# Patient Record
Sex: Female | Born: 1955 | Race: Black or African American | Hispanic: No | Marital: Married | Smoking: Never smoker
Health system: Southern US, Community
[De-identification: ages and names within clinical notes are randomized; demographics above are authoritative.]

## PROBLEM LIST (undated history)

## (undated) ENCOUNTER — Emergency Department (HOSPITAL_BASED_OUTPATIENT_CLINIC_OR_DEPARTMENT_OTHER): Admission: EM | Payer: Medicare PPO | Source: Home / Self Care

## (undated) ENCOUNTER — Ambulatory Visit (HOSPITAL_COMMUNITY): Payer: Medicare PPO

## (undated) DIAGNOSIS — F410 Panic disorder [episodic paroxysmal anxiety] without agoraphobia: Secondary | ICD-10-CM

## (undated) DIAGNOSIS — Z8619 Personal history of other infectious and parasitic diseases: Secondary | ICD-10-CM

## (undated) DIAGNOSIS — G43909 Migraine, unspecified, not intractable, without status migrainosus: Secondary | ICD-10-CM

## (undated) DIAGNOSIS — Z972 Presence of dental prosthetic device (complete) (partial): Secondary | ICD-10-CM

## (undated) DIAGNOSIS — B029 Zoster without complications: Secondary | ICD-10-CM

## (undated) DIAGNOSIS — K259 Gastric ulcer, unspecified as acute or chronic, without hemorrhage or perforation: Secondary | ICD-10-CM

## (undated) DIAGNOSIS — E559 Vitamin D deficiency, unspecified: Secondary | ICD-10-CM

## (undated) DIAGNOSIS — K219 Gastro-esophageal reflux disease without esophagitis: Secondary | ICD-10-CM

## (undated) DIAGNOSIS — R51 Headache: Secondary | ICD-10-CM

## (undated) DIAGNOSIS — T7840XA Allergy, unspecified, initial encounter: Secondary | ICD-10-CM

## (undated) DIAGNOSIS — R519 Headache, unspecified: Secondary | ICD-10-CM

## (undated) DIAGNOSIS — F419 Anxiety disorder, unspecified: Secondary | ICD-10-CM

## (undated) DIAGNOSIS — J40 Bronchitis, not specified as acute or chronic: Secondary | ICD-10-CM

## (undated) DIAGNOSIS — J45909 Unspecified asthma, uncomplicated: Secondary | ICD-10-CM

## (undated) HISTORY — DX: Unspecified asthma, uncomplicated: J45.909

## (undated) HISTORY — PX: COLONOSCOPY: SHX174

## (undated) HISTORY — DX: Personal history of other infectious and parasitic diseases: Z86.19

## (undated) HISTORY — DX: Gastric ulcer, unspecified as acute or chronic, without hemorrhage or perforation: K25.9

## (undated) HISTORY — DX: Anxiety disorder, unspecified: F41.9

## (undated) HISTORY — PX: BUNIONECTOMY: SHX129

## (undated) HISTORY — PX: UPPER GI ENDOSCOPY: SHX6162

## (undated) HISTORY — PX: IUD REMOVAL: SHX5392

## (undated) HISTORY — PX: APPENDECTOMY: SHX54

## (undated) HISTORY — DX: Allergy, unspecified, initial encounter: T78.40XA

## (undated) HISTORY — DX: Migraine, unspecified, not intractable, without status migrainosus: G43.909

## (undated) HISTORY — DX: Panic disorder (episodic paroxysmal anxiety): F41.0

## (undated) HISTORY — PX: TUBAL LIGATION: SHX77

## (undated) HISTORY — DX: Vitamin D deficiency, unspecified: E55.9

## (undated) HISTORY — PX: ENDOMETRIAL BIOPSY: SHX622

---

## 1997-09-14 ENCOUNTER — Emergency Department (HOSPITAL_COMMUNITY): Admission: EM | Admit: 1997-09-14 | Discharge: 1997-09-14 | Payer: Self-pay | Admitting: Emergency Medicine

## 2002-01-01 ENCOUNTER — Other Ambulatory Visit: Admission: RE | Admit: 2002-01-01 | Discharge: 2002-01-01 | Payer: Self-pay | Admitting: Obstetrics and Gynecology

## 2003-03-06 DIAGNOSIS — F410 Panic disorder [episodic paroxysmal anxiety] without agoraphobia: Secondary | ICD-10-CM

## 2003-03-06 HISTORY — DX: Panic disorder (episodic paroxysmal anxiety): F41.0

## 2004-01-13 ENCOUNTER — Other Ambulatory Visit: Admission: RE | Admit: 2004-01-13 | Discharge: 2004-01-13 | Payer: Self-pay | Admitting: Obstetrics and Gynecology

## 2004-05-17 ENCOUNTER — Ambulatory Visit: Payer: Self-pay | Admitting: Internal Medicine

## 2004-05-19 ENCOUNTER — Ambulatory Visit: Payer: Self-pay | Admitting: Internal Medicine

## 2004-06-08 ENCOUNTER — Ambulatory Visit: Payer: Self-pay | Admitting: Internal Medicine

## 2006-04-18 ENCOUNTER — Inpatient Hospital Stay (HOSPITAL_COMMUNITY): Admission: AD | Admit: 2006-04-18 | Discharge: 2006-04-18 | Payer: Self-pay | Admitting: Obstetrics and Gynecology

## 2008-05-14 ENCOUNTER — Emergency Department (HOSPITAL_COMMUNITY): Admission: EM | Admit: 2008-05-14 | Discharge: 2008-05-14 | Payer: Self-pay | Admitting: Emergency Medicine

## 2008-11-28 ENCOUNTER — Ambulatory Visit: Payer: Self-pay | Admitting: Family Medicine

## 2008-11-28 ENCOUNTER — Observation Stay (HOSPITAL_COMMUNITY): Admission: EM | Admit: 2008-11-28 | Discharge: 2008-11-29 | Payer: Self-pay | Admitting: Emergency Medicine

## 2009-06-10 ENCOUNTER — Ambulatory Visit (HOSPITAL_COMMUNITY): Admission: RE | Admit: 2009-06-10 | Discharge: 2009-06-10 | Payer: Self-pay | Admitting: Obstetrics and Gynecology

## 2010-05-08 ENCOUNTER — Other Ambulatory Visit (HOSPITAL_COMMUNITY): Payer: Self-pay | Admitting: Obstetrics and Gynecology

## 2010-05-08 DIAGNOSIS — Z1231 Encounter for screening mammogram for malignant neoplasm of breast: Secondary | ICD-10-CM

## 2010-06-09 LAB — CBC
HCT: 36.7 % (ref 36.0–46.0)
HCT: 37.8 % (ref 36.0–46.0)
Hemoglobin: 12.3 g/dL (ref 12.0–15.0)
MCHC: 33.2 g/dL (ref 30.0–36.0)
MCHC: 33.5 g/dL (ref 30.0–36.0)
MCV: 83.6 fL (ref 78.0–100.0)
MCV: 84.6 fL (ref 78.0–100.0)
Platelets: 306 10*3/uL (ref 150–400)
Platelets: 332 10*3/uL (ref 150–400)
RBC: 4.52 MIL/uL (ref 3.87–5.11)
RDW: 15.3 % (ref 11.5–15.5)
RDW: 15.5 % (ref 11.5–15.5)
WBC: 8.1 10*3/uL (ref 4.0–10.5)
WBC: 8.4 10*3/uL (ref 4.0–10.5)

## 2010-06-09 LAB — BASIC METABOLIC PANEL
Calcium: 8.7 mg/dL (ref 8.4–10.5)
Calcium: 8.7 mg/dL (ref 8.4–10.5)
Chloride: 105 mEq/L (ref 96–112)
Creatinine, Ser: 0.72 mg/dL (ref 0.4–1.2)
GFR calc non Af Amer: >60 mL/min (ref >60)
GFR calc non Af Amer: >60 mL/min (ref >60)
Glucose, Bld: 99 mg/dL (ref 70–99)

## 2010-06-09 LAB — LIPID PANEL
Cholesterol: 154 mg/dL (ref 0–200)
LDL Cholesterol: 110 mg/dL — ABNORMAL HIGH (ref 0–99)
Total CHOL/HDL Ratio: 4.7 RATIO
Triglycerides: 54 mg/dL (ref <150)

## 2010-06-09 LAB — CARDIAC PANEL(CRET KIN+CKTOT+MB+TROPI)
CK, MB: 0.5 ng/mL (ref 0.3–4.0)
CK, MB: 0.8 ng/mL (ref 0.3–4.0)
Total CK: 76 U/L (ref 7–177)
Total CK: 90 U/L (ref 7–177)

## 2010-06-09 LAB — DIFFERENTIAL
Eosinophils Relative: 1 % (ref 0–5)
Lymphs Abs: 1.8 10*3/uL (ref 0.7–4.0)
Neutro Abs: 5.8 10*3/uL (ref 1.7–7.7)
Neutrophils Relative %: 71 % (ref 43–77)

## 2010-06-09 LAB — CK TOTAL AND CKMB (NOT AT ARMC)
CK, MB: 0.9 ng/mL (ref 0.3–4.0)
Relative Index: INVALID (ref 0.0–2.5)
Total CK: 87 U/L (ref 7–177)

## 2010-06-09 LAB — POCT CARDIAC MARKERS
Myoglobin, poc: 55.3 ng/mL (ref 12–200)
Troponin i, poc: <0.05 ng/mL (ref 0.00–0.09)

## 2010-06-12 ENCOUNTER — Ambulatory Visit (HOSPITAL_COMMUNITY)
Admission: RE | Admit: 2010-06-12 | Discharge: 2010-06-12 | Disposition: A | Discharge status: Home / Self Care | Payer: BC Managed Care – PPO | Source: Ambulatory Visit | Attending: Obstetrics and Gynecology | Admitting: Obstetrics and Gynecology

## 2010-06-12 DIAGNOSIS — Z1231 Encounter for screening mammogram for malignant neoplasm of breast: Secondary | ICD-10-CM | POA: Insufficient documentation

## 2010-07-20 ENCOUNTER — Emergency Department (HOSPITAL_COMMUNITY)
Admission: EM | Admit: 2010-07-20 | Discharge: 2010-07-20 | Disposition: A | Discharge status: Home / Self Care | Payer: BC Managed Care – PPO | Attending: Emergency Medicine | Admitting: Emergency Medicine

## 2010-07-20 ENCOUNTER — Emergency Department (HOSPITAL_COMMUNITY): Payer: BC Managed Care – PPO

## 2010-07-20 DIAGNOSIS — R110 Nausea: Secondary | ICD-10-CM | POA: Insufficient documentation

## 2010-07-20 DIAGNOSIS — R071 Chest pain on breathing: Secondary | ICD-10-CM | POA: Insufficient documentation

## 2010-07-20 LAB — DIFFERENTIAL
Basophils Absolute: 0 10*3/uL (ref 0.0–0.1)
Neutro Abs: 6.6 10*3/uL (ref 1.7–7.7)
Neutrophils Relative %: 69 % (ref 43–77)

## 2010-07-20 LAB — COMPREHENSIVE METABOLIC PANEL
ALT: 19 U/L (ref 0–35)
Albumin: 3.7 g/dL (ref 3.5–5.2)
CO2: 29 mEq/L (ref 19–32)
Calcium: 9.5 mg/dL (ref 8.4–10.5)
Chloride: 100 mEq/L (ref 96–112)
Creatinine, Ser: 0.56 mg/dL (ref 0.4–1.2)
GFR calc Af Amer: >60 mL/min (ref >60)
GFR calc non Af Amer: >60 mL/min (ref >60)
Glucose, Bld: 89 mg/dL (ref 70–99)
Sodium: 137 mEq/L (ref 135–145)

## 2010-07-20 LAB — POCT CARDIAC MARKERS: Troponin i, poc: <0.05 ng/mL (ref 0.00–0.09)

## 2010-07-20 LAB — CBC
Platelets: 354 10*3/uL (ref 150–400)
RBC: 4.79 MIL/uL (ref 3.87–5.11)
RDW: 14.7 % (ref 11.5–15.5)

## 2010-11-28 ENCOUNTER — Emergency Department (HOSPITAL_COMMUNITY): Payer: BC Managed Care – PPO

## 2010-11-28 ENCOUNTER — Emergency Department (HOSPITAL_COMMUNITY)
Admission: EM | Admit: 2010-11-28 | Discharge: 2010-11-28 | Disposition: A | Discharge status: Home / Self Care | Payer: BC Managed Care – PPO | Attending: Emergency Medicine | Admitting: Emergency Medicine

## 2010-11-28 DIAGNOSIS — R109 Unspecified abdominal pain: Secondary | ICD-10-CM | POA: Insufficient documentation

## 2010-11-28 DIAGNOSIS — R112 Nausea with vomiting, unspecified: Secondary | ICD-10-CM | POA: Insufficient documentation

## 2010-11-28 DIAGNOSIS — N200 Calculus of kidney: Secondary | ICD-10-CM | POA: Insufficient documentation

## 2010-11-28 LAB — URINALYSIS, ROUTINE W REFLEX MICROSCOPIC
Bilirubin Urine: NEGATIVE
Glucose, UA: NEGATIVE mg/dL
Ketones, ur: NEGATIVE mg/dL
Leukocytes, UA: NEGATIVE
Protein, ur: NEGATIVE mg/dL
pH: 8.5 — ABNORMAL HIGH (ref 5.0–8.0)

## 2010-11-28 LAB — DIFFERENTIAL
Basophils Relative: 0 % (ref 0–1)
Monocytes Absolute: 0.5 10*3/uL (ref 0.1–1.0)
Monocytes Relative: 4 % (ref 3–12)
Neutro Abs: 9 10*3/uL — ABNORMAL HIGH (ref 1.7–7.7)

## 2010-11-28 LAB — CBC
HCT: 38.6 % (ref 36.0–46.0)
Hemoglobin: 12.7 g/dL (ref 12.0–15.0)
MCH: 27.4 pg (ref 26.0–34.0)
RDW: 15.1 % (ref 11.5–15.5)

## 2010-11-28 LAB — POCT I-STAT, CHEM 8
BUN: 5 mg/dL — ABNORMAL LOW (ref 6–23)
Calcium, Ion: 1.26 mmol/L (ref 1.12–1.32)
Chloride: 104 mEq/L (ref 96–112)
Creatinine, Ser: 0.9 mg/dL (ref 0.50–1.10)
Glucose, Bld: 89 mg/dL (ref 70–99)
TCO2: 27 mmol/L (ref 0–100)

## 2010-11-28 LAB — URINE MICROSCOPIC-ADD ON

## 2010-11-28 MED ORDER — IOHEXOL 300 MG/ML  SOLN
80.0000 mL | Freq: Once | INTRAMUSCULAR | Status: AC | PRN
  Administered 2010-11-28: 80 mL via INTRAVENOUS

## 2011-07-04 ENCOUNTER — Ambulatory Visit: Payer: Self-pay | Admitting: Obstetrics and Gynecology

## 2011-07-12 ENCOUNTER — Encounter: Payer: Self-pay | Admitting: Obstetrics and Gynecology

## 2011-07-12 ENCOUNTER — Ambulatory Visit (INDEPENDENT_AMBULATORY_CARE_PROVIDER_SITE_OTHER): Payer: BC Managed Care – PPO | Admitting: Obstetrics and Gynecology

## 2011-07-12 VITALS — BP 122/64 | Ht 64.0 in | Wt 202.0 lb

## 2011-07-12 DIAGNOSIS — L909 Atrophic disorder of skin, unspecified: Secondary | ICD-10-CM

## 2011-07-12 DIAGNOSIS — Z139 Encounter for screening, unspecified: Secondary | ICD-10-CM

## 2011-07-12 DIAGNOSIS — Z01419 Encounter for gynecological examination (general) (routine) without abnormal findings: Secondary | ICD-10-CM

## 2011-07-12 DIAGNOSIS — L918 Other hypertrophic disorders of the skin: Secondary | ICD-10-CM

## 2011-07-12 DIAGNOSIS — Z124 Encounter for screening for malignant neoplasm of cervix: Secondary | ICD-10-CM

## 2011-07-12 LAB — TSH: TSH: 1.903 u[IU]/mL (ref 0.350–4.500)

## 2011-07-12 LAB — CBC
MCH: 26.8 pg (ref 26.0–34.0)
MCHC: 31.3 g/dL (ref 30.0–36.0)
MCV: 85.6 fL (ref 78.0–100.0)
Platelets: 385 10*3/uL (ref 150–400)
RBC: 4.66 MIL/uL (ref 3.87–5.11)

## 2011-07-12 NOTE — Progress Notes (Signed)
Last Pap: 07/2010  WNL: Yes Regular Periods:no Contraception: None   Monthly Breast exam:yes Tetanus<46yrs:yes Nl.Bladder Function:yes Daily BMs:yes Healthy Diet:yes Calcium:yes Mammogram:no Exercise:no Seatbelt: yes Abuse at home: no Stressful work:yes Sigmoid-colonoscopy: yesWNL Bone Density: No  C/o skin tags on perineum, wants them removed and c/o wt gain.  Filed Vitals:   07/12/11 1445  BP: 122/64   ROS: noncontributory  Physical Examination: General appearance - alert, well appearing, and in no distress Neck - supple, no significant adenopathy Chest - clear to auscultation, no wheezes, rales or rhonchi, symmetric air entry Heart - normal rate and regular rhythm Abdomen - soft, nontender, nondistended, no masses or organomegaly Breasts - breasts appear normal, no suspicious masses, no skin or nipple changes or axillary nodes Pelvic - normal external genitalia, vulva, vagina, cervix, uterus and adnexa, skin tags on rt buttock x 2 Back exam - no CVAT Extremities - no edema, redness or tenderness in the calves or thighs  A/P Pap sched f/u for removal of skin tags Wt loss discussed  Check labs - TSH, CBC, Vit D today Lab visit for Fasting glucose and lipid panel sched mammo

## 2011-07-16 LAB — PAP IG W/ RFLX HPV ASCU

## 2011-07-18 ENCOUNTER — Other Ambulatory Visit: Payer: BC Managed Care – PPO

## 2011-07-19 ENCOUNTER — Other Ambulatory Visit: Payer: Self-pay

## 2011-07-19 ENCOUNTER — Telehealth: Payer: Self-pay

## 2011-07-19 DIAGNOSIS — D72829 Elevated white blood cell count, unspecified: Secondary | ICD-10-CM

## 2011-07-19 NOTE — Telephone Encounter (Signed)
Pt was called and given Vit-D protocol. Pt was also advised of Elevated WBC and advised to return in 4 wks for re-draw. RX was called to [facility] pharmacy [phone] for Vit-D soft jels 50,000 units 1 cap 1 x wk  For 12 wks #20 0rf. Future labs will be ordered. Kelly Abbott

## 2011-07-25 ENCOUNTER — Ambulatory Visit (INDEPENDENT_AMBULATORY_CARE_PROVIDER_SITE_OTHER): Payer: BC Managed Care – PPO | Admitting: Obstetrics and Gynecology

## 2011-07-25 ENCOUNTER — Other Ambulatory Visit: Payer: BC Managed Care – PPO

## 2011-07-25 ENCOUNTER — Encounter: Payer: Self-pay | Admitting: Obstetrics and Gynecology

## 2011-07-25 VITALS — BP 106/64 | Ht 64.0 in | Wt 201.0 lb

## 2011-07-25 DIAGNOSIS — D72829 Elevated white blood cell count, unspecified: Secondary | ICD-10-CM

## 2011-07-25 DIAGNOSIS — L909 Atrophic disorder of skin, unspecified: Secondary | ICD-10-CM

## 2011-07-25 DIAGNOSIS — L918 Other hypertrophic disorders of the skin: Secondary | ICD-10-CM

## 2011-07-25 DIAGNOSIS — Z139 Encounter for screening, unspecified: Secondary | ICD-10-CM

## 2011-07-25 LAB — CBC
HCT: 38 % (ref 36.0–46.0)
Platelets: 411 10*3/uL — ABNORMAL HIGH (ref 150–400)
RBC: 4.62 MIL/uL (ref 3.87–5.11)
RDW: 14.9 % (ref 11.5–15.5)
WBC: 8.4 10*3/uL (ref 4.0–10.5)

## 2011-07-25 LAB — LIPID PANEL
HDL: 45 mg/dL (ref >39)
LDL Cholesterol: 110 mg/dL — ABNORMAL HIGH (ref 0–99)
Total CHOL/HDL Ratio: 3.8 Ratio

## 2011-07-25 NOTE — Progress Notes (Signed)
Patient here for removal of skin tags on her perineum She also reports a history of elevated white blood cell count from her last visit  Filed Vitals:   07/25/11 0956  BP: 106/64   Perineum was prepped and injected with a total of approximately 2-3 cc of 1% for lidocaine The skin tags were excised and sent to pathology One was approximately 5 mm and the other b/n 5mm and 1cm  Assessment and plan Procedure tolerated well We check CBC at next visit Return to office in 2 weeks for followup

## 2011-07-25 NOTE — Progress Notes (Signed)
Addended by: Marla Roe A on: 07/25/2011 12:40 PM   Modules accepted: Orders

## 2011-07-26 ENCOUNTER — Telehealth: Payer: Self-pay

## 2011-07-26 NOTE — Progress Notes (Signed)
Dr. Su Hilt, protocol for Vit-d has already been done and pt does not have a PCP to forward Vit-d results to. Kelly Abbott

## 2011-07-26 NOTE — Telephone Encounter (Signed)
Given protocol to send Vit-d results to pt's PCP, pt stated that she does not have a PCP. Kelly Abbott

## 2011-07-27 LAB — PATHOLOGY

## 2011-08-13 DIAGNOSIS — E559 Vitamin D deficiency, unspecified: Secondary | ICD-10-CM | POA: Insufficient documentation

## 2011-08-13 DIAGNOSIS — F410 Panic disorder [episodic paroxysmal anxiety] without agoraphobia: Secondary | ICD-10-CM | POA: Insufficient documentation

## 2011-08-14 ENCOUNTER — Other Ambulatory Visit: Payer: BC Managed Care – PPO

## 2011-08-14 ENCOUNTER — Encounter: Payer: BC Managed Care – PPO | Admitting: Obstetrics and Gynecology

## 2012-05-01 HISTORY — PX: OTHER SURGICAL HISTORY: SHX169

## 2012-05-01 HISTORY — PX: BUNIONECTOMY: SHX129

## 2012-05-29 ENCOUNTER — Encounter: Payer: Self-pay | Admitting: *Deleted

## 2012-06-03 ENCOUNTER — Ambulatory Visit (INDEPENDENT_AMBULATORY_CARE_PROVIDER_SITE_OTHER): Payer: BC Managed Care – PPO | Admitting: Podiatry

## 2012-06-03 ENCOUNTER — Encounter: Payer: Self-pay | Admitting: Podiatry

## 2012-06-03 VITALS — BP 123/75 | HR 76 | Ht 63.5 in | Wt 205.0 lb

## 2012-06-03 DIAGNOSIS — M21969 Unspecified acquired deformity of unspecified lower leg: Secondary | ICD-10-CM

## 2012-06-03 DIAGNOSIS — M21961 Unspecified acquired deformity of right lower leg: Secondary | ICD-10-CM

## 2012-06-03 NOTE — Progress Notes (Signed)
5 weeks post op. S/P Biplane Austine right. Patient feels tenderness under 1st MPJ area.  This is normal since tight plantar capsule has been released and metatarsal head was plantar flexed.  Incision is well healed and coapt. Minimum edema. Range of motion is decreased since last visit. Need to continue exercise joint to increase mobility. Will do final post op x-ray on next visit.  Return in 3 weeks.

## 2012-06-03 NOTE — Patient Instructions (Addendum)
5 weeks since surgery. Progressing well. Still need to exercise joint up and down daily. Use cream over incision area. Keep compression stockinet during the day. Stay in Surgical shoe or in Tennis shoe. Return in 3 weeks.

## 2012-06-24 ENCOUNTER — Ambulatory Visit (INDEPENDENT_AMBULATORY_CARE_PROVIDER_SITE_OTHER): Payer: BC Managed Care – PPO | Admitting: Podiatry

## 2012-06-24 VITALS — BP 129/74 | HR 79 | Ht 64.0 in | Wt 202.0 lb

## 2012-06-24 DIAGNOSIS — M21969 Unspecified acquired deformity of unspecified lower leg: Secondary | ICD-10-CM

## 2012-06-24 DIAGNOSIS — M25579 Pain in unspecified ankle and joints of unspecified foot: Secondary | ICD-10-CM

## 2012-06-24 DIAGNOSIS — M21961 Unspecified acquired deformity of right lower leg: Secondary | ICD-10-CM

## 2012-06-24 DIAGNOSIS — M25571 Pain in right ankle and joints of right foot: Secondary | ICD-10-CM

## 2012-06-24 DIAGNOSIS — Z9889 Other specified postprocedural states: Secondary | ICD-10-CM

## 2012-06-24 NOTE — Progress Notes (Signed)
Subjective:  8 weeks post bunionectomy right.  Patient has been in Tennis shoes x 2 weeks. Been back to work. Has sitting job. Does not do exercise.   Objective:  1st MPJ RON limited right. No edema noted.  Need to exercise joint range of motion more actively.  8 week post op Right foot X-ray done. Finding is consistent with surgery performed. First ray is in rectus, with maintained correction. Osteotomy site in good approximation. Internal fixation screw and pin in place.   Assessment:  Satisfactory post op progress from bunion surgery right foot with good bone healing.   Plan: Will have final check in 1 month.  Do more active range of motion of the first MPJ as well as walking exercise.

## 2012-07-23 ENCOUNTER — Ambulatory Visit (INDEPENDENT_AMBULATORY_CARE_PROVIDER_SITE_OTHER): Payer: BC Managed Care – PPO | Admitting: Podiatry

## 2012-07-23 DIAGNOSIS — Z9889 Other specified postprocedural states: Secondary | ICD-10-CM

## 2012-07-24 NOTE — Progress Notes (Signed)
Status post 3 months Bunion surgery. Patient is doing well with some occasional tenderness under the big joint. O: Rectus foot with good and improved range of motion at the first MPJ. Well healed surgical sound.  A:  Satisfactory wound healing with occasional tenderness under 1st MPJ due to plantar capsular release. P:  Reviewed clinical findings. Patient is to continue to increase activity. Return as needed.

## 2012-09-02 ENCOUNTER — Ambulatory Visit (INDEPENDENT_AMBULATORY_CARE_PROVIDER_SITE_OTHER): Payer: BC Managed Care – PPO | Admitting: Internal Medicine

## 2012-09-02 ENCOUNTER — Ambulatory Visit: Payer: BC Managed Care – PPO

## 2012-09-02 VITALS — BP 131/83 | HR 73 | Temp 98.0°F | Resp 16 | Ht 64.0 in | Wt 205.0 lb

## 2012-09-02 DIAGNOSIS — M5412 Radiculopathy, cervical region: Secondary | ICD-10-CM

## 2012-09-02 DIAGNOSIS — R071 Chest pain on breathing: Secondary | ICD-10-CM

## 2012-09-02 DIAGNOSIS — Z6835 Body mass index (BMI) 35.0-35.9, adult: Secondary | ICD-10-CM | POA: Insufficient documentation

## 2012-09-02 DIAGNOSIS — R0789 Other chest pain: Secondary | ICD-10-CM

## 2012-09-02 MED ORDER — MELOXICAM 15 MG PO TABS: 15.0000 mg | ORAL_TABLET | Freq: Every day | ORAL | Status: DC

## 2012-09-02 MED ORDER — PREDNISONE 20 MG PO TABS: 20.0000 mg | ORAL_TABLET | Freq: Every day | ORAL | Status: DC

## 2012-09-02 MED ORDER — CYCLOBENZAPRINE HCL 10 MG PO TABS: 10.0000 mg | ORAL_TABLET | Freq: Every day | ORAL | Status: DC

## 2012-09-02 NOTE — Progress Notes (Addendum)
  Subjective:    Patient ID: Kelly Abbott, female    DOB: 1955-06-01, 57 y.o.   MRN: [id number]  HPI  Presents with chest pain and left arm pain x 3 days. Located in left chest, entire length left arm, and left side of neck. Pain is constant, described as "deep in my bones", keeps her awake at night. Occasionally shooting pain radiates down the arm and down the spine.This morning pain started in right arm as well. Occasional numbness/tingling in hands/fingers. States she feels weaker in her arms than previously. Was evaluated for similar problem "several years ago" at [facility] and sent to ED. ED believes anxiety was cause. Denies: diaphoresis, dizziness, SOB. Moving shoulder/neck can worsen the pain.   Denies hx of HTN, dyslipidemia, DM. Has multiple 1st degree family members with known heart disease, MIs.   Review of Systems Denies headache, SOB, abdominal pain, swelling in extremities.     Objective:   Physical Exam  BP 131/83  Pulse 73  Temp(Src) 98 F (36.7 C) (Oral)  Resp 16  Ht 5\' 4"  (1.626 m)  Wt 205 lb (92.987 kg)  BMI 35.17 kg/m2  SpO2 98%  General: WDWN female, appears stated age, NAD HEENT: normocephalic, atraumatic  Resp: CTA bilaterally, good air entry, without rales rhonchi wheezes Cardiac: RRR, no murmurs rubs gallops, chest wall pain is reproducible with light palpation  Extremities: neck extension causes pain, shoulder ROM causes pain, 5/5 strength in UE & LE, reflexes 2+ biceps and patellar bilaterally Neck with a kyphotic posture exaggeration and with restricted range of motion secondary to discomfort Neuro: A&O x 3, CN II-XII grossly intact/D. Reflexes symmetrical in the upper extremities Skin: no rashes lesions      [facility] reading (PRIMARY) by  Dr. 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 Kelly Abbott, M.D.

## 2012-09-22 ENCOUNTER — Other Ambulatory Visit: Payer: Self-pay | Admitting: Obstetrics and Gynecology

## 2012-09-22 DIAGNOSIS — Z1231 Encounter for screening mammogram for malignant neoplasm of breast: Secondary | ICD-10-CM

## 2012-10-01 ENCOUNTER — Ambulatory Visit (HOSPITAL_COMMUNITY)
Admission: RE | Admit: 2012-10-01 | Discharge: 2012-10-01 | Disposition: A | Discharge status: Home / Self Care | Payer: BC Managed Care – PPO | Source: Ambulatory Visit | Attending: Obstetrics and Gynecology | Admitting: Obstetrics and Gynecology

## 2012-10-01 DIAGNOSIS — Z1231 Encounter for screening mammogram for malignant neoplasm of breast: Secondary | ICD-10-CM | POA: Insufficient documentation

## 2012-10-31 ENCOUNTER — Telehealth: Payer: Self-pay | Admitting: *Deleted

## 2012-10-31 MED ORDER — IBUPROFEN 800 MG PO TABS: 800.0000 mg | ORAL_TABLET | Freq: Three times a day (TID) | ORAL | Status: DC | PRN

## 2012-10-31 NOTE — Telephone Encounter (Signed)
Pt stated her foot was swollen for last two days and requested Ibuprofen refill to be called in.

## 2012-12-01 ENCOUNTER — Ambulatory Visit (INDEPENDENT_AMBULATORY_CARE_PROVIDER_SITE_OTHER): Payer: BC Managed Care – PPO | Admitting: Emergency Medicine

## 2012-12-01 ENCOUNTER — Ambulatory Visit: Payer: BC Managed Care – PPO

## 2012-12-01 VITALS — BP 140/80 | HR 78 | Temp 98.3°F | Resp 16 | Ht 64.0 in | Wt 207.0 lb

## 2012-12-01 DIAGNOSIS — M549 Dorsalgia, unspecified: Secondary | ICD-10-CM

## 2012-12-01 DIAGNOSIS — M5412 Radiculopathy, cervical region: Secondary | ICD-10-CM

## 2012-12-01 DIAGNOSIS — S139XXA Sprain of joints and ligaments of unspecified parts of neck, initial encounter: Secondary | ICD-10-CM

## 2012-12-01 MED ORDER — CYCLOBENZAPRINE HCL 10 MG PO TABS: 10.0000 mg | ORAL_TABLET | Freq: Every day | ORAL | Status: DC

## 2012-12-01 MED ORDER — NAPROXEN SODIUM 550 MG PO TABS: 550.0000 mg | ORAL_TABLET | Freq: Two times a day (BID) | ORAL | Status: DC

## 2012-12-01 NOTE — Patient Instructions (Addendum)

## 2012-12-01 NOTE — Progress Notes (Signed)
[facility] [address], [place] [address] [phone]  Date:  12/01/2012   Name:  Kelly Abbott   DOB:  December 21, 1955   MRN:  [id number]  PCP:  Purcell Nails, MD    Chief Complaint: Back Pain   History of Present Illness:  Kelly Abbott is a 57 y.o. very pleasant female patient who presents with the following:  Injured when she slipped on a treadmill and fell backwards landing on the frame of the treadmill.  She has pain across her shoulders. And mid back.  Denies any neuro symptoms or weakness.  No radition.  No improvement with over the counter medications or other home remedies. Denies other complaint or health concern today.   Patient Active Problem List   Diagnosis Date Noted  . BMI 35.0-35.9,adult 09/02/2012  . Status post foot joint surgery 06/24/2012  . 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 Vitamin d deficiency   . Panic attacks     Past Medical History  Diagnosis Date  . Vitamin D deficiency   . History of chicken pox   . H/O mumps   . H/O measles   . Panic attacks 2005  . Allergy     Past Surgical History  Procedure Laterality Date  . Appendectomy    . Tubal ligation    . Bunionectomy Right 05/01/12    [place], right foot  . Remove implant deep Right 05/01/12/    Right foot    History  Substance Use Topics  . Smoking status: Never Smoker   . Smokeless tobacco: Never Used  . Alcohol Use: No    Family History  Problem Relation Age of Onset  . Hypertension Mother   . Diabetes Mother   . Heart disease Sister   . Hypertension Sister   . Hypertension Brother   . Heart disease Maternal Aunt   . Cancer Maternal Grandmother     Allergies  Allergen Reactions  . Asa [Aspirin]   . Codeine Hives    Medication list has been reviewed and updated.  Current Outpatient Prescriptions on File Prior to Visit  Medication Sig Dispense Refill  . ibuprofen (ADVIL,MOTRIN) 800 MG tablet Take 1 tablet (800 mg total)  by mouth every 8 (eight) hours as needed for pain.  60 tablet  0  . Ascorbic Acid (VITAMIN C) 1000 MG tablet Take 1,000 mg by mouth daily.      . cyclobenzaprine (FLEXERIL) 10 MG tablet Take 1 tablet (10 mg total) by mouth at bedtime.  30 tablet  0  . fish oil-omega-3 fatty acids 1000 MG capsule Take 2 g by mouth daily.      . meloxicam (MOBIC) 15 MG tablet Take 1 tablet (15 mg total) by mouth daily.  30 tablet  0  . predniSONE (DELTASONE) 20 MG tablet Take 1 tablet (20 mg total) by mouth daily. 3/3/2/2/1/1 single daily dose for 6 days  12 tablet  0   No current facility-administered medications on file prior to visit.    Review of Systems:  As per HPI, otherwise negative.    Physical Examination: Filed Vitals:   12/01/12 1122  BP: 140/80  Pulse: 78  Temp: 98.3 F (36.8 C)  Resp: 16   Filed Vitals:   12/01/12 1122  Height: 5\' 4"  (1.626 m)  Weight: 207 lb (93.895 kg)   Body mass index is 35.51 kg/(m^2). Ideal Body Weight: Weight in (lb) to have BMI = 25: 145.3   GEN: WDWN, NAD, Non-toxic,  Alert & Oriented x 3 HEENT: Atraumatic, Normocephalic.  Ears and Nose: No external deformity. EXTR: No clubbing/cyanosis/edema NEURO: Normal gait.  PSYCH: Normally interactive. Conversant. Not depressed or anxious appearing.  Calm demeanor.  Trapezius muscle tender across shoulders.  No ecchymosis or deformith. BACK:  Tender dorsal spine. No step off  Assessment and Plan: Cervical strain Anaprox Flexeril   Signed,  Phillips Odor, MD   [facility] reading (PRIMARY) by  Dr. Dareen Piano.  CSPINE:  Mild degenerative arthritis .  [facility] reading (PRIMARY) by  Dr. Dareen Piano.  TSPINE:  negative.

## 2012-12-31 ENCOUNTER — Encounter (INDEPENDENT_AMBULATORY_CARE_PROVIDER_SITE_OTHER): Payer: BC Managed Care – PPO | Admitting: Ophthalmology

## 2012-12-31 DIAGNOSIS — H43819 Vitreous degeneration, unspecified eye: Secondary | ICD-10-CM

## 2012-12-31 DIAGNOSIS — H2510 Age-related nuclear cataract, unspecified eye: Secondary | ICD-10-CM

## 2012-12-31 DIAGNOSIS — H3540 Unspecified peripheral retinal degeneration: Secondary | ICD-10-CM

## 2013-01-20 ENCOUNTER — Other Ambulatory Visit: Payer: Self-pay | Admitting: Gastroenterology

## 2013-01-20 DIAGNOSIS — K219 Gastro-esophageal reflux disease without esophagitis: Secondary | ICD-10-CM

## 2013-01-20 DIAGNOSIS — R110 Nausea: Secondary | ICD-10-CM

## 2013-01-20 DIAGNOSIS — R1033 Periumbilical pain: Secondary | ICD-10-CM

## 2013-02-10 ENCOUNTER — Ambulatory Visit (HOSPITAL_COMMUNITY): Payer: BC Managed Care – PPO

## 2013-02-16 ENCOUNTER — Ambulatory Visit (HOSPITAL_COMMUNITY)
Admission: RE | Admit: 2013-02-16 | Discharge: 2013-02-16 | Disposition: A | Discharge status: Home / Self Care | Payer: BC Managed Care – PPO | Source: Ambulatory Visit | Attending: Gastroenterology | Admitting: Gastroenterology

## 2013-02-16 ENCOUNTER — Encounter (HOSPITAL_COMMUNITY)
Admission: RE | Admit: 2013-02-16 | Discharge: 2013-02-16 | Disposition: A | Discharge status: Home / Self Care | Payer: BC Managed Care – PPO | Source: Ambulatory Visit | Attending: Gastroenterology | Admitting: Gastroenterology

## 2013-02-16 DIAGNOSIS — R1033 Periumbilical pain: Secondary | ICD-10-CM

## 2013-02-16 DIAGNOSIS — R110 Nausea: Secondary | ICD-10-CM | POA: Insufficient documentation

## 2013-02-16 DIAGNOSIS — K219 Gastro-esophageal reflux disease without esophagitis: Secondary | ICD-10-CM

## 2013-02-16 DIAGNOSIS — K7689 Other specified diseases of liver: Secondary | ICD-10-CM | POA: Insufficient documentation

## 2013-02-16 MED ORDER — TECHNETIUM TC 99M MEBROFENIN IV KIT
5.0000 | PACK | Freq: Once | INTRAVENOUS | Status: AC | PRN
  Administered 2013-02-16: 5 via INTRAVENOUS

## 2013-02-24 ENCOUNTER — Encounter (INDEPENDENT_AMBULATORY_CARE_PROVIDER_SITE_OTHER): Payer: Self-pay | Admitting: Surgery

## 2013-03-11 ENCOUNTER — Encounter (INDEPENDENT_AMBULATORY_CARE_PROVIDER_SITE_OTHER): Payer: Self-pay

## 2013-03-11 ENCOUNTER — Ambulatory Visit (INDEPENDENT_AMBULATORY_CARE_PROVIDER_SITE_OTHER): Payer: BC Managed Care – PPO | Admitting: Surgery

## 2013-03-11 ENCOUNTER — Encounter (INDEPENDENT_AMBULATORY_CARE_PROVIDER_SITE_OTHER): Payer: Self-pay | Admitting: Surgery

## 2013-03-11 VITALS — BP 133/84 | HR 78 | Temp 97.7°F | Resp 18 | Ht 64.0 in | Wt 209.2 lb

## 2013-03-11 DIAGNOSIS — K828 Other specified diseases of gallbladder: Secondary | ICD-10-CM

## 2013-03-11 NOTE — Progress Notes (Signed)
Patient ID: Kelly Abbott, female   DOB: 1955-05-20, 58 y.o.   MRN: [id number]  Chief Complaint  Patient presents with  . Abdominal Pain    HPI Kelly Abbott is a 58 y.o. female.   HPI She is referred by Dr. Collene Mares. She presents with a three-month history of epigastric abdominal pain and burning which hurts down to the umbilicus. She denies right upper quadrant abdominal pain. She denies nausea or vomiting. She reports this is worse with fatty meals. She has had improvement when taking the Nexium. She does have chronic constipation. She is otherwise without complaints. Past Medical History  Diagnosis Date  . Vitamin D deficiency   . History of chicken pox   . H/O mumps   . H/O measles   . Panic attacks 2005  . Allergy     Past Surgical History  Procedure Laterality Date  . Appendectomy    . Tubal ligation    . Bunionectomy Right 05/01/12    [place], right foot  . Remove implant deep Right 05/01/12/    Right foot    Family History  Problem Relation Age of Onset  . Hypertension Mother   . Diabetes Mother   . Heart disease Sister   . Hypertension Sister   . Hypertension Brother   . Heart disease Maternal Aunt   . Cancer Maternal Grandmother     Social History History  Substance Use Topics  . Smoking status: Never Smoker   . Smokeless tobacco: Never Used  . Alcohol Use: No    Allergies  Allergen Reactions  . Asa [Aspirin]   . Codeine Hives  . Fish Oil   . Ibuprofen   . Senna     Current Outpatient Prescriptions  Medication Sig Dispense Refill  . Ascorbic Acid (VITAMIN C) 1000 MG tablet Take 1,000 mg by mouth daily.      . cyclobenzaprine (FLEXERIL) 10 MG tablet Take 1 tablet (10 mg total) by mouth at bedtime.  30 tablet  0  . fish oil-omega-3 fatty acids 1000 MG capsule Take 2 g by mouth daily.      Marland Kitchen GAVILYTE-G 236 G solution       . ibuprofen (ADVIL,MOTRIN) 800 MG tablet Take 1 tablet (800 mg total) by mouth every 8 (eight) hours as needed  for pain.  60 tablet  0  . meloxicam (MOBIC) 15 MG tablet Take 1 tablet (15 mg total) by mouth daily.  30 tablet  0  . naproxen sodium (ANAPROX DS) 550 MG tablet Take 1 tablet (550 mg total) by mouth 2 (two) times daily with a meal.  40 tablet  0  . NEXIUM 40 MG capsule       . predniSONE (DELTASONE) 20 MG tablet Take 1 tablet (20 mg total) by mouth daily. 3/3/2/2/1/1 single daily dose for 6 days  12 tablet  0   No current facility-administered medications for this visit.    Review of Systems Review of Systems  Constitutional: Negative for fever, chills and unexpected weight change.  HENT: Negative for congestion, hearing loss, sore throat, trouble swallowing and voice change.   Eyes: Negative for visual disturbance.  Respiratory: Negative for cough and wheezing.   Cardiovascular: Negative for chest pain, palpitations and leg swelling.  Gastrointestinal: Positive for abdominal pain and constipation. Negative for nausea, vomiting, diarrhea, blood in stool, abdominal distention and anal bleeding.  Genitourinary: Negative for hematuria, vaginal bleeding and difficulty urinating.  Musculoskeletal: Negative for arthralgias.  Skin: Negative for rash and  wound.  Neurological: Negative for seizures, syncope and headaches.  Hematological: Negative for adenopathy. Does not bruise/bleed easily.  Psychiatric/Behavioral: Negative for confusion.    Blood pressure 133/84, pulse 78, temperature 97.7 F (36.5 C), temperature source Temporal, resp. rate 18, height 5\' 4"  (1.626 m), weight 209 lb 3.2 oz (94.892 kg).  Physical Exam Physical Exam  Constitutional: She is oriented to person, place, and time. She appears well-developed and well-nourished. No distress.  HENT:  Head: Normocephalic and atraumatic.  Right Ear: External ear normal.  Left Ear: External ear normal.  Nose: Nose normal.  Mouth/Throat: Oropharynx is clear and moist. No oropharyngeal exudate.  Eyes: Conjunctivae are normal. Pupils  are equal, round, and reactive to light. Right eye exhibits no discharge. Left eye exhibits no discharge. No scleral icterus.  Neck: Normal range of motion. Neck supple. No tracheal deviation present.  Cardiovascular: Normal rate, regular rhythm, normal heart sounds and intact distal pulses.   No murmur heard. Pulmonary/Chest: Effort normal and breath sounds normal. No respiratory distress. She has no wheezes. She has no rales.  Abdominal: Soft. She exhibits no distension. There is tenderness.  Very mild tenderness with minimal guarding in the right upper quadrant  Musculoskeletal: Normal range of motion. She exhibits no edema and no tenderness.  Lymphadenopathy:    She has no cervical adenopathy.  Neurological: She is alert and oriented to person, place, and time.  Skin: Skin is warm and dry. No rash noted. She is not diaphoretic. No erythema.  Psychiatric: Her behavior is normal. Judgment normal.    Data Reviewed I have reviewed her laboratory data and x-ray results. Ultrasound is negative for gallstones. Her gallbladder ejection fraction is 20%. Liver function tests are normal  Assessment    Biliary dyskinesia.     Plan    I discussed the diagnosis with her in detail. I discussed laparoscopic cholecystectomy and explained the surgical details in full. I also discussed the risk of surgery in detail. She does understand this may not resolve all her symptoms. As she is improving with Nexium, she elected to go home and think about things rather than scheduling surgery now. She will call me back with her decision.        Nissim Fleischer A 03/11/2013, 1:55 PM

## 2013-03-12 ENCOUNTER — Other Ambulatory Visit (INDEPENDENT_AMBULATORY_CARE_PROVIDER_SITE_OTHER): Payer: Self-pay | Admitting: Surgery

## 2013-03-16 ENCOUNTER — Encounter (HOSPITAL_BASED_OUTPATIENT_CLINIC_OR_DEPARTMENT_OTHER): Payer: Self-pay | Admitting: *Deleted

## 2013-03-16 ENCOUNTER — Other Ambulatory Visit (INDEPENDENT_AMBULATORY_CARE_PROVIDER_SITE_OTHER): Payer: Self-pay | Admitting: Surgery

## 2013-03-16 NOTE — Progress Notes (Signed)
No labs needed

## 2013-03-17 NOTE — H&P (Signed)
Patient ID: Kelly Abbott, female DOB: 09-18-1955, 58 y.o. MRN: [id number]  Chief Complaint   Patient presents with   .  Abdominal Pain   HPI  Kelly Abbott is a 58 y.o. female.  HPI  She is referred by Dr. Collene Mares. She presents with a three-month history of epigastric abdominal pain and burning which hurts down to the umbilicus. She denies right upper quadrant abdominal pain. She denies nausea or vomiting. She reports this is worse with fatty meals. She has had improvement when taking the Nexium. She does have chronic constipation. She is otherwise without complaints.  Past Medical History   Diagnosis  Date   .  Vitamin D deficiency    .  History of chicken pox    .  H/O mumps    .  H/O measles    .  Panic attacks  2005   .  Allergy     Past Surgical History   Procedure  Laterality  Date   .  Appendectomy     .  Tubal ligation     .  Bunionectomy  Right  05/01/12     [place], right foot   .  Remove implant deep  Right  05/01/12/     Right foot    Family History   Problem  Relation  Age of Onset   .  Hypertension  Mother    .  Diabetes  Mother    .  Heart disease  Sister    .  Hypertension  Sister    .  Hypertension  Brother    .  Heart disease  Maternal Aunt    .  Cancer  Maternal Grandmother    Social History  History   Substance Use Topics   .  Smoking status:  Never Smoker   .  Smokeless tobacco:  Never Used   .  Alcohol Use:  No    Allergies   Allergen  Reactions   .  Asa [Aspirin]    .  Codeine  Hives   .  Fish Oil    .  Ibuprofen    .  Senna     Current Outpatient Prescriptions   Medication  Sig  Dispense  Refill   .  Ascorbic Acid (VITAMIN C) 1000 MG tablet  Take 1,000 mg by mouth daily.     .  cyclobenzaprine (FLEXERIL) 10 MG tablet  Take 1 tablet (10 mg total) by mouth at bedtime.  30 tablet  0   .  fish oil-omega-3 fatty acids 1000 MG capsule  Take 2 g by mouth daily.     Marland Kitchen  GAVILYTE-G 236 G solution      .  ibuprofen (ADVIL,MOTRIN)  800 MG tablet  Take 1 tablet (800 mg total) by mouth every 8 (eight) hours as needed for pain.  60 tablet  0   .  meloxicam (MOBIC) 15 MG tablet  Take 1 tablet (15 mg total) by mouth daily.  30 tablet  0   .  naproxen sodium (ANAPROX DS) 550 MG tablet  Take 1 tablet (550 mg total) by mouth 2 (two) times daily with a meal.  40 tablet  0   .  NEXIUM 40 MG capsule      .  predniSONE (DELTASONE) 20 MG tablet  Take 1 tablet (20 mg total) by mouth daily. 3/3/2/2/1/1 single daily dose for 6 days  12 tablet  0    No current facility-administered medications for this visit.  Review of Systems  Review of Systems  Constitutional: Negative for fever, chills and unexpected weight change.  HENT: Negative for congestion, hearing loss, sore throat, trouble swallowing and voice change.  Eyes: Negative for visual disturbance.  Respiratory: Negative for cough and wheezing.  Cardiovascular: Negative for chest pain, palpitations and leg swelling.  Gastrointestinal: Positive for abdominal pain and constipation. Negative for nausea, vomiting, diarrhea, blood in stool, abdominal distention and anal bleeding.  Genitourinary: Negative for hematuria, vaginal bleeding and difficulty urinating.  Musculoskeletal: Negative for arthralgias.  Skin: Negative for rash and wound.  Neurological: Negative for seizures, syncope and headaches.  Hematological: Negative for adenopathy. Does not bruise/bleed easily.  Psychiatric/Behavioral: Negative for confusion.  Blood pressure 133/84, pulse 78, temperature 97.7 F (36.5 C), temperature source Temporal, resp. rate 18, height 5\' 4"  (1.626 m), weight 209 lb 3.2 oz (94.892 kg).  Physical Exam  Physical Exam  Constitutional: She is oriented to person, place, and time. She appears well-developed and well-nourished. No distress.  HENT:  Head: Normocephalic and atraumatic.  Right Ear: External ear normal.  Left Ear: External ear normal.  Nose: Nose normal.  Mouth/Throat: Oropharynx  is clear and moist. No oropharyngeal exudate.  Eyes: Conjunctivae are normal. Pupils are equal, round, and reactive to light. Right eye exhibits no discharge. Left eye exhibits no discharge. No scleral icterus.  Neck: Normal range of motion. Neck supple. No tracheal deviation present.  Cardiovascular: Normal rate, regular rhythm, normal heart sounds and intact distal pulses.  No murmur heard.  Pulmonary/Chest: Effort normal and breath sounds normal. No respiratory distress. She has no wheezes. She has no rales.  Abdominal: Soft. She exhibits no distension. There is tenderness.  Very mild tenderness with minimal guarding in the right upper quadrant  Musculoskeletal: Normal range of motion. She exhibits no edema and no tenderness.  Lymphadenopathy:  She has no cervical adenopathy.  Neurological: She is alert and oriented to person, place, and time.  Skin: Skin is warm and dry. No rash noted. She is not diaphoretic. No erythema.  Psychiatric: Her behavior is normal. Judgment normal.  Data Reviewed  I have reviewed her laboratory data and x-ray results. Ultrasound is negative for gallstones. Her gallbladder ejection fraction is 20%. Liver function tests are normal  Assessment  Biliary dyskinesia.  Plan  I discussed the diagnosis with her in detail. I discussed laparoscopic cholecystectomy and explained the surgical details in full. I also discussed the risk of surgery in detail. She does understand this may not resolve all her symptoms. As she is improving with Nexium, she elected to go home and think about things rather than scheduling surgery now. She will call me back with her decision.

## 2013-03-18 ENCOUNTER — Ambulatory Visit (HOSPITAL_BASED_OUTPATIENT_CLINIC_OR_DEPARTMENT_OTHER)
Admission: RE | Admit: 2013-03-18 | Discharge: 2013-03-18 | Disposition: A | Discharge status: Home / Self Care | Payer: BC Managed Care – PPO | Source: Ambulatory Visit | Attending: Surgery | Admitting: Surgery

## 2013-03-18 ENCOUNTER — Encounter (INDEPENDENT_AMBULATORY_CARE_PROVIDER_SITE_OTHER): Payer: Self-pay

## 2013-03-18 ENCOUNTER — Encounter (HOSPITAL_BASED_OUTPATIENT_CLINIC_OR_DEPARTMENT_OTHER)
Admission: RE | Disposition: A | Discharge status: Home / Self Care | Payer: Self-pay | Source: Ambulatory Visit | Attending: Surgery

## 2013-03-18 ENCOUNTER — Encounter (HOSPITAL_BASED_OUTPATIENT_CLINIC_OR_DEPARTMENT_OTHER): Payer: Self-pay | Admitting: Certified Registered"

## 2013-03-18 ENCOUNTER — Ambulatory Visit (HOSPITAL_BASED_OUTPATIENT_CLINIC_OR_DEPARTMENT_OTHER): Payer: BC Managed Care – PPO | Admitting: Certified Registered"

## 2013-03-18 ENCOUNTER — Encounter (HOSPITAL_BASED_OUTPATIENT_CLINIC_OR_DEPARTMENT_OTHER): Payer: BC Managed Care – PPO | Admitting: Certified Registered"

## 2013-03-18 DIAGNOSIS — E559 Vitamin D deficiency, unspecified: Secondary | ICD-10-CM | POA: Diagnosis not present

## 2013-03-18 DIAGNOSIS — F410 Panic disorder [episodic paroxysmal anxiety] without agoraphobia: Secondary | ICD-10-CM | POA: Diagnosis not present

## 2013-03-18 DIAGNOSIS — K829 Disease of gallbladder, unspecified: Secondary | ICD-10-CM | POA: Diagnosis present

## 2013-03-18 DIAGNOSIS — K828 Other specified diseases of gallbladder: Secondary | ICD-10-CM | POA: Diagnosis not present

## 2013-03-18 DIAGNOSIS — K219 Gastro-esophageal reflux disease without esophagitis: Secondary | ICD-10-CM | POA: Insufficient documentation

## 2013-03-18 DIAGNOSIS — K811 Chronic cholecystitis: Secondary | ICD-10-CM

## 2013-03-18 HISTORY — PX: CHOLECYSTECTOMY: SHX55

## 2013-03-18 HISTORY — DX: Gastro-esophageal reflux disease without esophagitis: K21.9

## 2013-03-18 HISTORY — DX: Presence of dental prosthetic device (complete) (partial): Z97.2

## 2013-03-18 LAB — POCT HEMOGLOBIN-HEMACUE: Hemoglobin: 12.7 g/dL (ref 12.0–15.0)

## 2013-03-18 SURGERY — LAPAROSCOPIC CHOLECYSTECTOMY
Anesthesia: General | Site: Abdomen

## 2013-03-18 MED ORDER — BUPIVACAINE-EPINEPHRINE PF 0.25-1:200000 % IJ SOLN
INTRAMUSCULAR | Status: DC | PRN
  Administered 2013-03-18: 20 mL

## 2013-03-18 MED ORDER — ROCURONIUM BROMIDE 100 MG/10ML IV SOLN
INTRAVENOUS | Status: DC | PRN
  Administered 2013-03-18: 40 mg via INTRAVENOUS

## 2013-03-18 MED ORDER — LACTATED RINGERS IV SOLN
INTRAVENOUS | Status: DC
  Administered 2013-03-18 (×3): via INTRAVENOUS

## 2013-03-18 MED ORDER — SODIUM CHLORIDE 0.9 % IJ SOLN: 3.0000 mL | Freq: Two times a day (BID) | INTRAMUSCULAR | Status: DC

## 2013-03-18 MED ORDER — LIDOCAINE HCL (CARDIAC) 20 MG/ML IV SOLN
INTRAVENOUS | Status: DC | PRN
  Administered 2013-03-18: 60 mg via INTRAVENOUS

## 2013-03-18 MED ORDER — OXYCODONE-ACETAMINOPHEN 5-325 MG PO TABS: 1.0000 | ORAL_TABLET | ORAL | Status: DC | PRN

## 2013-03-18 MED ORDER — ONDANSETRON HCL 4 MG/2ML IJ SOLN: 4.0000 mg | Freq: Once | INTRAMUSCULAR | Status: DC | PRN

## 2013-03-18 MED ORDER — ONDANSETRON HCL 4 MG/2ML IJ SOLN: 4.0000 mg | Freq: Four times a day (QID) | INTRAMUSCULAR | Status: DC | PRN

## 2013-03-18 MED ORDER — FENTANYL CITRATE 0.05 MG/ML IJ SOLN
INTRAMUSCULAR | Status: DC | PRN
  Administered 2013-03-18: 100 ug via INTRAVENOUS
  Administered 2013-03-18 (×2): 50 ug via INTRAVENOUS

## 2013-03-18 MED ORDER — HYDROMORPHONE HCL PF 1 MG/ML IJ SOLN
INTRAMUSCULAR | Status: AC
  Filled 2013-03-18: qty 1

## 2013-03-18 MED ORDER — OXYCODONE HCL 5 MG PO TABS: 5.0000 mg | ORAL_TABLET | Freq: Once | ORAL | Status: DC | PRN

## 2013-03-18 MED ORDER — NEOSTIGMINE METHYLSULFATE 1 MG/ML IJ SOLN
INTRAMUSCULAR | Status: DC | PRN
  Administered 2013-03-18: 5 mg via INTRAVENOUS

## 2013-03-18 MED ORDER — CEFAZOLIN SODIUM-DEXTROSE 2-3 GM-% IV SOLR
2.0000 g | INTRAVENOUS | Status: AC
  Administered 2013-03-18: 2 g via INTRAVENOUS

## 2013-03-18 MED ORDER — BUPIVACAINE-EPINEPHRINE PF 0.5-1:200000 % IJ SOLN
INTRAMUSCULAR | Status: AC
  Filled 2013-03-18: qty 30

## 2013-03-18 MED ORDER — SODIUM CHLORIDE 0.9 % IR SOLN
Status: DC | PRN
  Administered 2013-03-18: 1000 mL via INTRAVESICAL

## 2013-03-18 MED ORDER — FENTANYL CITRATE 0.05 MG/ML IJ SOLN: 50.0000 ug | INTRAMUSCULAR | Status: DC | PRN

## 2013-03-18 MED ORDER — DEXAMETHASONE SODIUM PHOSPHATE 4 MG/ML IJ SOLN
INTRAMUSCULAR | Status: DC | PRN
  Administered 2013-03-18: 10 mg via INTRAVENOUS

## 2013-03-18 MED ORDER — MORPHINE SULFATE 4 MG/ML IJ SOLN: 4.0000 mg | INTRAMUSCULAR | Status: DC | PRN

## 2013-03-18 MED ORDER — MIDAZOLAM HCL 2 MG/2ML IJ SOLN: 1.0000 mg | INTRAMUSCULAR | Status: DC | PRN

## 2013-03-18 MED ORDER — CEFAZOLIN SODIUM-DEXTROSE 2-3 GM-% IV SOLR
INTRAVENOUS | Status: AC
  Filled 2013-03-18: qty 50

## 2013-03-18 MED ORDER — FENTANYL CITRATE 0.05 MG/ML IJ SOLN
INTRAMUSCULAR | Status: AC
  Filled 2013-03-18: qty 6

## 2013-03-18 MED ORDER — GLYCOPYRROLATE 0.2 MG/ML IJ SOLN
INTRAMUSCULAR | Status: DC | PRN
  Administered 2013-03-18: .8 mg via INTRAVENOUS

## 2013-03-18 MED ORDER — SODIUM CHLORIDE 0.9 % IV SOLN: 250.0000 mL | INTRAVENOUS | Status: DC | PRN

## 2013-03-18 MED ORDER — PROPOFOL 10 MG/ML IV BOLUS
INTRAVENOUS | Status: DC | PRN
  Administered 2013-03-18: 200 mg via INTRAVENOUS

## 2013-03-18 MED ORDER — BUPIVACAINE HCL (PF) 0.25 % IJ SOLN
INTRAMUSCULAR | Status: AC
  Filled 2013-03-18: qty 30

## 2013-03-18 MED ORDER — MIDAZOLAM HCL 5 MG/5ML IJ SOLN
INTRAMUSCULAR | Status: DC | PRN
  Administered 2013-03-18: 2 mg via INTRAVENOUS

## 2013-03-18 MED ORDER — ACETAMINOPHEN 325 MG PO TABS: 650.0000 mg | ORAL_TABLET | ORAL | Status: DC | PRN

## 2013-03-18 MED ORDER — ONDANSETRON HCL 4 MG/2ML IJ SOLN
INTRAMUSCULAR | Status: DC | PRN
  Administered 2013-03-18: 4 mg via INTRAVENOUS

## 2013-03-18 MED ORDER — ACETAMINOPHEN 650 MG RE SUPP: 650.0000 mg | RECTAL | Status: DC | PRN

## 2013-03-18 MED ORDER — SODIUM CHLORIDE 0.9 % IJ SOLN: 3.0000 mL | INTRAMUSCULAR | Status: DC | PRN

## 2013-03-18 MED ORDER — BUPIVACAINE-EPINEPHRINE PF 0.25-1:200000 % IJ SOLN
INTRAMUSCULAR | Status: AC
Start: 2013-03-18 — End: 2013-03-18
  Filled 2013-03-18: qty 30

## 2013-03-18 MED ORDER — HYDROMORPHONE HCL PF 1 MG/ML IJ SOLN
0.2500 mg | INTRAMUSCULAR | Status: DC | PRN
  Administered 2013-03-18 (×4): 0.5 mg via INTRAVENOUS

## 2013-03-18 MED ORDER — MIDAZOLAM HCL 2 MG/2ML IJ SOLN
INTRAMUSCULAR | Status: AC
  Filled 2013-03-18: qty 2

## 2013-03-18 MED ORDER — OXYCODONE HCL 5 MG PO TABS: 5.0000 mg | ORAL_TABLET | ORAL | Status: DC | PRN

## 2013-03-18 MED ORDER — OXYCODONE HCL 5 MG/5ML PO SOLN: 5.0000 mg | Freq: Once | ORAL | Status: DC | PRN

## 2013-03-18 SURGICAL SUPPLY — 38 items
APPLIER CLIP 5 13 M/L LIGAMAX5 (MISCELLANEOUS) ×3
BANDAGE ADH SHEER 1  50/CT (GAUZE/BANDAGES/DRESSINGS) ×12 IMPLANT
BENZOIN TINCTURE PRP APPL 2/3 (GAUZE/BANDAGES/DRESSINGS) ×3 IMPLANT
BLADE SURG ROTATE 9660 (MISCELLANEOUS) IMPLANT
CANISTER SUCT 3000ML (MISCELLANEOUS) ×3 IMPLANT
CHLORAPREP W/TINT 26ML (MISCELLANEOUS) ×3 IMPLANT
CLIP APPLIE 5 13 M/L LIGAMAX5 (MISCELLANEOUS) ×1 IMPLANT
COVER MAYO STAND STRL (DRAPES) IMPLANT
DECANTER SPIKE VIAL GLASS SM (MISCELLANEOUS) ×3 IMPLANT
DRAPE C-ARM 42X72 X-RAY (DRAPES) IMPLANT
DRAPE UTILITY XL STRL (DRAPES) ×3 IMPLANT
ELECT REM PT RETURN 9FT ADLT (ELECTROSURGICAL) ×3
ELECTRODE REM PT RTRN 9FT ADLT (ELECTROSURGICAL) ×1 IMPLANT
FILTER SMOKE EVAC LAPAROSHD (FILTER) ×3 IMPLANT
GLOVE BIOGEL PI IND STRL 7.0 (GLOVE) ×2 IMPLANT
GLOVE BIOGEL PI INDICATOR 7.0 (GLOVE) ×4
GLOVE ECLIPSE 6.5 STRL STRAW (GLOVE) ×9 IMPLANT
GLOVE SURG SIGNA 7.5 PF LTX (GLOVE) ×3 IMPLANT
GOWN STRL NON-REIN LRG LVL3 (GOWN DISPOSABLE) ×9 IMPLANT
GOWN STRL REUS W/ TWL LRG LVL3 (GOWN DISPOSABLE) ×1 IMPLANT
GOWN STRL REUS W/TWL LRG LVL3 (GOWN DISPOSABLE) ×2
NS IRRIG 1000ML POUR BTL (IV SOLUTION) ×3 IMPLANT
PACK BASIN DAY SURGERY FS (CUSTOM PROCEDURE TRAY) ×3 IMPLANT
POUCH SPECIMEN RETRIEVAL 10MM (ENDOMECHANICALS) ×3 IMPLANT
SCISSORS LAP 5X35 DISP (ENDOMECHANICALS) IMPLANT
SET CHOLANGIOGRAPH 5 50 .035 (SET/KITS/TRAYS/PACK) IMPLANT
SET IRRIG TUBING LAPAROSCOPIC (IRRIGATION / IRRIGATOR) ×3 IMPLANT
SLEEVE ENDOPATH XCEL 5M (ENDOMECHANICALS) ×6 IMPLANT
SLEEVE SCD COMPRESS KNEE MED (MISCELLANEOUS) ×3 IMPLANT
SPECIMEN JAR SMALL (MISCELLANEOUS) ×3 IMPLANT
SUT MON AB 4-0 PC3 18 (SUTURE) ×3 IMPLANT
TOWEL OR 17X24 6PK STRL BLUE (TOWEL DISPOSABLE) ×3 IMPLANT
TOWEL OR NON WOVEN STRL DISP B (DISPOSABLE) ×3 IMPLANT
TRAY LAPAROSCOPIC (CUSTOM PROCEDURE TRAY) ×3 IMPLANT
TROCAR XCEL BLUNT TIP 100MML (ENDOMECHANICALS) ×3 IMPLANT
TROCAR XCEL NON-BLD 5MMX100MML (ENDOMECHANICALS) ×3 IMPLANT
TUBE CONNECTING 20'X1/4 (TUBING) ×1
TUBE CONNECTING 20X1/4 (TUBING) ×2 IMPLANT

## 2013-03-18 NOTE — Op Note (Signed)

## 2013-03-18 NOTE — Transfer of Care (Signed)
Immediate Anesthesia Transfer of Care Note  Patient: Kelly Abbott  Procedure(s) Performed: Procedure(s): LAPAROSCOPIC CHOLECYSTECTOMY (N/A)  Patient Location: PACU  Anesthesia Type:General  Level of Consciousness: awake, alert , oriented and patient cooperative  Airway & Oxygen Therapy: Patient Spontanous Breathing and Patient connected to face mask oxygen  Post-op Assessment: Report given to PACU RN and Post -op Vital signs reviewed and stable  Post vital signs: Reviewed and stable  Complications: No apparent anesthesia complications

## 2013-03-18 NOTE — Interval H&P Note (Signed)
History and Physical Interval Note: no change in H and P  03/18/2013 11:38 AM  Kelly Abbott  has presented today for surgery, with the diagnosis of bilinary dyskinesia  The various methods of treatment have been discussed with the patient and family. After consideration of risks, benefits and other options for treatment, the patient has consented to  Procedure(s): LAPAROSCOPIC CHOLECYSTECTOMY (N/A) as a surgical intervention .  The patient's history has been reviewed, patient examined, no change in status, stable for surgery.  I have reviewed the patient's chart and labs.  Questions were answered to the patient's satisfaction.     Kiet Geer A

## 2013-03-18 NOTE — Discharge Instructions (Signed)
[facility] ______[facility] LAPAROSCOPIC SURGERY: POST OP INSTRUCTIONS Always review your discharge instruction sheet given to you by the facility where your surgery was performed. IF YOU HAVE DISABILITY OR FAMILY LEAVE FORMS, YOU MUST BRING THEM TO THE OFFICE FOR PROCESSING.   DO NOT GIVE THEM TO YOUR DOCTOR.  1. A prescription for pain medication may be given to you upon discharge.  Take your pain medication as prescribed, if needed.  If narcotic pain medicine is not needed, then you may take acetaminophen (Tylenol) or ibuprofen (Advil) as needed. 2. Take your usually prescribed medications unless otherwise directed. 3. If you need a refill on your pain medication, please contact your pharmacy.  They will contact our office to request authorization. Prescriptions will not be filled after 5pm or on week-ends. 4. You should follow a light diet the first few days after arrival home, such as soup and crackers, etc.  Be sure to include lots of fluids daily. 5. Most patients will experience some swelling and bruising in the area of the incisions.  Ice packs will help.  Swelling and bruising can take several days to resolve.  6. It is common to experience some constipation if taking pain medication after surgery.  Increasing fluid intake and taking a stool softener (such as Colace) will usually help or prevent this problem from occurring.  A mild laxative (Milk of Magnesia or Miralax) should be taken according to package instructions if there are no bowel movements after 48 hours. 7. Unless discharge instructions indicate otherwise, you may remove your bandages 24-48 hours after surgery, and you may shower at that time.  You may have steri-strips (small skin tapes) in place directly over the incision.  These strips should be left on the skin for 7-10 days.  If your surgeon used skin glue on the incision, you may shower in 24 hours.  The glue will flake off over the next 2-3 weeks.  Any sutures or  staples will be removed at the office during your follow-up visit. 8. ACTIVITIES:  You may resume regular (light) daily activities beginning the next day--such as daily self-care, walking, climbing stairs--gradually increasing activities as tolerated.  You may have sexual intercourse when it is comfortable.  Refrain from any heavy lifting or straining until approved by your doctor. a. You may drive when you are no longer taking prescription pain medication, you can comfortably wear a seatbelt, and you can safely maneuver your car and apply brakes. b. RETURN TO WORK:  __________________________________________________________ 9. You should see your doctor in the office for a follow-up appointment approximately 2-3 weeks after your surgery.  Make sure that you call for this appointment within a day or two after you arrive home to insure a convenient appointment time. OTHER INSTRUCTIONS: _________NO LIFTING MORE THAN 15 POUNDS FOR 2 WEEKS. _________________________________________________________________________________________________________________ __________________________________________________________________________________________________________________________ WHEN TO CALL YOUR DOCTOR: 1. Fever over 101.0 2. Inability to urinate 3. Continued bleeding from incision. 4. Increased pain, redness, or drainage from the incision. 5. Increasing abdominal pain  The clinic staff is available to answer your questions during regular business hours.  Please dont hesitate to call and ask to speak to one of the nurses for clinical concerns.  If you have a medical emergency, go to the nearest emergency room or call 911.  A surgeon from [facility] is always on call at the hospital. [address], [place], [place], [place]  [address] ? [address], [place], [place]   [address] [phone] ?  [phone] ? FAX [phone] Web site: [email or web address]   Post Anesthesia  Home Care Instructions  Activity: Get plenty of rest for the remainder of the day. A responsible adult should stay with you for 24 hours following the procedure.  For the next 24 hours, DO NOT: -Drive a car -Paediatric nurse -Drink alcoholic beverages -Take any medication unless instructed by your physician -Make any legal decisions or sign important papers.  Meals: Start with liquid foods such as gelatin or soup. Progress to regular foods as tolerated. Avoid greasy, spicy, heavy foods. If nausea and/or vomiting occur, drink only clear liquids until the nausea and/or vomiting subsides. Call your physician if vomiting continues.  Special Instructions/Symptoms: Your throat may feel dry or sore from the anesthesia or the breathing tube placed in your throat during surgery. If this causes discomfort, gargle with warm salt water. The discomfort should disappear within 24 hours.  Post Anesthesia Home Care Instructions  Activity: Get plenty of rest for the remainder of the day. A responsible adult should stay with you for 24 hours following the procedure.  For the next 24 hours, DO NOT: -Drive a car -Paediatric nurse -Drink alcoholic beverages -Take any medication unless instructed by your physician -Make any legal decisions or sign important papers.  Meals: Start with liquid foods such as gelatin or soup. Progress to regular foods as tolerated. Avoid greasy, spicy, heavy foods. If nausea and/or vomiting occur, drink only clear liquids until the nausea and/or vomiting subsides. Call your physician if vomiting continues.  Special Instructions/Symptoms: Your throat may feel dry or sore from the anesthesia or the breathing tube placed in your throat during surgery. If this causes discomfort, gargle with warm salt water. The discomfort should disappear within 24 hours.

## 2013-03-18 NOTE — Anesthesia Preprocedure Evaluation (Signed)
Anesthesia Evaluation  Patient identified by MRN, date of birth, ID band Patient awake    Reviewed: Allergy & Precautions, H&P , NPO status , [name] Chart, lab work & pertinent test results  Airway Mallampati: I TM Distance: >3 FB Neck ROM: Full    Dental  (+) Teeth Intact, Partial Upper and Dental Advisory Given   Pulmonary  breath sounds clear to auscultation        Cardiovascular Rhythm:Regular     Neuro/Psych    GI/Hepatic GERD-  Medicated and Controlled,  Endo/Other  Morbid obesity  Renal/GU      Musculoskeletal   Abdominal   Peds  Hematology   Anesthesia Other Findings   Reproductive/Obstetrics                           Anesthesia Physical Anesthesia Plan  ASA: II  Anesthesia Plan: General   Post-op Pain Management:    Induction: Intravenous  Airway Management Planned: Oral ETT  Additional Equipment:   Intra-op Plan:   Post-operative Plan: Extubation in OR  Informed Consent: I have reviewed the patients History and Physical, chart, labs and discussed the procedure including the risks, benefits and alternatives for the proposed anesthesia with the patient or authorized representative who has indicated his/her understanding and acceptance.   Dental advisory given  Plan Discussed with: CRNA, Anesthesiologist and Surgeon  Anesthesia Plan Comments:         Anesthesia Quick Evaluation

## 2013-03-18 NOTE — Anesthesia Procedure Notes (Signed)
Procedure Name: Intubation Date/Time: 03/18/2013 1:40 PM Performed by: Brandan Glauber Pre-anesthesia Checklist: Patient identified, Emergency Drugs available, Suction available and Patient being monitored Patient Re-evaluated:Patient Re-evaluated prior to inductionOxygen Delivery Method: Circle System Utilized Preoxygenation: Pre-oxygenation with 100% oxygen Intubation Type: IV induction Ventilation: Mask ventilation without difficulty Laryngoscope Size: Mac and 3 Tube type: Oral Tube size: 7.0 mm Number of attempts: 2 Airway Equipment and Method: stylet,  oral airway and Video-laryngoscopy Placement Confirmation: ETT inserted through vocal cords under direct vision,  positive ETCO2 and breath sounds checked- equal and bilateral Secured at: 21 cm Tube secured with: Tape Dental Injury: Teeth and Oropharynx as per pre-operative assessment  Comments: DL x 1 unable to view vocal cords ( small oral opening ), DL x 1 with glide scope used VC visualized 7.0 ETT AOI, BBS equal + ETco2 , dentition unchanged

## 2013-03-18 NOTE — Anesthesia Postprocedure Evaluation (Signed)
  Anesthesia Post-op Note  Patient: Kelly Abbott  Procedure(s) Performed: Procedure(s): LAPAROSCOPIC CHOLECYSTECTOMY (N/A)  Patient Location: PACU  Anesthesia Type:General  Level of Consciousness: awake, alert  and oriented  Airway and Oxygen Therapy: Patient Spontanous Breathing and Patient connected to face mask oxygen  Post-op Pain: mild  Post-op Assessment: Post-op Vital signs reviewed  Post-op Vital Signs: Reviewed  Complications: No apparent anesthesia complications

## 2013-03-19 ENCOUNTER — Telehealth (INDEPENDENT_AMBULATORY_CARE_PROVIDER_SITE_OTHER): Payer: Self-pay | Admitting: General Surgery

## 2013-03-19 ENCOUNTER — Encounter (HOSPITAL_BASED_OUTPATIENT_CLINIC_OR_DEPARTMENT_OTHER): Payer: Self-pay | Admitting: Surgery

## 2013-03-19 ENCOUNTER — Encounter (INDEPENDENT_AMBULATORY_CARE_PROVIDER_SITE_OTHER): Payer: Self-pay | Admitting: General Surgery

## 2013-03-19 NOTE — Telephone Encounter (Signed)
Pt called for RTW letter.  She had lap chole yesterday and would like to RTW on 04/08/13.  Letter written and FAXed to Attn:  Dr. Adela Ports at [phone].  Pt has post op OV scheduled for 04/20/13.

## 2013-03-24 NOTE — Telephone Encounter (Signed)
Pt called asking for letter to be refaxed. Pt request to fax letter to [phone]. I have lmom for pt that we have tried this # multiple times and are having difficulty getting fax thru. Pt needs to provide a different fax # or come by to pick up copy. Pt returned call and will pick up copy at front desk.

## 2013-03-27 ENCOUNTER — Emergency Department (HOSPITAL_COMMUNITY): Payer: BC Managed Care – PPO

## 2013-03-27 ENCOUNTER — Emergency Department (HOSPITAL_COMMUNITY)
Admission: EM | Admit: 2013-03-27 | Discharge: 2013-03-27 | Disposition: A | Discharge status: Home / Self Care | Payer: BC Managed Care – PPO | Attending: Emergency Medicine | Admitting: Emergency Medicine

## 2013-03-27 ENCOUNTER — Encounter (HOSPITAL_COMMUNITY): Payer: Self-pay | Admitting: Emergency Medicine

## 2013-03-27 DIAGNOSIS — M549 Dorsalgia, unspecified: Secondary | ICD-10-CM | POA: Insufficient documentation

## 2013-03-27 DIAGNOSIS — R110 Nausea: Secondary | ICD-10-CM | POA: Insufficient documentation

## 2013-03-27 DIAGNOSIS — R1011 Right upper quadrant pain: Secondary | ICD-10-CM | POA: Insufficient documentation

## 2013-03-27 DIAGNOSIS — K219 Gastro-esophageal reflux disease without esophagitis: Secondary | ICD-10-CM | POA: Insufficient documentation

## 2013-03-27 DIAGNOSIS — Z8639 Personal history of other endocrine, nutritional and metabolic disease: Secondary | ICD-10-CM | POA: Insufficient documentation

## 2013-03-27 DIAGNOSIS — R142 Eructation: Secondary | ICD-10-CM | POA: Insufficient documentation

## 2013-03-27 DIAGNOSIS — R143 Flatulence: Secondary | ICD-10-CM

## 2013-03-27 DIAGNOSIS — Z9089 Acquired absence of other organs: Secondary | ICD-10-CM | POA: Insufficient documentation

## 2013-03-27 DIAGNOSIS — R141 Gas pain: Secondary | ICD-10-CM | POA: Insufficient documentation

## 2013-03-27 DIAGNOSIS — Z79899 Other long term (current) drug therapy: Secondary | ICD-10-CM | POA: Insufficient documentation

## 2013-03-27 DIAGNOSIS — Z862 Personal history of diseases of the blood and blood-forming organs and certain disorders involving the immune mechanism: Secondary | ICD-10-CM | POA: Insufficient documentation

## 2013-03-27 DIAGNOSIS — Z8659 Personal history of other mental and behavioral disorders: Secondary | ICD-10-CM | POA: Insufficient documentation

## 2013-03-27 DIAGNOSIS — R300 Dysuria: Secondary | ICD-10-CM | POA: Insufficient documentation

## 2013-03-27 DIAGNOSIS — Z791 Long term (current) use of non-steroidal anti-inflammatories (NSAID): Secondary | ICD-10-CM | POA: Insufficient documentation

## 2013-03-27 DIAGNOSIS — Z8619 Personal history of other infectious and parasitic diseases: Secondary | ICD-10-CM | POA: Insufficient documentation

## 2013-03-27 DIAGNOSIS — K5900 Constipation, unspecified: Secondary | ICD-10-CM | POA: Insufficient documentation

## 2013-03-27 DIAGNOSIS — R109 Unspecified abdominal pain: Secondary | ICD-10-CM

## 2013-03-27 LAB — URINALYSIS, ROUTINE W REFLEX MICROSCOPIC
Bilirubin Urine: NEGATIVE
Glucose, UA: NEGATIVE mg/dL
KETONES UR: NEGATIVE mg/dL
LEUKOCYTES UA: NEGATIVE
NITRITE: NEGATIVE
PROTEIN: NEGATIVE mg/dL
Specific Gravity, Urine: 1.013 (ref 1.005–1.030)
Urobilinogen, UA: 0.2 mg/dL (ref 0.0–1.0)
pH: 5.5 (ref 5.0–8.0)

## 2013-03-27 LAB — CBC WITH DIFFERENTIAL/PLATELET
BASOS PCT: 0 % (ref 0–1)
Basophils Absolute: 0 10*3/uL (ref 0.0–0.1)
EOS PCT: 2 % (ref 0–5)
Eosinophils Absolute: 0.2 10*3/uL (ref 0.0–0.7)
HEMATOCRIT: 38 % (ref 36.0–46.0)
HEMOGLOBIN: 12.6 g/dL (ref 12.0–15.0)
Lymphocytes Relative: 21 % (ref 12–46)
Lymphs Abs: 2.2 10*3/uL (ref 0.7–4.0)
MCH: 27.3 pg (ref 26.0–34.0)
MCHC: 33.2 g/dL (ref 30.0–36.0)
MCV: 82.3 fL (ref 78.0–100.0)
MONOS PCT: 8 % (ref 3–12)
Monocytes Absolute: 0.8 10*3/uL (ref 0.1–1.0)
NEUTROS ABS: 7.3 10*3/uL (ref 1.7–7.7)
Neutrophils Relative %: 69 % (ref 43–77)
Platelets: 380 10*3/uL (ref 150–400)
RBC: 4.62 MIL/uL (ref 3.87–5.11)
RDW: 15 % (ref 11.5–15.5)
WBC: 10.5 10*3/uL (ref 4.0–10.5)

## 2013-03-27 LAB — COMPREHENSIVE METABOLIC PANEL
ALT: 15 U/L (ref 0–35)
AST: 15 U/L (ref 0–37)
Albumin: 3.5 g/dL (ref 3.5–5.2)
Alkaline Phosphatase: 103 U/L (ref 39–117)
BUN: 7 mg/dL (ref 6–23)
CALCIUM: 8.9 mg/dL (ref 8.4–10.5)
CHLORIDE: 102 meq/L (ref 96–112)
CO2: 22 meq/L (ref 19–32)
Creatinine, Ser: 0.6 mg/dL (ref 0.50–1.10)
GFR calc Af Amer: >90 mL/min (ref >90)
Glucose, Bld: 102 mg/dL — ABNORMAL HIGH (ref 70–99)
Potassium: 4.2 mEq/L (ref 3.7–5.3)
Sodium: 138 mEq/L (ref 137–147)
Total Bilirubin: 0.3 mg/dL (ref 0.3–1.2)
Total Protein: 7.4 g/dL (ref 6.0–8.3)

## 2013-03-27 LAB — URINE MICROSCOPIC-ADD ON

## 2013-03-27 LAB — LIPASE, BLOOD: LIPASE: 16 U/L (ref 11–59)

## 2013-03-27 LAB — CG4 I-STAT (LACTIC ACID): LACTIC ACID, VENOUS: 1.07 mmol/L (ref 0.5–2.2)

## 2013-03-27 MED ORDER — IOHEXOL 300 MG/ML  SOLN
100.0000 mL | Freq: Once | INTRAMUSCULAR | Status: AC | PRN
  Administered 2013-03-27: 100 mL via INTRAVENOUS

## 2013-03-27 MED ORDER — POLYETHYLENE GLYCOL 3350 17 G PO PACK: 17.0000 g | PACK | Freq: Every day | ORAL | Status: DC

## 2013-03-27 MED ORDER — SODIUM CHLORIDE 0.9 % IV BOLUS (SEPSIS)
1000.0000 mL | Freq: Once | INTRAVENOUS | Status: AC
  Administered 2013-03-27: 1000 mL via INTRAVENOUS

## 2013-03-27 MED ORDER — OXYCODONE-ACETAMINOPHEN 5-325 MG PO TABS: 1.0000 | ORAL_TABLET | ORAL | Status: DC | PRN

## 2013-03-27 MED ORDER — HYDROMORPHONE HCL PF 1 MG/ML IJ SOLN
1.0000 mg | Freq: Once | INTRAMUSCULAR | Status: AC
  Administered 2013-03-27: 1 mg via INTRAVENOUS
  Filled 2013-03-27: qty 1

## 2013-03-27 MED ORDER — TECHNETIUM TC 99M MEBROFENIN IV KIT
5.0000 | PACK | Freq: Once | INTRAVENOUS | Status: AC | PRN
  Administered 2013-03-27: 5 via INTRAVENOUS

## 2013-03-27 MED ORDER — IOHEXOL 300 MG/ML  SOLN: 25.0000 mL | INTRAMUSCULAR | Status: AC

## 2013-03-27 MED ORDER — ONDANSETRON HCL 4 MG/2ML IJ SOLN
4.0000 mg | Freq: Once | INTRAMUSCULAR | Status: AC
  Administered 2013-03-27: 4 mg via INTRAVENOUS
  Filled 2013-03-27: qty 2

## 2013-03-27 NOTE — H&P (Signed)
Chief Complaint: RUQ abdominal pain HPI: Kelly Abbott is a 58 year old female patient of Dr. Ninfa Linden who underwent a laparoscopic cholecystectomy on 03/17/13 and was discharged home.  She was doing fairly well until last night at which time she developed worsening RUQ abdominal pain.  Characterized as sharp, nagging constant pain.  Associated with nausea, no vomiting.  She was constipated, but had a bowel movement this morning.  She reports lifting a box of water yesterday and thinks she "tore something."  Modifying factors include; pain meds which helped alleviate the pain.  No aggravating.  Alleviating factors; pain meds.  CT of abdomen and pelvis negative for fluid collection.  Normal labs including white count and LFTs.    Past Medical History  Diagnosis Date  . Vitamin D deficiency   . History of chicken pox   . H/O mumps   . H/O measles   . Panic attacks 2005  . Allergy   . GERD (gastroesophageal reflux disease)   . Wears partial dentures     upper partial    Past Surgical History  Procedure Laterality Date  . Appendectomy    . Tubal ligation    . Bunionectomy Right 05/01/12    [place], right foot  . Remove implant deep Right 05/01/12/    Right foot  . Colonoscopy    . Cholecystectomy N/A 03/18/2013    Procedure: LAPAROSCOPIC CHOLECYSTECTOMY;  Surgeon: Harl Bowie, MD;  Location: [facility];  Service: General;  Laterality: N/A;    Family History  Problem Relation Age of Onset  . Hypertension Mother   . Diabetes Mother   . Heart disease Sister   . Hypertension Sister   . Hypertension Brother   . Heart disease Maternal Aunt   . Cancer Maternal Grandmother    Social History:  reports that she has never smoked. She has never used smokeless tobacco. She reports that she does not drink alcohol or use illicit drugs.  Allergies:  Allergies  Allergen Reactions  . Codeine Hives  . Asa [Aspirin] Rash     (Not in a hospital  admission)  Results for orders placed during the hospital encounter of 03/27/13 (from the past 48 hour(s))  CBC WITH DIFFERENTIAL     Status: None   Collection Time    03/27/13  8:35 AM      Result Value Range   WBC 10.5  4.0 - 10.5 K/uL   RBC 4.62  3.87 - 5.11 MIL/uL   Hemoglobin 12.6  12.0 - 15.0 g/dL   HCT 38.0  36.0 - 46.0 %   MCV 82.3  78.0 - 100.0 fL   MCH 27.3  26.0 - 34.0 pg   MCHC 33.2  30.0 - 36.0 g/dL   RDW 15.0  11.5 - 15.5 %   Platelets 380  150 - 400 K/uL   Neutrophils Relative % 69  43 - 77 %   Neutro Abs 7.3  1.7 - 7.7 K/uL   Lymphocytes Relative 21  12 - 46 %   Lymphs Abs 2.2  0.7 - 4.0 K/uL   Monocytes Relative 8  3 - 12 %   Monocytes Absolute 0.8  0.1 - 1.0 K/uL   Eosinophils Relative 2  0 - 5 %   Eosinophils Absolute 0.2  0.0 - 0.7 K/uL   Basophils Relative 0  0 - 1 %   Basophils Absolute 0.0  0.0 - 0.1 K/uL  COMPREHENSIVE METABOLIC PANEL     Status:  Abnormal   Collection Time    03/27/13  8:35 AM      Result Value Range   Sodium 138  137 - 147 mEq/L   Potassium 4.2  3.7 - 5.3 mEq/L   Chloride 102  96 - 112 mEq/L   CO2 22  19 - 32 mEq/L   Glucose, Bld 102 (*) 70 - 99 mg/dL   BUN 7  6 - 23 mg/dL   Creatinine, Ser 0.60  0.50 - 1.10 mg/dL   Calcium 8.9  8.4 - 10.5 mg/dL   Total Protein 7.4  6.0 - 8.3 g/dL   Albumin 3.5  3.5 - 5.2 g/dL   AST 15  0 - 37 U/L   ALT 15  0 - 35 U/L   Alkaline Phosphatase 103  39 - 117 U/L   Total Bilirubin 0.3  0.3 - 1.2 mg/dL   GFR calc non Af Amer >90  >90 mL/min   GFR calc Af Amer >90  >90 mL/min   Comment: (NOTE)     The eGFR has been calculated using the CKD EPI equation.     This calculation has not been validated in all clinical situations.     eGFR's persistently <90 mL/min signify possible Chronic Kidney     Disease.  LIPASE, BLOOD     Status: None   Collection Time    03/27/13  8:35 AM      Result Value Range   Lipase 16  11 - 59 U/L  URINALYSIS, ROUTINE W REFLEX MICROSCOPIC     Status: Abnormal    Collection Time    03/27/13  8:44 AM      Result Value Range   Color, Urine YELLOW  YELLOW   APPearance CLEAR  CLEAR   Specific Gravity, Urine 1.013  1.005 - 1.030   pH 5.5  5.0 - 8.0   Glucose, UA NEGATIVE  NEGATIVE mg/dL   Hgb urine dipstick SMALL (*) NEGATIVE   Bilirubin Urine NEGATIVE  NEGATIVE   Ketones, ur NEGATIVE  NEGATIVE mg/dL   Protein, ur NEGATIVE  NEGATIVE mg/dL   Urobilinogen, UA 0.2  0.0 - 1.0 mg/dL   Nitrite NEGATIVE  NEGATIVE   Leukocytes, UA NEGATIVE  NEGATIVE  URINE MICROSCOPIC-ADD ON     Status: None   Collection Time    03/27/13  8:44 AM      Result Value Range   Squamous Epithelial / LPF RARE  RARE   WBC, UA 0-2  <3 WBC/hpf   RBC / HPF 0-2  <3 RBC/hpf  CG4 I-STAT (LACTIC ACID)     Status: None   Collection Time    03/27/13  8:50 AM      Result Value Range   Lactic Acid, Venous 1.07  0.5 - 2.2 mmol/L   Ct Abdomen Pelvis W Contrast  03/27/2013   CLINICAL DATA:  Abdominal pain, history of recent cholecystectomy  EXAM: CT ABDOMEN AND PELVIS WITH CONTRAST  TECHNIQUE: Multidetector CT imaging of the abdomen and pelvis was performed using the standard protocol following bolus administration of intravenous contrast.  CONTRAST:  14m OMNIPAQUE IOHEXOL 300 MG/ML  SOLN  COMPARISON:  CT abdomen pelvis-11/28/2010; nuclear medicine HIDA scan - 02/16/2013; abdominal ultrasound - 02/16/2013  FINDINGS: Normal hepatic contour. Grossly unchanged appearance of approximately 1.4 x 1.2 cm hypo attenuating nonenhancing cyst within the anterior segment of the right lobe of the liver adjacent to the gallbladder fossa (image 25, series 2). Post cholecystectomy. There is a very minimal  amount of stranding within the gallbladder fossa extending to the caudal aspect of the right lobe of the liver. No discrete definable/drainable fluid collections. No definite intra or extrahepatic biliary ductal dilatation. No ascites.  There is symmetric enhancement and excretion of the bilateral kidneys.  There is a suspected punctate (approximately 0.4 cm) nonobstructing renal stone within the inferior pole of the left kidney (coronal image 108, series 5). No urinary obstruction or perinephric stranding. Normal appearance of the bilateral adrenal glands, pancreas and spleen.  Ingestion enteric contrast extends to the level of the mid small bowel. The bowel is normal in course in caliber without wall thickening or evidence of obstruction. The appendix is not visualized compatible with provided surgical history. No pneumoperitoneum, pneumatosis or portal venous gas. No definable/ drainable intra-abdominal fluid collection.  Normal caliber the abdominal aorta. The major branch vessels of the abdominal aorta are patent on this non CTA examination. Scattered shotty retroperitoneal lymph nodes are individually not enlarged by size criteria with index aortocaval lymph node measuring approximately 0.6 cm in greatest short axis diameter (image 48, series 2). No definite retroperitoneal, mesenteric, pelvic or inguinal lymphadenopathy.  Normal appearance of the pelvic organs. No discrete adnexal mass. There is minimal reflux of contrast into a mildly hypertrophied left gonadal vein with filling of several mildly prominent varicosities within the left hemipelvis. No free fluid within the pelvis.  Evaluation of the imaged lower thorax is degraded secondary to patient respiratory artifact. There are minimal subpleural ground-glass opacities within the dependent portion of the bilateral lower lobes. There is minimal subsegmental atelectasis within the image caudal segment of the lingula. No discrete focal airspace opacities. No pleural effusion.  Normal heart size.  No pericardial effusion.  No acute or aggressive osseus abnormalities. Mild to moderate multilevel lumbar spine DDD, worse at L5-S1 with disc space height loss, endplate irregularity and small posteriorly directed disc osteophyte complex at this location. Bilateral  facet degenerative change in the lower lumbar spine.  There is minimal stranding about the umbilicus, likely at a laparoscopy site. No associated definable/drainable fluid collection. No associated mesenteric hernia.  IMPRESSION: 1. Post cholecystectomy without evidence of complication. Specifically, no evidence of definable/drainable fluid collection. No intra or extrahepatic biliary ductal dilatation. 2. Minimal amount of stranding about the umbilicus, presumably at a laparoscopy port site. 3. Suspected punctate (approximately 0.4 cm) nonobstructing stone within the inferior pole of the left kidney. 4. Retrograde flow into a mildly hypertrophied left gonadal vein with filling of several mildly prominent left sided adnexal varicosities, nonspecific though could be seen in the setting of pelvic congestion syndrome. Clinical correlation is advised.   Electronically Signed   By: Sandi Mariscal M.D.   On: 03/27/2013 09:49    Review of Systems  All other systems reviewed and are negative.    Blood pressure 116/62, pulse 66, resp. rate 18, SpO2 100.00%. Physical Exam  Constitutional: She appears well-developed and well-nourished. No distress.  HENT:  Head: Normocephalic and atraumatic.  Mouth/Throat: No oropharyngeal exudate.  Cardiovascular: Normal rate, regular rhythm, normal heart sounds and intact distal pulses.  Exam reveals no gallop and no friction rub.   No murmur heard. Respiratory: Effort normal and breath sounds normal. No respiratory distress. She has no wheezes. She has no rales. She exhibits no tenderness.  GI: Soft. Bowel sounds are normal. She exhibits no distension.  TTP RUQ without guarding or evidence of peritonitis.  Incision sites are c/d/i.   Skin: Skin is warm and dry. No rash  noted. She is not diaphoretic. No erythema. No pallor.  Psychiatric: She has a normal mood and affect. Her behavior is normal. Judgment and thought content normal.     Assessment/Plan Post surgical RUQ  pain: Ct of abdomen and pelvis negative for abscess.  Normal labs including white count and LFTs.  It would be unlikely for her to have a bile leak this far out, however, given that she reports constant pain that has now worsened we will proceed with a HIDA scan to rule out a bile leak.  If negative, may go home with pain meds and previous lifting restrictions.    Legend Pecore ANP-BC 03/27/2013, 10:33 AM

## 2013-03-27 NOTE — ED Notes (Signed)
General surgery PA at bedside 

## 2013-03-27 NOTE — ED Notes (Signed)
Results of lactic acid shown to Dr. Regenia Skeeter

## 2013-03-27 NOTE — ED Provider Notes (Signed)
CSN: [id number]     Arrival date & time 03/27/13  0804 History   First MD Initiated Contact with Patient 03/27/13 [phone]     Chief Complaint  Patient presents with  . Abdominal Pain  . Nausea   (Consider location/radiation/quality/duration/timing/severity/associated sxs/prior Treatment) HPI Comments: 58 year old female presents with a partially 6-7 hours of right-sided abdominal pain. She states 9 days ago she had her gallbladder removed. She states since then she's been having pain around a 6/10. This pretty well controlled with oxycodone at home. Patient states she acutely had pain that worsened and woke her up this morning. This is also been nauseous without vomiting. Denies any fevers. She has had some dysuria for 3 days. This pain feels similar but worse when she had gallbladder problems. She thought it may been a muscle strain because several hours earlier she lifted a case of water. However the pain started about 3 hours after that. Patient feels like she is also a bit distended has not had a bowel movement a couple days. He is currently a 10 out of 10. She took one Percocet this morning but did not seem to help.   Past Medical History  Diagnosis Date  . Vitamin D deficiency   . History of chicken pox   . H/O mumps   . H/O measles   . Panic attacks 2005  . Allergy   . GERD (gastroesophageal reflux disease)   . Wears partial dentures     upper partial   Past Surgical History  Procedure Laterality Date  . Appendectomy    . Tubal ligation    . Bunionectomy Right 05/01/12    [place], right foot  . Remove implant deep Right 05/01/12/    Right foot  . Colonoscopy    . Cholecystectomy N/A 03/18/2013    Procedure: LAPAROSCOPIC CHOLECYSTECTOMY;  Surgeon: Harl Bowie, MD;  Location: [place];  Service: General;  Laterality: N/A;   Family History  Problem Relation Age of Onset  . Hypertension Mother   . Diabetes Mother   . Heart disease Sister   . Hypertension  Sister   . Hypertension Brother   . Heart disease Maternal Aunt   . Cancer Maternal Grandmother    History  Substance Use Topics  . Smoking status: Never Smoker   . Smokeless tobacco: Never Used  . Alcohol Use: No   OB History   Grav Para Term Preterm Abortions TAB SAB Ect Mult Living   2 1 1       1      Review of Systems  Constitutional: Negative for fever.  Respiratory: Negative for shortness of breath.   Cardiovascular: Negative for chest pain.  Gastrointestinal: Positive for nausea, abdominal pain and constipation. Negative for vomiting and diarrhea.  Genitourinary: Positive for dysuria. Negative for hematuria.  Musculoskeletal: Positive for back pain.  All other systems reviewed and are negative.    Allergies  Codeine and Asa  Home Medications   Current Outpatient Rx  Name  Route  Sig  Dispense  Refill  . Ascorbic Acid (VITAMIN C) 1000 MG tablet   Oral   Take 1,000 mg by mouth daily.         . cyclobenzaprine (FLEXERIL) 10 MG tablet   Oral   Take 1 tablet (10 mg total) by mouth at bedtime.   30 tablet   0   . fish oil-omega-3 fatty acids 1000 MG capsule   Oral   Take 2 g by mouth  daily.         . ibuprofen (ADVIL,MOTRIN) 800 MG tablet   Oral   Take 1 tablet (800 mg total) by mouth every 8 (eight) hours as needed for pain.   60 tablet   0   . meloxicam (MOBIC) 15 MG tablet   Oral   Take 1 tablet (15 mg total) by mouth daily.   30 tablet   0   . naproxen sodium (ANAPROX DS) 550 MG tablet   Oral   Take 1 tablet (550 mg total) by mouth 2 (two) times daily with a meal.   40 tablet   0   . NEXIUM 40 MG capsule               . oxyCODONE-acetaminophen (ROXICET) 5-325 MG per tablet   Oral   Take 1-2 tablets by mouth every 4 (four) hours as needed for severe pain.   40 tablet   0    There were no vitals taken for this visit. Physical Exam  Nursing note and vitals reviewed. Constitutional: She is oriented to person, place, and time. She  appears well-developed and well-nourished. No distress.  HENT:  Head: Normocephalic and atraumatic.  Right Ear: External ear normal.  Left Ear: External ear normal.  Nose: Nose normal.  Eyes: Right eye exhibits no discharge. Left eye exhibits no discharge.  Cardiovascular: Normal rate, regular rhythm and normal heart sounds.   Pulmonary/Chest: Effort normal and breath sounds normal.  Abdominal: Soft. There is tenderness ( generalized tenderness, worse in the right upper quadrant). There is CVA tenderness (right sided).  Neurological: She is alert and oriented to person, place, and time.  Skin: Skin is warm and dry.    ED Course  Procedures (including critical care time) Labs Review Labs Reviewed  COMPREHENSIVE METABOLIC PANEL - Abnormal; Notable for the following:    Glucose, Bld 102 (*)    All other components within normal limits  URINALYSIS, ROUTINE W REFLEX MICROSCOPIC - Abnormal; Notable for the following:    Hgb urine dipstick SMALL (*)    All other components within normal limits  CBC WITH DIFFERENTIAL  LIPASE, BLOOD  URINE MICROSCOPIC-ADD ON  CG4 I-STAT (LACTIC ACID)   Imaging Review Nm Hepatobiliary Liver Func  03/27/2013   CLINICAL DATA:  Rule out bile leak  EXAM: NUCLEAR MEDICINE HEPATOBILIARY IMAGING  TECHNIQUE: Sequential images of the abdomen were obtained out to 120 minutes following intravenous administration of radiopharmaceutical.  COMPARISON:  None.  RADIOPHARMACEUTICALS:  5.0 mCi Tc-53m Choletec  FINDINGS: There is uniform uptake throughout the liver with removal from the blood pool. Radiotracer excretion into the biliary tree and duodenum as well as jejunum is demonstrated. No evidence of bile leak. Specifically, no evidence of radiotracer uptake beyond the confines of the liver, biliary tree, or bowel.  IMPRESSION: No evidence of bile leak.   Electronically Signed   By: Maryclare Bean M.D.   On: 03/27/2013 14:55   Ct Abdomen Pelvis W Contrast  03/27/2013   CLINICAL  DATA:  Abdominal pain, history of recent cholecystectomy  EXAM: CT ABDOMEN AND PELVIS WITH CONTRAST  TECHNIQUE: Multidetector CT imaging of the abdomen and pelvis was performed using the standard protocol following bolus administration of intravenous contrast.  CONTRAST:  133mL OMNIPAQUE IOHEXOL 300 MG/ML  SOLN  COMPARISON:  CT abdomen pelvis-11/28/2010; nuclear medicine HIDA scan - 02/16/2013; abdominal ultrasound - 02/16/2013  FINDINGS: Normal hepatic contour. Grossly unchanged appearance of approximately 1.4 x 1.2 cm hypo attenuating nonenhancing  cyst within the anterior segment of the right lobe of the liver adjacent to the gallbladder fossa (image 25, series 2). Post cholecystectomy. There is a very minimal amount of stranding within the gallbladder fossa extending to the caudal aspect of the right lobe of the liver. No discrete definable/drainable fluid collections. No definite intra or extrahepatic biliary ductal dilatation. No ascites.  There is symmetric enhancement and excretion of the bilateral kidneys. There is a suspected punctate (approximately 0.4 cm) nonobstructing renal stone within the inferior pole of the left kidney (coronal image 108, series 5). No urinary obstruction or perinephric stranding. Normal appearance of the bilateral adrenal glands, pancreas and spleen.  Ingestion enteric contrast extends to the level of the mid small bowel. The bowel is normal in course in caliber without wall thickening or evidence of obstruction. The appendix is not visualized compatible with provided surgical history. No pneumoperitoneum, pneumatosis or portal venous gas. No definable/ drainable intra-abdominal fluid collection.  Normal caliber the abdominal aorta. The major branch vessels of the abdominal aorta are patent on this non CTA examination. Scattered shotty retroperitoneal lymph nodes are individually not enlarged by size criteria with index aortocaval lymph node measuring approximately 0.6 cm in  greatest short axis diameter (image 48, series 2). No definite retroperitoneal, mesenteric, pelvic or inguinal lymphadenopathy.  Normal appearance of the pelvic organs. No discrete adnexal mass. There is minimal reflux of contrast into a mildly hypertrophied left gonadal vein with filling of several mildly prominent varicosities within the left hemipelvis. No free fluid within the pelvis.  Evaluation of the imaged lower thorax is degraded secondary to patient respiratory artifact. There are minimal subpleural ground-glass opacities within the dependent portion of the bilateral lower lobes. There is minimal subsegmental atelectasis within the image caudal segment of the lingula. No discrete focal airspace opacities. No pleural effusion.  Normal heart size.  No pericardial effusion.  No acute or aggressive osseus abnormalities. Mild to moderate multilevel lumbar spine DDD, worse at L5-S1 with disc space height loss, endplate irregularity and small posteriorly directed disc osteophyte complex at this location. Bilateral facet degenerative change in the lower lumbar spine.  There is minimal stranding about the umbilicus, likely at a laparoscopy site. No associated definable/drainable fluid collection. No associated mesenteric hernia.  IMPRESSION: 1. Post cholecystectomy without evidence of complication. Specifically, no evidence of definable/drainable fluid collection. No intra or extrahepatic biliary ductal dilatation. 2. Minimal amount of stranding about the umbilicus, presumably at a laparoscopy port site. 3. Suspected punctate (approximately 0.4 cm) nonobstructing stone within the inferior pole of the left kidney. 4. Retrograde flow into a mildly hypertrophied left gonadal vein with filling of several mildly prominent left sided adnexal varicosities, nonspecific though could be seen in the setting of pelvic congestion syndrome. Clinical correlation is advised.   Electronically Signed   By: Sandi Mariscal M.D.   On:  03/27/2013 09:49    EKG Interpretation   None       MDM   1. Abdominal pain    [name] pain improved with IV narcotics in ED. Has soft but tender abd. C/o constipation but has a mild BM this AM. Given recent cholecystectomy, CT obtained, it and LFTs are normal. D/w surgery, who ordered HIDA, which shows no evidence of bile leak. As her pain improved and there is no acute intervention needed, she was deemed stable for discharge home. Patient ok with going home, understands return precautions. Surgery has written for further pain meds for patient.  Ephraim Hamburger, MD 03/27/13 2103

## 2013-03-27 NOTE — ED Notes (Signed)
Pt c/o right side pain onset 2am today. Pt had gallbladder surgery last Wednesday and lifted something heavy yesterday and was out in the cold yesterday. Pt reports severe nausea.

## 2013-03-27 NOTE — Discharge Instructions (Signed)
Abdominal Pain, Adult °Many things can cause abdominal pain. Usually, abdominal pain is not caused by a disease and will improve without treatment. It can often be observed and treated at home. Your health care provider will do a physical exam and possibly order blood tests and X-rays to help determine the seriousness of your pain. However, in many cases, more time must pass before a clear cause of the pain can be found. Before that point, your health care provider may not know if you need more testing or further treatment. °HOME CARE INSTRUCTIONS  °Monitor your abdominal pain for any changes. The following actions may help to alleviate any discomfort you are experiencing: °· Only take over-the-counter or prescription medicines as directed by your health care provider. °· Do not take laxatives unless directed to do so by your health care provider. °· Try a clear liquid diet (broth, tea, or water) as directed by your health care provider. Slowly move to a bland diet as tolerated. °SEEK MEDICAL CARE IF: °· You have unexplained abdominal pain. °· You have abdominal pain associated with nausea or diarrhea. °· You have pain when you urinate or have a bowel movement. °· You experience abdominal pain that wakes you in the night. °· You have abdominal pain that is worsened or improved by eating food. °· You have abdominal pain that is worsened with eating fatty foods. °SEEK IMMEDIATE MEDICAL CARE IF:  °· Your pain does not go away within 2 hours. °· You have a fever. °· You keep throwing up (vomiting). °· Your pain is felt only in portions of the abdomen, such as the right side or the left lower portion of the abdomen. °· You pass bloody or black tarry stools. °MAKE SURE YOU: °· Understand these instructions.   °· Will watch your condition.   °· Will get help right away if you are not doing well or get worse.   °Document Released: 11/29/2004 Document Revised: 12/10/2012 Document Reviewed: 10/29/2012 °[employer]® Patient  Information ©2014 [employer]. ° °

## 2013-03-27 NOTE — H&P (Signed)
Labs are normal, CT and HIDA are normal. Does not need admission  Needs outpt follow up, especially with GI (Dr. Collene Mares)

## 2013-04-20 ENCOUNTER — Encounter (INDEPENDENT_AMBULATORY_CARE_PROVIDER_SITE_OTHER): Payer: BC Managed Care – PPO | Admitting: Surgery

## 2013-04-22 ENCOUNTER — Encounter (INDEPENDENT_AMBULATORY_CARE_PROVIDER_SITE_OTHER): Payer: Self-pay | Admitting: Surgery

## 2013-09-22 ENCOUNTER — Other Ambulatory Visit: Payer: Self-pay | Admitting: Obstetrics and Gynecology

## 2013-09-22 DIAGNOSIS — Z1231 Encounter for screening mammogram for malignant neoplasm of breast: Secondary | ICD-10-CM

## 2013-10-14 ENCOUNTER — Ambulatory Visit (HOSPITAL_COMMUNITY)
Admission: RE | Admit: 2013-10-14 | Discharge: 2013-10-14 | Disposition: A | Discharge status: Home / Self Care | Payer: BC Managed Care – PPO | Source: Ambulatory Visit | Attending: Obstetrics and Gynecology | Admitting: Obstetrics and Gynecology

## 2013-10-14 DIAGNOSIS — Z1231 Encounter for screening mammogram for malignant neoplasm of breast: Secondary | ICD-10-CM

## 2014-01-04 ENCOUNTER — Encounter (HOSPITAL_COMMUNITY): Payer: Self-pay | Admitting: Emergency Medicine

## 2014-03-12 ENCOUNTER — Ambulatory Visit (INDEPENDENT_AMBULATORY_CARE_PROVIDER_SITE_OTHER): Payer: BC Managed Care – PPO

## 2014-03-12 ENCOUNTER — Ambulatory Visit (INDEPENDENT_AMBULATORY_CARE_PROVIDER_SITE_OTHER): Payer: BC Managed Care – PPO | Admitting: Internal Medicine

## 2014-03-12 VITALS — BP 118/82 | HR 79 | Temp 98.6°F | Resp 18 | Ht 63.75 in | Wt 214.2 lb

## 2014-03-12 DIAGNOSIS — R059 Cough, unspecified: Secondary | ICD-10-CM

## 2014-03-12 DIAGNOSIS — R070 Pain in throat: Secondary | ICD-10-CM

## 2014-03-12 DIAGNOSIS — R079 Chest pain, unspecified: Secondary | ICD-10-CM

## 2014-03-12 DIAGNOSIS — R05 Cough: Secondary | ICD-10-CM

## 2014-03-12 LAB — POCT CBC
Granulocyte percent: 74.8 %G (ref 37–80)
HCT, POC: 42.6 % (ref 37.7–47.9)
Hemoglobin: 13.5 g/dL (ref 12.2–16.2)
LYMPH, POC: 2.5 (ref 0.6–3.4)
MCH, POC: 26.7 pg — AB (ref 27–31.2)
MCHC: 31.8 g/dL (ref 31.8–35.4)
MCV: 84.1 fL (ref 80–97)
MID (CBC): 0.8 (ref 0–0.9)
MPV: 8.2 fL (ref 0–99.8)
PLATELET COUNT, POC: 374 10*3/uL (ref 142–424)
POC GRANULOCYTE: 9.7 — AB (ref 2–6.9)
POC LYMPH PERCENT: 19.2 %L (ref 10–50)
POC MID %: 6 %M (ref 0–12)
RBC: 5.06 M/uL (ref 4.04–5.48)
RDW, POC: 15.5 %
WBC: 13 10*3/uL — AB (ref 4.6–10.2)

## 2014-03-12 MED ORDER — ALBUTEROL SULFATE HFA 108 (90 BASE) MCG/ACT IN AERS: 2.0000 | INHALATION_SPRAY | Freq: Four times a day (QID) | RESPIRATORY_TRACT | Status: DC | PRN

## 2014-03-12 MED ORDER — AZITHROMYCIN 500 MG PO TABS: 500.0000 mg | ORAL_TABLET | Freq: Every day | ORAL | Status: DC

## 2014-03-12 MED ORDER — HYDROCODONE-ACETAMINOPHEN 7.5-325 MG/15ML PO SOLN: 5.0000 mL | Freq: Four times a day (QID) | ORAL | Status: DC | PRN

## 2014-03-12 NOTE — Patient Instructions (Signed)
Cough, Adult  A cough is a reflex that helps clear your throat and airways. It can help heal the body or may be a reaction to an irritated airway. A cough may only last 2 or 3 weeks (acute) or may last more than 8 weeks (chronic).  CAUSES Acute cough:  Viral or bacterial infections. Chronic cough:  Infections.  Allergies.  Asthma.  Post-nasal drip.  Smoking.  Heartburn or acid reflux.  Some medicines.  Chronic lung problems (COPD).  Cancer. SYMPTOMS   Cough.  Fever.  Chest pain.  Increased breathing rate.  High-pitched whistling sound when breathing (wheezing).  Colored mucus that you cough up (sputum). TREATMENT   A bacterial cough may be treated with antibiotic medicine.  A viral cough must run its course and will not respond to antibiotics.  Your caregiver may recommend other treatments if you have a chronic cough. HOME CARE INSTRUCTIONS   Only take over-the-counter or prescription medicines for pain, discomfort, or fever as directed by your caregiver. Use cough suppressants only as directed by your caregiver.  Use a cold steam vaporizer or humidifier in your bedroom or home to help loosen secretions.  Sleep in a semi-upright position if your cough is worse at night.  Rest as needed.  Stop smoking if you smoke. SEEK IMMEDIATE MEDICAL CARE IF:   You have pus in your sputum.  Your cough starts to worsen.  You cannot control your cough with suppressants and are losing sleep.  You begin coughing up blood.  You have difficulty breathing.  You develop pain which is getting worse or is uncontrolled with medicine.  You have a fever. MAKE SURE YOU:   Understand these instructions.  Will watch your condition.  Will get help right away if you are not doing well or get worse. Document Released: 08/18/2010 Document Revised: 05/14/2011 Document Reviewed: 08/18/2010 [facility] Patient Information 2015 [place], [place]. This information is not intended  to replace advice given to you by your health care provider. Make sure you discuss any questions you have with your health care provider. Pharyngitis Pharyngitis is redness, pain, and swelling (inflammation) of your pharynx.  CAUSES  Pharyngitis is usually caused by infection. Most of the time, these infections are from viruses (viral) and are part of a cold. However, sometimes pharyngitis is caused by bacteria (bacterial). Pharyngitis can also be caused by allergies. Viral pharyngitis may be spread from person to person by coughing, sneezing, and personal items or utensils (cups, forks, spoons, toothbrushes). Bacterial pharyngitis may be spread from person to person by more intimate contact, such as kissing.  SIGNS AND SYMPTOMS  Symptoms of pharyngitis include:   Sore throat.   Tiredness (fatigue).   Low-grade fever.   Headache.  Joint pain and muscle aches.  Skin rashes.  Swollen lymph nodes.  Plaque-like film on throat or tonsils (often seen with bacterial pharyngitis). DIAGNOSIS  Your health care provider will ask you questions about your illness and your symptoms. Your medical history, along with a physical exam, is often all that is needed to diagnose pharyngitis. Sometimes, a rapid strep test is done. Other lab tests may also be done, depending on the suspected cause.  TREATMENT  Viral pharyngitis will usually get better in 3-4 days without the use of medicine. Bacterial pharyngitis is treated with medicines that kill germs (antibiotics).  HOME CARE INSTRUCTIONS   Drink enough water and fluids to keep your urine clear or pale yellow.   Only take over-the-counter or prescription medicines as directed  by your health care provider:   If you are prescribed antibiotics, make sure you finish them even if you start to feel better.   Do not take aspirin.   Get lots of rest.   Gargle with 8 oz of salt water ( tsp of salt per 1 qt of water) as often as every 1-2 hours to  soothe your throat.   Throat lozenges (if you are not at risk for choking) or sprays may be used to soothe your throat. SEEK MEDICAL CARE IF:   You have large, tender lumps in your neck.  You have a rash.  You cough up green, yellow-brown, or bloody spit. SEEK IMMEDIATE MEDICAL CARE IF:   Your neck becomes stiff.  You drool or are unable to swallow liquids.  You vomit or are unable to keep medicines or liquids down.  You have severe pain that does not go away with the use of recommended medicines.  You have trouble breathing (not caused by a stuffy nose). MAKE SURE YOU:   Understand these instructions.  Will watch your condition.  Will get help right away if you are not doing well or get worse. Document Released: 02/19/2005 Document Revised: 12/10/2012 Document Reviewed: 10/27/2012 [facility] Patient Information 2015 [place], [place]. This information is not intended to replace advice given to you by your health care provider. Make sure you discuss any questions you have with your health care provider.

## 2014-03-12 NOTE — Progress Notes (Signed)
   Subjective:    Patient ID: Kelly Abbott, female    DOB: Apr 04, 1955, 59 y.o.   MRN: [id number]  HPI Cough, very sore throat with cough, green and bloody sputum, sweats for 6 d. Has cp with cough, no sob. No asthma or lung disease.   Review of Systems     Objective:   Physical Exam  Constitutional: She is oriented to person, place, and time. She appears well-nourished. She appears distressed.  HENT:  Head: Normocephalic.  Right Ear: External ear normal.  Left Ear: External ear normal.  Nose: Mucosal edema and rhinorrhea present. Epistaxis is observed.  Mouth/Throat: Oropharynx is clear and moist.  Eyes: Conjunctivae and EOM are normal. Pupils are equal, round, and reactive to light.  Neck: Normal range of motion. Neck supple. No thyromegaly present.  Cardiovascular: Normal rate.   Pulmonary/Chest: Effort normal. No tachypnea. No respiratory distress. She has no decreased breath sounds. She has no wheezes. She has rhonchi. She has no rales. She exhibits tenderness.  Lymphadenopathy:    She has cervical adenopathy.  Neurological: She is alert and oriented to person, place, and time. She exhibits normal muscle tone. Coordination normal.  Skin: She is diaphoretic.  Psychiatric: She has a normal mood and affect.     [facility] reading (PRIMARY) by  Dr.Rekha Hobbins normal cxr Results for orders placed or performed in visit on 03/12/14  POCT CBC  Result Value Ref Range   WBC 13.0 (A) 4.6 - 10.2 K/uL   Lymph, poc 2.5 0.6 - 3.4   POC LYMPH PERCENT 19.2 10 - 50 %L   MID (cbc) 0.8 0 - 0.9   POC MID % 6.0 0 - 12 %M   POC Granulocyte 9.7 (A) 2 - 6.9   Granulocyte percent 74.8 37 - 80 %G   RBC 5.06 4.04 - 5.48 M/uL   Hemoglobin 13.5 12.2 - 16.2 g/dL   HCT, POC 42.6 37.7 - 47.9 %   MCV 884.1 (A) 80 - 97 fL   MCH, POC 26.7 (A) 27 - 31.2 pg   MCHC 31.8 31.8 - 35.4 g/dL   RDW, POC 15.5 %   Platelet Count, POC 374 142 - 424 K/uL   MPV 8.2 0 - 99.8 fL   MCV actually 84.0        Assessment & Plan:  Bronchitis/Pharyngitis/Sweats Zithromax 500mg /Lortab elixir for pain and cough--take with food prevent nausea

## 2014-07-27 ENCOUNTER — Other Ambulatory Visit: Payer: Self-pay | Admitting: Obstetrics and Gynecology

## 2014-07-27 LAB — CBC AND DIFFERENTIAL
HCT: 39 % (ref 36–46)
Hemoglobin: 12.8 g/dL (ref 12.0–16.0)
Platelets: 401 10*3/uL — AB (ref 150–399)
WBC: 10.2 10^3/mL

## 2014-08-04 ENCOUNTER — Encounter: Payer: Self-pay | Admitting: Physician Assistant

## 2014-08-04 ENCOUNTER — Telehealth: Payer: Self-pay | Admitting: Internal Medicine

## 2014-08-04 NOTE — Telephone Encounter (Signed)
I have left message for the patient to call back. She hasn't been seen here since 2006. Her medication list includes Nexium last filled 2014 by the record.

## 2014-08-06 NOTE — Telephone Encounter (Signed)
Left a message again for patient to call back.

## 2014-08-09 NOTE — Telephone Encounter (Signed)
Patient states she is having problems swallowing even water. Moved OV with Lori Hvozdovic, PA-C to 08/13/14 at 2:30 PM.

## 2014-08-12 ENCOUNTER — Encounter: Payer: Self-pay | Admitting: Gastroenterology

## 2014-08-13 ENCOUNTER — Encounter: Payer: Self-pay | Admitting: Physician Assistant

## 2014-08-13 ENCOUNTER — Encounter (INDEPENDENT_AMBULATORY_CARE_PROVIDER_SITE_OTHER): Payer: Self-pay

## 2014-08-13 ENCOUNTER — Ambulatory Visit (INDEPENDENT_AMBULATORY_CARE_PROVIDER_SITE_OTHER): Payer: BC Managed Care – PPO | Admitting: Physician Assistant

## 2014-08-13 VITALS — BP 124/74 | HR 80 | Ht 62.75 in | Wt 210.0 lb

## 2014-08-13 DIAGNOSIS — K5909 Other constipation: Secondary | ICD-10-CM | POA: Diagnosis not present

## 2014-08-13 DIAGNOSIS — K219 Gastro-esophageal reflux disease without esophagitis: Secondary | ICD-10-CM

## 2014-08-13 DIAGNOSIS — R1314 Dysphagia, pharyngoesophageal phase: Secondary | ICD-10-CM | POA: Diagnosis not present

## 2014-08-13 MED ORDER — RANITIDINE HCL 300 MG PO CAPS: 300.0000 mg | ORAL_CAPSULE | Freq: Every evening | ORAL | Status: DC

## 2014-08-13 NOTE — Patient Instructions (Addendum)
We sent a prescription for Ranitidine 300 mg to [address].  We have given you brochures on Gerd and Dysphaghia. Take Benefiber, 1 heaping tablespoon in 8 0z of water at breakfast and again before noon,. Eat from the "P Fruit" family. Prunies, pears, peaches, plums, pineapple. Also drink pear juice.   You have been scheduled for an endoscopy. Please follow written instructions given to you at your visit today. If you use inhalers (even only as needed), please bring them with you on the day of your procedure. Your physician has requested that you go to www.startemmi.com and enter the access code given to you at your visit today. This web site gives a general overview about your procedure. However, you should still follow specific instructions given to you by our office regarding your preparation for the procedure.  You have been scheduled for a Barium Esophogram at [facility] (1st floor of the hospital) on 08-25-2014 at . Please  Arrive at 9:15 am prior to your appointment for registration. Make certain not to have anything to eat or drink 4 hours prior to your test. If you need to reschedule for any reason, please contact radiology at [phone] to do so. __________________________________________________________________ A barium swallow is an examination that concentrates on views of the esophagus. This tends to be a double contrast exam (barium and two liquids which, when combined, create a gas to distend the wall of the oesophagus) or single contrast (non-ionic iodine based). The study is usually tailored to your symptoms so a good history is essential. Attention is paid during the study to the form, structure and configuration of the esophagus, looking for functional disorders (such as aspiration, dysphagia, achalasia, motility and reflux) EXAMINATION You may be asked to change into a gown, depending on the type of swallow being performed. A radiologist and radiographer  will perform the procedure. The radiologist will advise you of the type of contrast selected for your procedure and direct you during the exam. You will be asked to stand, sit or lie in several different positions and to hold a small amount of fluid in your mouth before being asked to swallow while the imaging is performed .In some instances you may be asked to swallow barium coated marshmallows to assess the motility of a solid food bolus. The exam can be recorded as a digital or video fluoroscopy procedure. POST PROCEDURE It will take 1-2 days for the barium to pass through your system. To facilitate this, it is important, unless otherwise directed, to increase your fluids for the next 24-48hrs and to resume your normal diet.  This test typically takes about 30 minutes to perform. __________________________________________________________________________________

## 2014-08-14 ENCOUNTER — Encounter: Payer: Self-pay | Admitting: Physician Assistant

## 2014-08-14 NOTE — Progress Notes (Signed)
Patient ID: Kelly Abbott, female   DOB: [birth year], 59 y.o.   MRN: [id number]    HPI:  Kelly Abbott is a 59 y.o.   female referred by Everett Graff, MD for evaluation of GERD, dysphagia, and constipation.  Kelly Abbott reports that she has had trouble with reflux for much of her adult life. She hasn't seen Dr. Maurene Capes many years ago and had an EGD on 06/08/2004 that showed a small hiatal hernia. She was placed on Protonix 40 mg daily with which she had good relief. After a while she felt better so she discontinued her Protonix. She was evaluated by Dr. Collene Mares 2 years ago for screening colonoscopy and had her colonoscopy and states no polyps were removed and she was advised to have a repeat colonoscopy in 10 years. When she was evaluated by Dr. Collene Mares she was having heartburn and was started on Nexium, which provided excellent relief of her heartburn. She took it for a year but then stopped it because she saw a commercial on TV about Nexium that scared her. Since then she has been using over-the-counter Zantac as needed with varying degrees of relief. She has been having dysphagia to solids and liquids for about a year and it is becoming progressively more frequent and more severe. She has had episodes where she has had to spit her food up because it will not go down. She does get nocturnal regurgitation.  She also reports that she has been intermittently constipated for much of her life. Review of her EGD from April 2006 shows that she was given a trial of Zelnorm along with Mira lax. She does not recall if any of this helped. She states that she does have 1 bowel movement daily but it is very hard and nugget-like and she has to strain. She reports that the only thing that helps her have a bowel movement is gram crackers with peanut butter along with the senna tablet daily. She has had no bright red blood per rectum or melena. Her appetite is good and her weight has been stable. She reports that  her maternal uncle had colon cancer and a paternal uncle had lung cancer. Her father died of black lung and her mother is alive at 73 with dementia.   Past Medical History  Diagnosis Date  . Vitamin D deficiency   . History of chicken pox   . H/O mumps   . H/O measles   . Panic attacks 2005  . Allergy   . GERD (gastroesophageal reflux disease)   . Wears partial dentures     upper partial  . Gastric ulcer   . Anxiety     Past Surgical History  Procedure Laterality Date  . Appendectomy    . Tubal ligation    . Bunionectomy Right 05/01/12    [place], right foot  . Remove implant deep Right 05/01/12/    Right foot  . Colonoscopy    . Cholecystectomy N/A 03/18/2013    Procedure: LAPAROSCOPIC CHOLECYSTECTOMY;  Surgeon: Harl Bowie, MD;  Location: [facility];  Service: General;  Laterality: N/A;  . Endometrial biopsy  07/27/14  . Bunionectomy Left    Family History  Problem Relation Age of Onset  . Hypertension Mother   . Diabetes Mother   . Heart disease Sister   . Hypertension Sister   . Hypertension Brother   . Heart disease Maternal Aunt   . Cancer Maternal Grandmother     female cancer ? type  .  Heart attack Brother     x 3  . Colon cancer Maternal Grandfather   . Prostate cancer Maternal Uncle   . Lung cancer Paternal Uncle   . Clotting disorder Brother    History  Substance Use Topics  . Smoking status: Never Smoker   . Smokeless tobacco: Never Used  . Alcohol Use: No   Current Outpatient Prescriptions  Medication Sig Dispense Refill  . Cholecalciferol (VITAMIN D3) 50000 UNITS CAPS Take 1 capsule by mouth once a week.    . Cyanocobalamin (VITAMIN B-12 PO) Take 1 tablet by mouth daily.    . fish oil-omega-3 fatty acids 1000 MG capsule Take 1 g by mouth daily.     Marland Kitchen ibuprofen (ADVIL,MOTRIN) 800 MG tablet Take 1 tablet (800 mg total) by mouth every 8 (eight) hours as needed for pain. 60 tablet 0  . norethindrone (AYGESTIN) 5 MG tablet Take 1  tablet by mouth daily.    Marland Kitchen senna (SENOKOT) 8.6 MG TABS tablet Take 1 tablet by mouth daily.    . ranitidine (ZANTAC) 300 MG capsule Take 1 capsule (300 mg total) by mouth every evening. 30 capsule 6   No current facility-administered medications for this visit.   Allergies  Allergen Reactions  . Codeine Nausea Only  . Asa [Aspirin] Hives     Review of Systems: Gen: Denies any fever, chills, sweats, anorexia, fatigue, weakness, malaise, weight loss, and sleep disorder CV: Denies chest pain, angina, palpitations, syncope, orthopnea, PND, peripheral edema, and claudication. Resp: Denies dyspnea at rest, dyspnea with exercise, cough, sputum, wheezing, coughing up blood, and pleurisy. GI: Denies vomiting blood, jaundice, and fecal incontinence.  Has dysphagia to solids and liquids GU : Denies urinary burning, blood in urine, urinary frequency, urinary hesitancy, nocturnal urination, and urinary incontinence. MS: Denies joint pain, limitation of movement, and swelling, stiffness, low back pain, extremity pain. Denies muscle weakness, cramps, atrophy.  Derm: Denies rash, itching, dry skin, hives, moles, warts, or unhealing ulcers.  Psych: Denies depression, anxiety, memory loss, suicidal ideation, hallucinations, paranoia, and confusion. Heme: Denies bruising, bleeding, and enlarged lymph nodes. Neuro:  Denies any headaches, dizziness, paresthesias. Endo:  Denies any problems with DM, thyroid, adrenal function    LAB RESULTS: CBC 07/27/2014 white count 10.2, hemoglobin 12.8, hematocrit 39.3, platelets 401,000.  Prior Endoscopies:  EGD 06/08/2004 showed a hiatal hernia.  Physical Exam: BP 124/74 mmHg  Pulse 80  Ht 5' 2.75" (1.594 m)  Wt 210 lb (95.255 kg)  BMI 37.49 kg/m2  LMP 01/08/2011 Constitutional: Pleasant,well-developed female in no acute distress. HEENT: Normocephalic and atraumatic. Conjunctivae are normal. No scleral icterus. Neck supple. No thyromegaly Cardiovascular:  Normal rate, regular rhythm.  Pulmonary/chest: Effort normal and breath sounds normal. No wheezing, rales or rhonchi. Abdominal: Soft, nondistended, nontender. Bowel sounds active throughout. There are no masses palpable. No hepatomegaly. Extremities: no edema Lymphadenopathy: No cervical adenopathy noted. Neurological: Alert and oriented to person place and time. Skin: Skin is warm and dry. No rashes noted. Psychiatric: Normal mood and affect. Behavior is normal.  ASSESSMENT AND PLAN: #1 GERD. An antireflux regimen has been reviewed. The patient strongly prefers not to use PPI as she is fearful of side effects. She will be given a trial of ranitidine 300 mg by mouth daily at bedtime.  #2. Dysphagia. Likely due to poorly controlled reflux. She will be scheduled for a barium swallow with tablet to evaluate for possible stricture. She will then be scheduled for an EGD to evaluate for esophagitis, strictures,  gastritis, ulcer, etc.The risks, benefits, and alternatives to endoscopy with possible biopsy and possible dilation were discussed with the patient and they consent to proceed.  The procedure will be scheduled with Dr. Olevia Perches.  #3. Constipation. Patient has been instructed to use Benefiber 1 heaping tablespoon in 8 ounces of water at breakfast and later in the morning before noon on a daily basis. She will add "P fruits" to her diet. She is to increase water and fiber. She will continue Senokot at bedtime. She has signed a medical release to get of her colonoscopy from Dr. Collene Mares.  Patient also reports she will stop at [facility] primary care downstairs on her weight out to schedule an appointment to establish care with primary care provider. Further recommendations will be made pending the findings of her endoscopy.    Kelly Abbott, Kelly Abbott 08/14/2014, 8:26 PM  CC: Everett Graff, MD

## 2014-08-15 NOTE — Progress Notes (Signed)
Reviewed and agree, I will see her for EGD

## 2014-08-18 ENCOUNTER — Telehealth: Payer: Self-pay | Admitting: *Deleted

## 2014-08-18 NOTE — Telephone Encounter (Signed)
[place] and spoke to [facility]. I had faxed a signed release to them on Monday 08-13-14 and I called to ask for records. Received colon and path, dated 12/19/2-14.  Gave them to [employer] . She will review and I will then send them to be scanned.

## 2014-08-18 NOTE — Telephone Encounter (Signed)
Date error on previous noted dated 08-18-2014.  Date of colon and path is 02/20/2013. ( from Dr. Lorie Apley office received by fax today)

## 2014-08-23 ENCOUNTER — Ambulatory Visit (HOSPITAL_COMMUNITY)
Admission: RE | Admit: 2014-08-23 | Discharge: 2014-08-23 | Disposition: A | Discharge status: Home / Self Care | Payer: BC Managed Care – PPO | Source: Ambulatory Visit | Attending: Physician Assistant | Admitting: Physician Assistant

## 2014-08-23 DIAGNOSIS — K5909 Other constipation: Secondary | ICD-10-CM

## 2014-08-23 DIAGNOSIS — R1314 Dysphagia, pharyngoesophageal phase: Secondary | ICD-10-CM

## 2014-08-23 DIAGNOSIS — K219 Gastro-esophageal reflux disease without esophagitis: Secondary | ICD-10-CM | POA: Insufficient documentation

## 2014-08-23 DIAGNOSIS — R1310 Dysphagia, unspecified: Secondary | ICD-10-CM | POA: Diagnosis present

## 2014-08-23 DIAGNOSIS — K449 Diaphragmatic hernia without obstruction or gangrene: Secondary | ICD-10-CM | POA: Diagnosis not present

## 2014-08-25 ENCOUNTER — Ambulatory Visit (HOSPITAL_COMMUNITY): Payer: BC Managed Care – PPO

## 2014-08-25 ENCOUNTER — Ambulatory Visit: Payer: BC Managed Care – PPO | Admitting: Physician Assistant

## 2014-08-27 ENCOUNTER — Encounter: Payer: Self-pay | Admitting: Internal Medicine

## 2014-08-27 ENCOUNTER — Ambulatory Visit (AMBULATORY_SURGERY_CENTER): Payer: BC Managed Care – PPO | Admitting: Internal Medicine

## 2014-08-27 VITALS — BP 94/77 | HR 70 | Temp 98.0°F | Resp 19 | Ht 62.75 in | Wt 210.0 lb

## 2014-08-27 DIAGNOSIS — K219 Gastro-esophageal reflux disease without esophagitis: Secondary | ICD-10-CM

## 2014-08-27 DIAGNOSIS — K209 Esophagitis, unspecified without bleeding: Secondary | ICD-10-CM

## 2014-08-27 MED ORDER — SODIUM CHLORIDE 0.9 % IV SOLN: 500.0000 mL | INTRAVENOUS | Status: DC

## 2014-08-27 MED ORDER — RANITIDINE HCL 300 MG PO CAPS: 300.0000 mg | ORAL_CAPSULE | Freq: Every evening | ORAL | Status: DC

## 2014-08-27 MED ORDER — OMEPRAZOLE 20 MG PO CPDR: 20.0000 mg | DELAYED_RELEASE_CAPSULE | Freq: Two times a day (BID) | ORAL | Status: DC

## 2014-08-27 NOTE — Progress Notes (Signed)
Report to PACU, RN, vss, BBS= Clear.  

## 2014-08-27 NOTE — Progress Notes (Signed)
Called to room to assist during endoscopic procedure.  Patient ID and intended procedure confirmed with present staff. Received instructions for my participation in the procedure from the performing physician.  

## 2014-08-27 NOTE — Op Note (Addendum)
[facility]  [address] [place], [id number]   ENDOSCOPY PROCEDURE REPORT  PATIENT: Kelly Abbott, Kelly Abbott  MR#: [id number] BIRTHDATE: [birth year] , 40  yrs. old GENDER: female ENDOSCOPIST: Lafayette Dragon, MD REFERRED BY:  Everett Graff, M.D. PROCEDURE DATE:  08/27/2014 PROCEDURE:  EGD w/ biopsy ASA CLASS:     Class II INDICATIONS:  heartburn and prior endoscopy in 2006 showed hiatal hernia.  Area esophagram is normal.  Gastroesophageal reflux refractory to ranitidine 300 mg. MEDICATIONS: Monitored anesthesia care and Propofol 200 mg IV TOPICAL ANESTHETIC: none  DESCRIPTION OF PROCEDURE: After the risks benefits and alternatives of the procedure were thoroughly explained, informed consent was obtained.  The LB [id number] endoscope was introduced through the mouth and advanced to the second portion of the duodenum , Without limitations.  The instrument was slowly withdrawn as the mucosa was fully examined.      ESOPHAGUS: Abnormal mucosa was found at the gastroesophageal junction and in the distal esophagus  consistent with gradeA reflux esophagitis.  The mucosa had erosions x 5 mm in length,.  Multiple biopsies were performed using cold forceps.  Sample sent for histology.  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 gastric mucosa appeared normal. Retroflexion of the endoscope repeat normal fundus and cardia.  duodenum: duodenal bulb and descending duodenum was normal The scope was then withdrawn from the patient and the procedure completed.  COMPLICATIONS: There were no immediate complications.  ENDOSCOPIC IMPRESSION: 1. grade A esophagitis 2.small reducible hiatal hernia 3. Biopsies from GE junction  RECOMMENDATIONS: 1.  Anti-reflux regimen to be follow 2.  Await pathology results 3.  Omeprazole  20 mg po bid,#180, 3 refills 4.OV 6-8 weeks  REPEAT EXAM: for EGD pending biopsy results.  eSigned:  Lafayette Dragon, MD 08/27/2014 10:40 AM Revised: 08/27/2014 10:40 AM   CC:  PATIENT NAME:  [place], Flamm MR#: [id number]

## 2014-08-27 NOTE — Patient Instructions (Signed)
Impressions/recommendations:  Esophagitis (handout given) Hiatal hernia (handout given)  Office visit scheduled for August 24th at 1015. Please arrive at 1000 am.  Follow antireflux regimen prescribed.  YOU HAD AN ENDOSCOPIC PROCEDURE TODAY AT [facility]:   Refer to the procedure report that was given to you for any specific questions about what was found during the examination.  If the procedure report does not answer your questions, please call your gastroenterologist to clarify.  If you requested that your care partner not be given the details of your procedure findings, then the procedure report has been included in a sealed envelope for you to review at your convenience later.  YOU SHOULD EXPECT: Some feelings of bloating in the abdomen. Passage of more gas than usual.  Walking can help get rid of the air that was put into your GI tract during the procedure and reduce the bloating. If you had a lower endoscopy (such as a colonoscopy or flexible sigmoidoscopy) you may notice spotting of blood in your stool or on the toilet paper. If you underwent a bowel prep for your procedure, you may not have a normal bowel movement for a few days.  Please Note:  You might notice some irritation and congestion in your nose or some drainage.  This is from the oxygen used during your procedure.  There is no need for concern and it should clear up in a day or so.  SYMPTOMS TO REPORT IMMEDIATELY:    Following upper endoscopy (EGD)  Vomiting of blood or coffee ground material  New chest pain or pain under the shoulder blades  Painful or persistently difficult swallowing  New shortness of breath  Fever of 100F or higher  Black, tarry-looking stools  For urgent or emergent issues, a gastroenterologist can be reached at any hour by calling [phone].   DIET: Your first meal following the procedure should be a small meal and then it is ok to progress to your normal diet. Heavy or  fried foods are harder to digest and may make you feel nauseous or bloated.  Likewise, meals heavy in dairy and vegetables can increase bloating.  Drink plenty of fluids but you should avoid alcoholic beverages for 24 hours.  ACTIVITY:  You should plan to take it easy for the rest of today and you should NOT DRIVE or use heavy machinery until tomorrow (because of the sedation medicines used during the test).    FOLLOW UP: Our staff will call the number listed on your records the next business day following your procedure to check on you and address any questions or concerns that you may have regarding the information given to you following your procedure. If we do not reach you, we will leave a message.  However, if you are feeling well and you are not experiencing any problems, there is no need to return our call.  We will assume that you have returned to your regular daily activities without incident.  If any biopsies were taken you will be contacted by phone or by letter within the next 1-3 weeks.  Please call us at [phone] if you have not heard about the biopsies in 3 weeks.    SIGNATURES/CONFIDENTIALITY: You and/or your care partner have signed paperwork which will be entered into your electronic medical record.  These signatures attest to the fact that that the information above on your After Visit Summary has been reviewed and is understood.  Full responsibility of the confidentiality of  this discharge information lies with you and/or your care-partner.

## 2014-08-30 ENCOUNTER — Ambulatory Visit (INDEPENDENT_AMBULATORY_CARE_PROVIDER_SITE_OTHER)
Admission: RE | Admit: 2014-08-30 | Discharge: 2014-08-30 | Disposition: A | Discharge status: Home / Self Care | Payer: BC Managed Care – PPO | Source: Ambulatory Visit | Attending: Internal Medicine | Admitting: Internal Medicine

## 2014-08-30 ENCOUNTER — Other Ambulatory Visit (INDEPENDENT_AMBULATORY_CARE_PROVIDER_SITE_OTHER): Payer: BC Managed Care – PPO

## 2014-08-30 ENCOUNTER — Telehealth: Payer: Self-pay | Admitting: Internal Medicine

## 2014-08-30 ENCOUNTER — Telehealth: Payer: Self-pay | Admitting: *Deleted

## 2014-08-30 DIAGNOSIS — R109 Unspecified abdominal pain: Secondary | ICD-10-CM

## 2014-08-30 LAB — CBC WITH DIFFERENTIAL/PLATELET
BASOS PCT: 0.3 % (ref 0.0–3.0)
Basophils Absolute: 0 10*3/uL (ref 0.0–0.1)
EOS ABS: 0.2 10*3/uL (ref 0.0–0.7)
Eosinophils Relative: 1.5 % (ref 0.0–5.0)
HEMATOCRIT: 38.6 % (ref 36.0–46.0)
Hemoglobin: 12.4 g/dL (ref 12.0–15.0)
LYMPHS ABS: 2 10*3/uL (ref 0.7–4.0)
Lymphocytes Relative: 18.6 % (ref 12.0–46.0)
MCHC: 32.3 g/dL (ref 30.0–36.0)
MCV: 84.1 fl (ref 78.0–100.0)
MONO ABS: 0.7 10*3/uL (ref 0.1–1.0)
Monocytes Relative: 6.7 % (ref 3.0–12.0)
NEUTROS ABS: 7.8 10*3/uL — AB (ref 1.4–7.7)
Neutrophils Relative %: 72.9 % (ref 43.0–77.0)
Platelets: 423 10*3/uL — ABNORMAL HIGH (ref 150.0–400.0)
RBC: 4.59 Mil/uL (ref 3.87–5.11)
RDW: 15.9 % — ABNORMAL HIGH (ref 11.5–15.5)
WBC: 10.7 10*3/uL — AB (ref 4.0–10.5)

## 2014-08-30 NOTE — Telephone Encounter (Signed)
  Follow up Call-  Call back number 08/27/2014  Post procedure Call Back phone  # [phone]  Permission to leave phone message Yes     Patient questions:  Do you have a fever, pain , or abdominal swelling? No. Pain Score  0 *  Have you tolerated food without any problems? Yes.    Have you been able to return to your normal activities? Yes.    Do you have any questions about your discharge instructions: Diet   No. Medications  No. Follow up visit  No.  Do you have questions or concerns about your Care? No.  Actions: * If pain score is 4 or above: No action needed, pain <4.

## 2014-08-30 NOTE — Telephone Encounter (Signed)
Patient aware and she will come for xrays and lab today.

## 2014-08-30 NOTE — Telephone Encounter (Signed)
Doubt procedural complication, but given proximity to procedure would rec: 2v CXR and abd films, CBC May be constipation related pain, which we can recommend treatment for once I have reviewed labs and images

## 2014-08-30 NOTE — Telephone Encounter (Signed)
Patient had EGD on Friday. She states she is better with GERD but now has RLQ pain. States it started yesterday. States she is having severe abdominal pain and it hurts for her to walk. States she has not had a bowel movement in 3 days and is not passing gas. She has nausea but no vomiting. Denies fever. Dr. Olevia Perches is off. DOD- Dr. Hilarie Fredrickson. Please, advise.

## 2014-08-31 ENCOUNTER — Encounter: Payer: Self-pay | Admitting: Internal Medicine

## 2014-08-31 ENCOUNTER — Telehealth: Payer: Self-pay | Admitting: *Deleted

## 2014-08-31 NOTE — Telephone Encounter (Signed)
Spoke with patient and she is feeling much better after taking the Miralax and Gatorade. Scheduled f/u OV with Dr. Olevia Perches on 09/07/14.

## 2014-09-01 ENCOUNTER — Encounter: Payer: BC Managed Care – PPO | Admitting: Internal Medicine

## 2014-09-05 ENCOUNTER — Emergency Department (HOSPITAL_COMMUNITY): Payer: BC Managed Care – PPO

## 2014-09-05 ENCOUNTER — Encounter (HOSPITAL_COMMUNITY): Payer: Self-pay | Admitting: Emergency Medicine

## 2014-09-05 ENCOUNTER — Emergency Department (HOSPITAL_COMMUNITY)
Admission: EM | Admit: 2014-09-05 | Discharge: 2014-09-05 | Disposition: A | Discharge status: Home / Self Care | Payer: BC Managed Care – PPO | Attending: Emergency Medicine | Admitting: Emergency Medicine

## 2014-09-05 DIAGNOSIS — E559 Vitamin D deficiency, unspecified: Secondary | ICD-10-CM | POA: Insufficient documentation

## 2014-09-05 DIAGNOSIS — Z79899 Other long term (current) drug therapy: Secondary | ICD-10-CM | POA: Diagnosis not present

## 2014-09-05 DIAGNOSIS — K219 Gastro-esophageal reflux disease without esophagitis: Secondary | ICD-10-CM | POA: Diagnosis not present

## 2014-09-05 DIAGNOSIS — Z8659 Personal history of other mental and behavioral disorders: Secondary | ICD-10-CM | POA: Insufficient documentation

## 2014-09-05 DIAGNOSIS — N939 Abnormal uterine and vaginal bleeding, unspecified: Secondary | ICD-10-CM

## 2014-09-05 DIAGNOSIS — Z8619 Personal history of other infectious and parasitic diseases: Secondary | ICD-10-CM | POA: Diagnosis not present

## 2014-09-05 DIAGNOSIS — N938 Other specified abnormal uterine and vaginal bleeding: Secondary | ICD-10-CM | POA: Insufficient documentation

## 2014-09-05 DIAGNOSIS — R109 Unspecified abdominal pain: Secondary | ICD-10-CM

## 2014-09-05 DIAGNOSIS — R1030 Lower abdominal pain, unspecified: Secondary | ICD-10-CM | POA: Insufficient documentation

## 2014-09-05 DIAGNOSIS — Z793 Long term (current) use of hormonal contraceptives: Secondary | ICD-10-CM | POA: Diagnosis not present

## 2014-09-05 LAB — I-STAT CHEM 8, ED
BUN: 4 mg/dL — AB (ref 6–20)
CREATININE: 0.7 mg/dL (ref 0.44–1.00)
Calcium, Ion: 1.15 mmol/L (ref 1.12–1.23)
Chloride: 104 mmol/L (ref 101–111)
Glucose, Bld: 91 mg/dL (ref 65–99)
HCT: 43 % (ref 36.0–46.0)
Hemoglobin: 14.6 g/dL (ref 12.0–15.0)
Potassium: 3.8 mmol/L (ref 3.5–5.1)
Sodium: 141 mmol/L (ref 135–145)
TCO2: 21 mmol/L (ref 0–100)

## 2014-09-05 LAB — COMPREHENSIVE METABOLIC PANEL
ALBUMIN: 3.4 g/dL — AB (ref 3.5–5.0)
ALK PHOS: 66 U/L (ref 38–126)
ALT: 14 U/L (ref 14–54)
ANION GAP: 6 (ref 5–15)
AST: 19 U/L (ref 15–41)
BUN: <5 mg/dL — ABNORMAL LOW (ref 6–20)
CO2: 24 mmol/L (ref 22–32)
CREATININE: 0.78 mg/dL (ref 0.44–1.00)
Calcium: 8.7 mg/dL — ABNORMAL LOW (ref 8.9–10.3)
Chloride: 108 mmol/L (ref 101–111)
GFR calc non Af Amer: >60 mL/min (ref >60)
GLUCOSE: 94 mg/dL (ref 65–99)
POTASSIUM: 3.9 mmol/L (ref 3.5–5.1)
Sodium: 138 mmol/L (ref 135–145)
Total Bilirubin: 0.5 mg/dL (ref 0.3–1.2)
Total Protein: 6.7 g/dL (ref 6.5–8.1)

## 2014-09-05 LAB — CBC WITH DIFFERENTIAL/PLATELET
Basophils Absolute: 0 10*3/uL (ref 0.0–0.1)
Basophils Relative: 0 % (ref 0–1)
Eosinophils Absolute: 0.2 10*3/uL (ref 0.0–0.7)
Eosinophils Relative: 2 % (ref 0–5)
HEMATOCRIT: 38.4 % (ref 36.0–46.0)
Hemoglobin: 12.4 g/dL (ref 12.0–15.0)
LYMPHS ABS: 2.2 10*3/uL (ref 0.7–4.0)
Lymphocytes Relative: 24 % (ref 12–46)
MCH: 26.8 pg (ref 26.0–34.0)
MCHC: 32.3 g/dL (ref 30.0–36.0)
MCV: 82.9 fL (ref 78.0–100.0)
MONO ABS: 0.5 10*3/uL (ref 0.1–1.0)
Monocytes Relative: 5 % (ref 3–12)
Neutro Abs: 6.4 10*3/uL (ref 1.7–7.7)
Neutrophils Relative %: 69 % (ref 43–77)
Platelets: 416 10*3/uL — ABNORMAL HIGH (ref 150–400)
RBC: 4.63 MIL/uL (ref 3.87–5.11)
RDW: 15.8 % — ABNORMAL HIGH (ref 11.5–15.5)
WBC: 9.3 10*3/uL (ref 4.0–10.5)

## 2014-09-05 MED ORDER — IOHEXOL 300 MG/ML  SOLN
25.0000 mL | Freq: Once | INTRAMUSCULAR | Status: AC | PRN
  Administered 2014-09-05: 25 mL via ORAL

## 2014-09-05 MED ORDER — ONDANSETRON HCL 4 MG/2ML IJ SOLN
4.0000 mg | Freq: Once | INTRAMUSCULAR | Status: AC
  Administered 2014-09-05: 4 mg via INTRAVENOUS
  Filled 2014-09-05: qty 2

## 2014-09-05 MED ORDER — HYDROCODONE-ACETAMINOPHEN 5-325 MG PO TABS: 1.0000 | ORAL_TABLET | Freq: Four times a day (QID) | ORAL | Status: DC | PRN

## 2014-09-05 MED ORDER — IOHEXOL 300 MG/ML  SOLN
100.0000 mL | Freq: Once | INTRAMUSCULAR | Status: AC | PRN
  Administered 2014-09-05: 100 mL via INTRAVENOUS

## 2014-09-05 MED ORDER — FENTANYL CITRATE (PF) 100 MCG/2ML IJ SOLN
25.0000 ug | Freq: Once | INTRAMUSCULAR | Status: AC
  Administered 2014-09-05: 25 ug via INTRAVENOUS
  Filled 2014-09-05: qty 2

## 2014-09-05 MED ORDER — SODIUM CHLORIDE 0.9 % IV SOLN: INTRAVENOUS | Status: DC

## 2014-09-05 MED ORDER — ONDANSETRON 4 MG PO TBDP: 4.0000 mg | ORAL_TABLET | Freq: Three times a day (TID) | ORAL | Status: DC | PRN

## 2014-09-05 MED ORDER — SODIUM CHLORIDE 0.9 % IV BOLUS (SEPSIS)
500.0000 mL | Freq: Once | INTRAVENOUS | Status: AC
  Administered 2014-09-05: 500 mL via INTRAVENOUS

## 2014-09-05 NOTE — Discharge Instructions (Signed)
Workup here today without any significant findings. Continue the birth control pills which her OB/GYN doctor has U on. Make an appointment to follow-up with them. No signs of any significant anemia. Take the pain medicine as directed. He had antinausea medicines take with it. Return for any new or worse symptoms.

## 2014-09-05 NOTE — ED Notes (Signed)
Pt states she has been having right lower abd since June 26th. She is also passing large blood clots from her vagina. Pt states she is taking ibuprofen 800 mg three times a day for the abd pain.

## 2014-09-05 NOTE — ED Provider Notes (Signed)
CSN: [id number]     Arrival date & time 09/05/14  0933 History   First MD Initiated Contact with Patient 09/05/14 [phone]     Chief Complaint  Patient presents with  . Abdominal Pain     (Consider location/radiation/quality/duration/timing/severity/associated sxs/prior Treatment) Patient is a 59 y.o. female presenting with abdominal pain. The history is provided by the patient.  Abdominal Pain Associated symptoms: vaginal bleeding   Associated symptoms: no chest pain, no dysuria, no fever, no shortness of breath and no vaginal discharge    59 year old female with complaint of the bilateral lower abdominal pain since June 26 get worse in the last 24 hours. Patient is being followed by her OB/GYN doctor for dysfunctional vaginal bleeding since April. Started on birth control pills. Patient with restarting of bleeding heavily since June 26. [name] been taking Motrin for this. Patient had a uterine biopsy done the beginning of June. [name] OB/GYN doctor was talking about possible need for hysterectomy. Patient states that the lower abdominal pain is 10 out of 10. Associated nausea or vomiting.  Past Medical History  Diagnosis Date  . Vitamin D deficiency   . History of chicken pox   . H/O mumps   . H/O measles   . Panic attacks 2005  . Allergy   . GERD (gastroesophageal reflux disease)   . Wears partial dentures     upper partial  . Gastric ulcer   . Anxiety    Past Surgical History  Procedure Laterality Date  . Appendectomy    . Tubal ligation    . Bunionectomy Right 05/01/12    [place], right foot  . Remove implant deep Right 05/01/12/    Right foot  . Colonoscopy    . Cholecystectomy N/A 03/18/2013    Procedure: LAPAROSCOPIC CHOLECYSTECTOMY;  Surgeon: Harl Bowie, MD;  Location: [place];  Service: General;  Laterality: N/A;  . Endometrial biopsy  07/27/14  . Bunionectomy Left    Family History  Problem Relation Age of Onset  . Hypertension Mother    . Diabetes Mother   . Heart disease Sister   . Hypertension Sister   . Hypertension Brother   . Heart disease Maternal Aunt   . Cancer Maternal Grandmother     female cancer ? type  . Heart attack Brother     x 3  . Colon cancer Maternal Grandfather   . Prostate cancer Maternal Uncle   . Lung cancer Paternal Uncle   . Clotting disorder Brother    History  Substance Use Topics  . Smoking status: Never Smoker   . Smokeless tobacco: Never Used  . Alcohol Use: No   OB History    Gravida Para Term Preterm AB TAB SAB Ectopic Multiple Living   2 1 1       1      Review of Systems  Constitutional: Negative for fever.  HENT: Negative for congestion.   Eyes: Negative for visual disturbance.  Respiratory: Negative for shortness of breath.   Cardiovascular: Negative for chest pain.  Gastrointestinal: Positive for abdominal pain.  Genitourinary: Positive for vaginal bleeding. Negative for dysuria and vaginal discharge.  Musculoskeletal: Negative for back pain.  Skin: Negative for rash.  Neurological: Negative for headaches.  Hematological: Does not bruise/bleed easily.  Psychiatric/Behavioral: Negative for confusion.      Allergies  Codeine and Asa  Home Medications   Prior to Admission medications   Medication Sig Start Date End Date Taking? Authorizing Provider  Cholecalciferol (  VITAMIN D3) 50000 UNITS CAPS Take 1 capsule by mouth once a week. On Mondays   Yes Historical Provider, MD  Cyanocobalamin (VITAMIN B-12 PO) Take 1 tablet by mouth daily.   Yes Historical Provider, MD  ferrous sulfate 325 (65 FE) MG tablet Take 325 mg by mouth daily with breakfast.   Yes Historical Provider, MD  ibuprofen (ADVIL,MOTRIN) 200 MG tablet Take 800 mg by mouth every 6 (six) hours as needed for mild pain or moderate pain.   Yes Historical Provider, MD  norethindrone (AYGESTIN) 5 MG tablet Take 1 tablet by mouth daily. 08/05/14  Yes Historical Provider, MD  Omega-3 Fatty Acids (FISH OIL)  1000 MG CAPS Take 1,000 mg by mouth daily.   Yes Historical Provider, MD  omeprazole (PRILOSEC) 20 MG capsule Take 1 capsule (20 mg total) by mouth 2 (two) times daily before a meal. 08/27/14  Yes Lafayette Dragon, MD  polyethylene glycol (MIRALAX / Floria Raveling) packet Take 17 g by mouth daily as needed for mild constipation.   Yes Historical Provider, MD  ranitidine (ZANTAC) 300 MG capsule Take 1 capsule (300 mg total) by mouth every evening. 08/27/14  Yes Lafayette Dragon, MD  HYDROcodone-acetaminophen (NORCO/VICODIN) 5-325 MG per tablet Take 1-2 tablets by mouth every 6 (six) hours as needed for moderate pain. 09/05/14   Fredia Sorrow, MD  ondansetron (ZOFRAN ODT) 4 MG disintegrating tablet Take 1 tablet (4 mg total) by mouth every 8 (eight) hours as needed for nausea or vomiting. 09/05/14   Fredia Sorrow, MD   BP 112/55 mmHg  Pulse 72  Resp 15  Ht 5\' 4"  (1.626 m)  Wt 186 lb (84.369 kg)  BMI 31.91 kg/m2  SpO2 100%  LMP 06/24/2014 Physical Exam  Constitutional: She is oriented to person, place, and time. She appears well-developed and well-nourished. No distress.  HENT:  Head: Normocephalic and atraumatic.  Mouth/Throat: Oropharynx is clear and moist.  Eyes: Conjunctivae and EOM are normal. Pupils are equal, round, and reactive to light.  Neck: Normal range of motion.  Cardiovascular: Normal rate, regular rhythm and normal heart sounds.   No murmur heard. Pulmonary/Chest: Effort normal and breath sounds normal. No respiratory distress.  Abdominal: Soft. Bowel sounds are normal. There is no tenderness.  Musculoskeletal: Normal range of motion.  Neurological: She is alert and oriented to person, place, and time. No cranial nerve deficit. She exhibits normal muscle tone. Coordination normal.  Skin: Skin is warm. No rash noted.  Nursing note and vitals reviewed.   ED Course  Procedures (including critical care time) Labs Review Labs Reviewed  COMPREHENSIVE METABOLIC PANEL - Abnormal; Notable  for the following:    BUN <5 (*)    Calcium 8.7 (*)    Albumin 3.4 (*)    All other components within normal limits  CBC WITH DIFFERENTIAL/PLATELET - Abnormal; Notable for the following:    RDW 15.8 (*)    Platelets 416 (*)    All other components within normal limits  I-STAT CHEM 8, ED - Abnormal; Notable for the following:    BUN 4 (*)    All other components within normal limits   Results for orders placed or performed during the hospital encounter of 09/05/14  Comprehensive metabolic panel  Result Value Ref Range   Sodium 138 135 - 145 mmol/L   Potassium 3.9 3.5 - 5.1 mmol/L   Chloride 108 101 - 111 mmol/L   CO2 24 22 - 32 mmol/L   Glucose, Bld 94 65 -  99 mg/dL   BUN <5 (L) 6 - 20 mg/dL   Creatinine, Ser 0.78 0.44 - 1.00 mg/dL   Calcium 8.7 (L) 8.9 - 10.3 mg/dL   Total Protein 6.7 6.5 - 8.1 g/dL   Albumin 3.4 (L) 3.5 - 5.0 g/dL   AST 19 15 - 41 U/L   ALT 14 14 - 54 U/L   Alkaline Phosphatase 66 38 - 126 U/L   Total Bilirubin 0.5 0.3 - 1.2 mg/dL   GFR calc non Af Amer >60 >60 mL/min   GFR calc Af Amer >60 >60 mL/min   Anion gap 6 5 - 15  CBC with Differential/Platelet  Result Value Ref Range   WBC 9.3 4.0 - 10.5 K/uL   RBC 4.63 3.87 - 5.11 MIL/uL   Hemoglobin 12.4 12.0 - 15.0 g/dL   HCT 38.4 36.0 - 46.0 %   MCV 82.9 78.0 - 100.0 fL   MCH 26.8 26.0 - 34.0 pg   MCHC 32.3 30.0 - 36.0 g/dL   RDW 15.8 (H) 11.5 - 15.5 %   Platelets 416 (H) 150 - 400 K/uL   Neutrophils Relative % 69 43 - 77 %   Neutro Abs 6.4 1.7 - 7.7 K/uL   Lymphocytes Relative 24 12 - 46 %   Lymphs Abs 2.2 0.7 - 4.0 K/uL   Monocytes Relative 5 3 - 12 %   Monocytes Absolute 0.5 0.1 - 1.0 K/uL   Eosinophils Relative 2 0 - 5 %   Eosinophils Absolute 0.2 0.0 - 0.7 K/uL   Basophils Relative 0 0 - 1 %   Basophils Absolute 0.0 0.0 - 0.1 K/uL  I-Stat Chem 8, ED  Result Value Ref Range   Sodium 141 135 - 145 mmol/L   Potassium 3.8 3.5 - 5.1 mmol/L   Chloride 104 101 - 111 mmol/L   BUN 4 (L) 6 - 20  mg/dL   Creatinine, Ser 0.70 0.44 - 1.00 mg/dL   Glucose, Bld 91 65 - 99 mg/dL   Calcium, Ion 1.15 1.12 - 1.23 mmol/L   TCO2 21 0 - 100 mmol/L   Hemoglobin 14.6 12.0 - 15.0 g/dL   HCT 43.0 36.0 - 46.0 %    Imaging Review Ct Abdomen Pelvis W Contrast  09/05/2014   CLINICAL DATA:  RIGHT lower abdominal pain since 08/29/2014, hematuria, passing large blood clots with urination, past history of gastric ulcer, appendectomy, GERD, cholecystectomy, tubal ligation, endometrial biopsy  EXAM: CT ABDOMEN AND PELVIS WITH CONTRAST  TECHNIQUE: Multidetector CT imaging of the abdomen and pelvis was performed using the standard protocol following bolus administration of intravenous contrast. Sagittal and coronal MPR images reconstructed from axial data set.  CONTRAST:  119mL OMNIPAQUE IOHEXOL 300 MG/ML SOLN IV. Dilute oral contrast.  COMPARISON:  03/27/2013  FINDINGS: Lung bases clear.  Small hepatic and LEFT renal cysts.  Small amount excreted contrast material within the renal collecting systems and ureters bilaterally associated with a LEFT ureteral jet at the urinary bladder.  Liver, spleen, pancreas, kidneys, and adrenal glands otherwise normal.  Appendix and gallbladder surgically absent.  Normal appearing uterus, adnexa, bladder and ureters.  Stomach and bowel loops normal appearance.  No mass, adenopathy, free fluid, free air or inflammatory process.  Mild degenerative disc disease changes L4-L5 and L5-S1.  IMPRESSION: No acute intra-abdominal or intrapelvic abnormalities.  Small hepatic and LEFT renal cysts.   Electronically Signed   By: Lavonia Dana M.D.   On: 09/05/2014 12:28     EKG Interpretation  None      MDM   Final diagnoses:  Abdominal pain  Vaginal bleeding    [name] workup for the lower abdominal pain without any acute abdominal or pelvic findings. [name] had the problems with vaginal bleeding since April. Patient is on birth control pills provided by her OB/GYN doctor. She had  uterine biopsies as well. That had some discussions about perhaps the need for hysterectomy. Patient showing no evidence of any significant blood loss from the vaginal bleeding at this point in time. [name] pain controlled with pain medicine here. CT scan without any acute findings. No significant electrolyte abnormalities no leukocytosis no liver function test abnormailities.     Fredia Sorrow, MD 09/05/14 1329

## 2014-09-07 ENCOUNTER — Encounter (HOSPITAL_COMMUNITY): Payer: Self-pay | Admitting: Certified Registered Nurse Anesthetist

## 2014-09-07 ENCOUNTER — Ambulatory Visit: Payer: BC Managed Care – PPO | Admitting: Internal Medicine

## 2014-09-07 ENCOUNTER — Other Ambulatory Visit: Payer: Self-pay | Admitting: Obstetrics and Gynecology

## 2014-09-07 ENCOUNTER — Encounter (HOSPITAL_COMMUNITY)
Admission: AD | Disposition: A | Discharge status: Home / Self Care | Payer: Self-pay | Source: Ambulatory Visit | Attending: Obstetrics and Gynecology

## 2014-09-07 ENCOUNTER — Ambulatory Visit (HOSPITAL_COMMUNITY): Payer: BC Managed Care – PPO | Admitting: Anesthesiology

## 2014-09-07 ENCOUNTER — Ambulatory Visit (HOSPITAL_COMMUNITY)
Admission: AD | Admit: 2014-09-07 | Discharge: 2014-09-07 | Disposition: A | Discharge status: Home / Self Care | Payer: BC Managed Care – PPO | Source: Ambulatory Visit | Attending: Obstetrics and Gynecology | Admitting: Obstetrics and Gynecology

## 2014-09-07 DIAGNOSIS — Z885 Allergy status to narcotic agent status: Secondary | ICD-10-CM | POA: Diagnosis not present

## 2014-09-07 DIAGNOSIS — Z6832 Body mass index (BMI) 32.0-32.9, adult: Secondary | ICD-10-CM | POA: Insufficient documentation

## 2014-09-07 DIAGNOSIS — R0683 Snoring: Secondary | ICD-10-CM | POA: Diagnosis not present

## 2014-09-07 DIAGNOSIS — K279 Peptic ulcer, site unspecified, unspecified as acute or chronic, without hemorrhage or perforation: Secondary | ICD-10-CM | POA: Diagnosis not present

## 2014-09-07 DIAGNOSIS — N840 Polyp of corpus uteri: Secondary | ICD-10-CM | POA: Diagnosis not present

## 2014-09-07 DIAGNOSIS — K219 Gastro-esophageal reflux disease without esophagitis: Secondary | ICD-10-CM | POA: Insufficient documentation

## 2014-09-07 DIAGNOSIS — F410 Panic disorder [episodic paroxysmal anxiety] without agoraphobia: Secondary | ICD-10-CM | POA: Diagnosis not present

## 2014-09-07 DIAGNOSIS — K449 Diaphragmatic hernia without obstruction or gangrene: Secondary | ICD-10-CM | POA: Insufficient documentation

## 2014-09-07 DIAGNOSIS — N950 Postmenopausal bleeding: Secondary | ICD-10-CM | POA: Insufficient documentation

## 2014-09-07 DIAGNOSIS — E559 Vitamin D deficiency, unspecified: Secondary | ICD-10-CM | POA: Diagnosis not present

## 2014-09-07 DIAGNOSIS — Z8049 Family history of malignant neoplasm of other genital organs: Secondary | ICD-10-CM | POA: Insufficient documentation

## 2014-09-07 HISTORY — DX: Headache: R51

## 2014-09-07 HISTORY — DX: Headache, unspecified: R51.9

## 2014-09-07 HISTORY — PX: DILATATION & CURRETTAGE/HYSTEROSCOPY WITH RESECTOCOPE: SHX5572

## 2014-09-07 HISTORY — DX: Zoster without complications: B02.9

## 2014-09-07 SURGERY — DILATATION & CURETTAGE/HYSTEROSCOPY WITH RESECTOCOPE
Anesthesia: General | Site: Vagina

## 2014-09-07 MED ORDER — LACTATED RINGERS IV SOLN
INTRAVENOUS | Status: DC | PRN
Start: 2014-09-07 — End: 2014-09-07
  Administered 2014-09-07: 15:00:00 via INTRAVENOUS

## 2014-09-07 MED ORDER — LIDOCAINE HCL (CARDIAC) 20 MG/ML IV SOLN
INTRAVENOUS | Status: AC
  Filled 2014-09-07: qty 5

## 2014-09-07 MED ORDER — NEOSTIGMINE METHYLSULFATE 10 MG/10ML IV SOLN
INTRAVENOUS | Status: DC | PRN
  Administered 2014-09-07: 4 mg via INTRAVENOUS

## 2014-09-07 MED ORDER — KETOROLAC TROMETHAMINE 30 MG/ML IJ SOLN
INTRAMUSCULAR | Status: AC
  Filled 2014-09-07: qty 1

## 2014-09-07 MED ORDER — SCOPOLAMINE 1 MG/3DAYS TD PT72
MEDICATED_PATCH | TRANSDERMAL | Status: AC
  Administered 2014-09-07: 1.5 mg via TRANSDERMAL
  Filled 2014-09-07: qty 1

## 2014-09-07 MED ORDER — MIDAZOLAM HCL 2 MG/2ML IJ SOLN
INTRAMUSCULAR | Status: AC
  Filled 2014-09-07: qty 2

## 2014-09-07 MED ORDER — LIDOCAINE HCL 1 % IJ SOLN
INTRAMUSCULAR | Status: AC
  Filled 2014-09-07: qty 20

## 2014-09-07 MED ORDER — DEXAMETHASONE SODIUM PHOSPHATE 4 MG/ML IJ SOLN
INTRAMUSCULAR | Status: AC
  Filled 2014-09-07: qty 1

## 2014-09-07 MED ORDER — FENTANYL CITRATE (PF) 100 MCG/2ML IJ SOLN: 25.0000 ug | INTRAMUSCULAR | Status: DC | PRN

## 2014-09-07 MED ORDER — PROPOFOL 10 MG/ML IV BOLUS
INTRAVENOUS | Status: DC | PRN
  Administered 2014-09-07: 170 mg via INTRAVENOUS
  Administered 2014-09-07: 30 mg via INTRAVENOUS

## 2014-09-07 MED ORDER — LIDOCAINE HCL 1 % IJ SOLN
INTRAMUSCULAR | Status: DC | PRN
  Administered 2014-09-07: 10 mL

## 2014-09-07 MED ORDER — ONDANSETRON HCL 4 MG/2ML IJ SOLN
INTRAMUSCULAR | Status: AC
  Filled 2014-09-07: qty 2

## 2014-09-07 MED ORDER — MEPERIDINE HCL 25 MG/ML IJ SOLN: 6.2500 mg | INTRAMUSCULAR | Status: DC | PRN

## 2014-09-07 MED ORDER — MIDAZOLAM HCL 2 MG/2ML IJ SOLN
INTRAMUSCULAR | Status: DC | PRN
  Administered 2014-09-07: 2 mg via INTRAVENOUS

## 2014-09-07 MED ORDER — GLYCOPYRROLATE 0.2 MG/ML IJ SOLN
INTRAMUSCULAR | Status: AC
  Filled 2014-09-07: qty 3

## 2014-09-07 MED ORDER — IBUPROFEN 600 MG PO TABS: 600.0000 mg | ORAL_TABLET | Freq: Four times a day (QID) | ORAL | Status: DC | PRN

## 2014-09-07 MED ORDER — ROCURONIUM BROMIDE 100 MG/10ML IV SOLN
INTRAVENOUS | Status: AC
  Filled 2014-09-07: qty 1

## 2014-09-07 MED ORDER — LIDOCAINE HCL (CARDIAC) 20 MG/ML IV SOLN
INTRAVENOUS | Status: DC | PRN
  Administered 2014-09-07: 80 mg via INTRAVENOUS

## 2014-09-07 MED ORDER — LABETALOL HCL 5 MG/ML IV SOLN
INTRAVENOUS | Status: DC | PRN
  Administered 2014-09-07 (×2): 2.5 mg via INTRAVENOUS

## 2014-09-07 MED ORDER — PROPOFOL 10 MG/ML IV BOLUS
INTRAVENOUS | Status: AC
  Filled 2014-09-07: qty 20

## 2014-09-07 MED ORDER — GLYCOPYRROLATE 0.2 MG/ML IJ SOLN
INTRAMUSCULAR | Status: DC | PRN
  Administered 2014-09-07: 0.6 mg via INTRAVENOUS

## 2014-09-07 MED ORDER — FENTANYL CITRATE (PF) 100 MCG/2ML IJ SOLN
INTRAMUSCULAR | Status: DC | PRN
  Administered 2014-09-07: 100 ug via INTRAVENOUS

## 2014-09-07 MED ORDER — ROCURONIUM BROMIDE 100 MG/10ML IV SOLN
INTRAVENOUS | Status: DC | PRN
  Administered 2014-09-07: 30 mg via INTRAVENOUS

## 2014-09-07 MED ORDER — DEXAMETHASONE SODIUM PHOSPHATE 10 MG/ML IJ SOLN
INTRAMUSCULAR | Status: DC | PRN
  Administered 2014-09-07: 4 mg via INTRAVENOUS

## 2014-09-07 MED ORDER — FENTANYL CITRATE (PF) 100 MCG/2ML IJ SOLN
INTRAMUSCULAR | Status: AC
  Filled 2014-09-07: qty 2

## 2014-09-07 MED ORDER — PROMETHAZINE HCL 25 MG/ML IJ SOLN: 6.2500 mg | INTRAMUSCULAR | Status: DC | PRN

## 2014-09-07 MED ORDER — KETOROLAC TROMETHAMINE 30 MG/ML IJ SOLN
INTRAMUSCULAR | Status: DC | PRN
  Administered 2014-09-07: 30 mg via INTRAVENOUS

## 2014-09-07 MED ORDER — SCOPOLAMINE 1 MG/3DAYS TD PT72
1.0000 | MEDICATED_PATCH | Freq: Once | TRANSDERMAL | Status: DC
  Administered 2014-09-07: 1.5 mg via TRANSDERMAL

## 2014-09-07 MED ORDER — SUCCINYLCHOLINE CHLORIDE 20 MG/ML IJ SOLN
INTRAMUSCULAR | Status: DC | PRN
  Administered 2014-09-07: 140 mg via INTRAVENOUS

## 2014-09-07 MED ORDER — NEOSTIGMINE METHYLSULFATE 10 MG/10ML IV SOLN
INTRAVENOUS | Status: AC
  Filled 2014-09-07: qty 1

## 2014-09-07 MED ORDER — LACTATED RINGERS IV SOLN
INTRAVENOUS | Status: DC
  Administered 2014-09-07: 15:00:00 via INTRAVENOUS

## 2014-09-07 MED ORDER — ONDANSETRON HCL 4 MG/2ML IJ SOLN
INTRAMUSCULAR | Status: DC | PRN
  Administered 2014-09-07: 4 mg via INTRAVENOUS

## 2014-09-07 MED ORDER — HYDROCODONE-ACETAMINOPHEN 5-325 MG PO TABS: 1.0000 | ORAL_TABLET | Freq: Four times a day (QID) | ORAL | Status: DC | PRN

## 2014-09-07 MED ORDER — LABETALOL HCL 5 MG/ML IV SOLN
INTRAVENOUS | Status: AC
  Filled 2014-09-07: qty 4

## 2014-09-07 MED ORDER — GLYCINE 1.5 % IR SOLN
Status: DC | PRN
  Administered 2014-09-07: 3000 mL

## 2014-09-07 MED ORDER — SUCCINYLCHOLINE CHLORIDE 20 MG/ML IJ SOLN
INTRAMUSCULAR | Status: AC
  Filled 2014-09-07: qty 1

## 2014-09-07 SURGICAL SUPPLY — 17 items
CANISTER SUCT 3000ML (MISCELLANEOUS) ×3 IMPLANT
CATH ROBINSON RED A/P 16FR (CATHETERS) ×3 IMPLANT
CLOTH BEACON ORANGE TIMEOUT ST (SAFETY) ×3 IMPLANT
CONTAINER PREFILL 10% NBF 60ML (FORM) ×6 IMPLANT
ELECT REM PT RETURN 9FT ADLT (ELECTROSURGICAL) ×3
ELECTRODE REM PT RTRN 9FT ADLT (ELECTROSURGICAL) ×1 IMPLANT
GLOVE BIO SURGEON STRL SZ7.5 (GLOVE) ×3 IMPLANT
GLOVE BIOGEL PI IND STRL 7.5 (GLOVE) ×1 IMPLANT
GLOVE BIOGEL PI INDICATOR 7.5 (GLOVE) ×2
GOWN STRL REUS W/TWL LRG LVL3 (GOWN DISPOSABLE) ×6 IMPLANT
LOOP ANGLED CUTTING 22FR (CUTTING LOOP) ×3 IMPLANT
PACK VAGINAL MINOR WOMEN LF (CUSTOM PROCEDURE TRAY) ×3 IMPLANT
PAD OB MATERNITY 4.3X12.25 (PERSONAL CARE ITEMS) ×3 IMPLANT
TOWEL OR 17X24 6PK STRL BLUE (TOWEL DISPOSABLE) ×6 IMPLANT
TUBING AQUILEX INFLOW (TUBING) ×3 IMPLANT
TUBING AQUILEX OUTFLOW (TUBING) ×3 IMPLANT
WATER STERILE IRR 1000ML POUR (IV SOLUTION) ×3 IMPLANT

## 2014-09-07 NOTE — Anesthesia Procedure Notes (Signed)
Procedure Name: Intubation Date/Time: 09/07/2014 3:06 PM Performed by: Raenette Rover Pre-anesthesia Checklist: Patient identified, Emergency Drugs available, Suction available and Patient being monitored Patient Re-evaluated:Patient Re-evaluated prior to inductionOxygen Delivery Method: Circle system utilized Preoxygenation: Pre-oxygenation with 100% oxygen Intubation Type: IV induction Ventilation: Mask ventilation without difficulty and Oral airway inserted - appropriate to patient size Laryngoscope Size: Mac and 3 Grade View: Grade II Tube type: Oral Tube size: 7.0 mm Number of attempts: 1 Airway Equipment and Method: Stylet Placement Confirmation: breath sounds checked- equal and bilateral,  ETT inserted through vocal cords under direct vision,  positive ETCO2 and CO2 detector Secured at: 20 cm Tube secured with: Tape Dental Injury: Teeth and Oropharynx as per pre-operative assessment

## 2014-09-07 NOTE — Anesthesia Preprocedure Evaluation (Addendum)
Anesthesia Evaluation  Patient identified by MRN, date of birth, ID band Patient awake    Reviewed: Allergy & Precautions, H&P , NPO status , [name] Chart, lab work & pertinent test results  Airway Mallampati: III  TM Distance: >3 FB Neck ROM: Full    Dental  (+) Partial Upper, Dental Advisory Given   Pulmonary  Snores- ? Undiagnosed OSA breath sounds clear to auscultation  Pulmonary exam normal       Cardiovascular negative cardio ROS Normal cardiovascular examRhythm:Regular     Neuro/Psych  Headaches,    GI/Hepatic Neg liver ROS, hiatal hernia, PUD, GERD-  Medicated and Controlled,  Endo/Other  Morbid obesity  Renal/GU negative Renal ROS  negative genitourinary   Musculoskeletal negative musculoskeletal ROS (+)   Abdominal (+) + obese,  Abdomen: soft.    Peds  Hematology negative hematology ROS (+)   Anesthesia Other Findings   Reproductive/Obstetrics PMB                            Anesthesia Physical Anesthesia Plan  ASA: II  Anesthesia Plan: General   Post-op Pain Management:    Induction: Intravenous  Airway Management Planned: LMA and Oral ETT  Additional Equipment:   Intra-op Plan:   Post-operative Plan:   Informed Consent: I have reviewed the patients History and Physical, chart, labs and discussed the procedure including the risks, benefits and alternatives for the proposed anesthesia with the patient or authorized representative who has indicated his/her understanding and acceptance.   Dental advisory given  Plan Discussed with: Anesthesiologist, CRNA and Surgeon  Anesthesia Plan Comments:       Anesthesia Quick Evaluation

## 2014-09-07 NOTE — Discharge Instructions (Signed)
DISCHARGE INSTRUCTIONS: HYSTEROSCOPY  The following instructions have been prepared to help you care for yourself upon your return home.  May Remove Scop patch on or before Thursday 09/09/14  May take Ibuprofen after 9:30pm today.  May take stool softner while taking narcotic pain medication to prevent constipation.  Drink plenty of water.  Personal hygiene:  Use sanitary pads for vaginal drainage, not tampons.  Shower the day after your procedure.  NO tub baths, pools or Jacuzzis for 2-3 weeks.  Wipe front to back after using the bathroom.  Activity and limitations:  Do NOT drive or operate any equipment for 24 hours. The effects of anesthesia are still present and drowsiness may result.  Do NOT rest in bed all day.  Walking is encouraged.  Walk up and down stairs slowly.  You may resume your normal activity in one to two days or as indicated by your physician. Sexual activity: NO intercourse for at least 2 weeks after the procedure, or as indicated by your Doctor.  Diet: Eat a light meal as desired this evening. You may resume your usual diet tomorrow.  Return to Work: You may resume your work activities in one to two days or as indicated by Marine scientist.  What to expect after your surgery: Expect to have vaginal bleeding/discharge for 2-3 days and spotting for up to 10 days. It is not unusual to have soreness for up to 1-2 weeks. You may have a slight burning sensation when you urinate for the first day. Mild cramps may continue for a couple of days. You may have a regular period in 2-6 weeks.  Call your doctor for any of the following:  Excessive vaginal bleeding or clotting, saturating and changing one pad every hour.  Inability to urinate 6 hours after discharge from hospital.  Pain not relieved by pain medication.  Fever of 100.4 F or greater.  Unusual vaginal discharge or odor.  Return to office _________________Call for an appointment  ___________________ Patients signature: ______________________ Nurses signature ________________________  [facility] [phone]

## 2014-09-07 NOTE — Anesthesia Postprocedure Evaluation (Signed)
  Anesthesia Post-op Note  Patient: Kelly Abbott  Procedure(s) Performed: Procedure(s): DILATATION & CURETTAGE/HYSTEROSCOPY WITH RESECTOCOPE (N/A)  Patient Location: PACU  Anesthesia Type:General  Level of Consciousness: awake, alert  and oriented  Airway and Oxygen Therapy: Patient Spontanous Breathing  Post-op Pain: none  Post-op Assessment: Post-op Vital signs reviewed, [name] Cardiovascular Status Stable, Respiratory Function Stable, Patent Airway, No signs of Nausea or vomiting and Pain level controlled              Post-op Vital Signs: Reviewed and stable  Last Vitals:  Filed Vitals:   09/07/14 1700  BP: 127/69  Pulse: 65  Temp:   Resp: 16    Complications: No apparent anesthesia complications

## 2014-09-07 NOTE — H&P (Signed)
Kelly Abbott is an 59 y.o. female. She is well known to me and presents today c/o severe lower abdominal pain and heavy vaginal bleeding.  She had negative EmBx in May and u/s that only showed 73mm EE.  Pt has been taking norethindrone acetate and EE today on u/s was 26mm.  I discussed options which included hyst/D&C today and she would like to proceed.  Pt reports a h/o of no menses for greater than 12 consecutive months.    [name] last menstrual period was 08/30/2014 (exact date).    Past Medical History  Diagnosis Date  . Vitamin D deficiency   . History of chicken pox   . H/O mumps   . H/O measles   . Panic attacks 2005  . Allergy   . GERD (gastroesophageal reflux disease)   . Wears partial dentures     upper partial  . Gastric ulcer   . Anxiety   . Headache     history of migraines  . Shingles     Past Surgical History  Procedure Laterality Date  . Appendectomy    . Tubal ligation    . Bunionectomy Right 05/01/12    [place], right foot  . Remove implant deep Right 05/01/12/    Right foot  . Colonoscopy    . Cholecystectomy N/A 03/18/2013    Procedure: LAPAROSCOPIC CHOLECYSTECTOMY;  Surgeon: Harl Bowie, MD;  Location: [place];  Service: General;  Laterality: N/A;  . Endometrial biopsy  07/27/14  . Bunionectomy Left   . Upper gi endoscopy      Family History  Problem Relation Age of Onset  . Hypertension Mother   . Diabetes Mother   . Heart disease Sister   . Hypertension Sister   . Hypertension Brother   . Heart disease Maternal Aunt   . Cancer Maternal Grandmother     female cancer ? type  . Heart attack Brother     x 3  . Colon cancer Maternal Grandfather   . Prostate cancer Maternal Uncle   . Lung cancer Paternal Uncle   . Clotting disorder Brother     Social History:  reports that she has never smoked. She has never used smokeless tobacco. She reports that she does not drink alcohol or use illicit  drugs.  Allergies:  Allergies  Allergen Reactions  . Codeine Nausea Only  . Asa [Aspirin] Hives    Prescriptions prior to admission  Medication Sig Dispense Refill Last Dose  . HYDROcodone-acetaminophen (NORCO/VICODIN) 5-325 MG per tablet Take 1-2 tablets by mouth every 6 (six) hours as needed for moderate pain. 20 tablet 0 09/07/2014 at 0230  . norethindrone (AYGESTIN) 5 MG tablet Take 1 tablet by mouth daily.   09/06/2014 at Unknown time  . omeprazole (PRILOSEC) 20 MG capsule Take 1 capsule (20 mg total) by mouth 2 (two) times daily before a meal. 180 capsule 3 09/06/2014 at Unknown time  . ondansetron (ZOFRAN ODT) 4 MG disintegrating tablet Take 1 tablet (4 mg total) by mouth every 8 (eight) hours as needed for nausea or vomiting. 10 tablet 1 09/06/2014 at Unknown time  . ranitidine (ZANTAC) 300 MG capsule Take 1 capsule (300 mg total) by mouth every evening. 30 capsule 6 09/06/2014 at Unknown time  . Cholecalciferol (VITAMIN D3) 50000 UNITS CAPS Take 1 capsule by mouth once a week. On Mondays   08/30/2014  . Cyanocobalamin (VITAMIN B-12 PO) Take 1 tablet by mouth daily.   08/31/2014  . ferrous  sulfate 325 (65 FE) MG tablet Take 325 mg by mouth daily with breakfast.   08/31/2014  . ibuprofen (ADVIL,MOTRIN) 200 MG tablet Take 800 mg by mouth every 6 (six) hours as needed for mild pain or moderate pain.   09/03/2014  . Omega-3 Fatty Acids (FISH OIL) 1000 MG CAPS Take 1,000 mg by mouth daily.   08/31/2014  . polyethylene glycol (MIRALAX / GLYCOLAX) packet Take 17 g by mouth daily as needed for mild constipation.   09/04/2014    ROS  Blood pressure 132/70, pulse 73, temperature 98.4 F (36.9 C), temperature source Oral, resp. rate 20, last menstrual period 08/30/2014, SpO2 97 %. Physical Exam  No results found for this or any previous visit (from the past 24 hour(s)).  No results found.  Assessment/Plan: PMB with thickening EE for hysteroscopy/D&C.  R/B/A discussed and because of the unexpected  nature will also consent pt for hysterectomy in the event bleeding can not be controlled.    Florine Sprenkle Y 09/07/2014, 2:41 PM

## 2014-09-07 NOTE — Op Note (Signed)
Preop Diagnosis: Post Menopausal Bleeding   Postop Diagnosis: Post Menopausal Bleeding   Procedure: DILATATION & CURETTAGE/HYSTEROSCOPY  Anesthesia: General   Anesthesiologist: Josephine Igo, MD   Attending: Everett Graff, MD   Assistant: N/a  Findings: Fluffy endometrium  Pathology: 1.Endometrial [place]rett[place] with large clot 2.Endometrial [place]rettings with more tissue than clot  Fluids: 800 cc  UOP: 100 cc  EBL: 10 cc  Complications: None  Procedure:The patient was taken to the operating room after the risks, benefits and alternatives were discussed with the patient. The patient verbalized understanding and consent signed and witnessed. The patient was placed under general anesthesia with an LMA per anesthesiologist and prepped and draped in the normal sterile fashion.  Time Out was performed per protocol.  A bivalve speculum was placed in the patient's vagina and the anterior lip of the cervix was grasped with a single tooth tenaculum. A paracervical block was administered using a total of 10 cc of 1% lidocaine. The uterus sounded to 11 cm. The cervix was dilated for passage of the hysteroscope.  The hysteroscope was introduced into the uterine cavity and findings as noted above. Sharp curettage was performed until a gritty texture was noted and curettings were sent to pathology. The hysteroscope was reintroduced and no obvious remaining intracavitary lesions were noted.  All instruments were removed. Sponge lap and needle count was correct. The patient tolerated the procedure well and was returned to the recovery room in good condition.

## 2014-09-07 NOTE — Transfer of Care (Signed)
Immediate Anesthesia Transfer of Care Note  Patient: Kelly Abbott  Procedure(s) Performed: Procedure(s): DILATATION & CURETTAGE/HYSTEROSCOPY WITH RESECTOCOPE (N/A)  Patient Location: PACU  Anesthesia Type:General  Level of Consciousness: awake, alert , oriented and patient cooperative  Airway & Oxygen Therapy: Patient Spontanous Breathing and Patient connected to nasal cannula oxygen  Post-op Assessment: Report given to RN and Post -op Vital signs reviewed and stable  Post vital signs: Reviewed and stable  Last Vitals:  Filed Vitals:   09/07/14 1335  BP: 132/70  Pulse: 73  Temp: 36.9 C  Resp: 20    Complications: No apparent anesthesia complications

## 2014-09-08 ENCOUNTER — Encounter (HOSPITAL_COMMUNITY): Payer: Self-pay | Admitting: Obstetrics and Gynecology

## 2014-09-21 LAB — CBC AND DIFFERENTIAL
HCT: 35 % — AB (ref 36–46)
Hemoglobin: 11 g/dL — AB (ref 12.0–16.0)
PLATELETS: 476 10*3/uL — AB (ref 150–399)
WBC: 9.2 10*3/mL

## 2014-10-21 ENCOUNTER — Ambulatory Visit (INDEPENDENT_AMBULATORY_CARE_PROVIDER_SITE_OTHER): Payer: BC Managed Care – PPO | Admitting: Family

## 2014-10-21 ENCOUNTER — Encounter: Payer: Self-pay | Admitting: Family

## 2014-10-21 VITALS — BP 122/78 | HR 77 | Temp 97.9°F | Resp 18 | Ht 63.75 in | Wt 211.0 lb

## 2014-10-21 DIAGNOSIS — R1031 Right lower quadrant pain: Secondary | ICD-10-CM | POA: Diagnosis not present

## 2014-10-21 NOTE — Patient Instructions (Addendum)
Thank you for choosing [facility].  Summary/Instructions:  Your prescription(s) have been submitted to your pharmacy or been printed and provided for you. Please take as directed and contact our office if you believe you are having problem(s) with the medication(s) or have any questions.  If your symptoms worsen or fail to improve, please contact our office for further instruction, or in case of emergency go directly to the emergency room at the closest medical facility.     

## 2014-10-21 NOTE — Progress Notes (Signed)
Subjective:    Patient ID: Kelly Abbott, female    DOB: [birth year], 59 y.o.   MRN: [id number]  Chief Complaint  Patient presents with  . Establish Care    since surgery she has had a shooting pain in her lower right quadrant, wants to know if it was her ovaries that had anything to do with it, wants to know if hysterectomy will help    HPI:  Kelly Abbott is a 59 y.o. female with a PMH of Vitamin D deficiency, GERD, panic attacks and obesity who presents today for an office visit to establish care.   1.) Abdominal pain - Associated symptom of pain located in the right lower quadrant of her abdomen has been going on for about 1.5 months with the onset after her dilation and curettage/hysteroscopy with restectoscope. Describes the pain as sharp lasting for about 10 minutes at a time. Severity of the pain of about 9-10/10 and then decreases . Modifying factors include ibuprofen which does help with the pain. Frequency of the pain about 1-2 times per week. Notes it more when she wears tight clothing. Has appointment with Dr. Mancel Bale in about 1 week. Denies fevers or chills.   Allergies  Allergen Reactions  . Codeine Nausea Only  . Asa [Aspirin] Hives    Outpatient Prescriptions Prior to Visit  Medication Sig Dispense Refill  . Cholecalciferol (VITAMIN D3) 50000 UNITS CAPS Take 1 capsule by mouth once a week. On Mondays    . ibuprofen (ADVIL,MOTRIN) 600 MG tablet Take 1 tablet (600 mg total) by mouth every 6 (six) hours as needed. 30 tablet 0  . norethindrone (AYGESTIN) 5 MG tablet Take 1 tablet by mouth daily.    . Omega-3 Fatty Acids (FISH OIL) 1000 MG CAPS Take 1,000 mg by mouth daily.    Marland Kitchen omeprazole (PRILOSEC) 20 MG capsule Take 1 capsule (20 mg total) by mouth 2 (two) times daily before a meal. 180 capsule 3  . polyethylene glycol (MIRALAX / GLYCOLAX) packet Take 17 g by mouth daily as needed for mild constipation.    . ranitidine (ZANTAC) 300 MG capsule Take 1  capsule (300 mg total) by mouth every evening. 30 capsule 6  . Cyanocobalamin (VITAMIN B-12 PO) Take 1 tablet by mouth daily.    . ferrous sulfate 325 (65 FE) MG tablet Take 325 mg by mouth daily with breakfast.    . HYDROcodone-acetaminophen (NORCO/VICODIN) 5-325 MG per tablet Take 1 tablet by mouth every 6 (six) hours as needed for moderate pain. 30 tablet 0  . ondansetron (ZOFRAN ODT) 4 MG disintegrating tablet Take 1 tablet (4 mg total) by mouth every 8 (eight) hours as needed for nausea or vomiting. 10 tablet 1   No facility-administered medications prior to visit.     Past Medical History  Diagnosis Date  . Vitamin D deficiency   . History of chicken pox   . H/O mumps   . H/O measles   . Panic attacks 2005  . Allergy   . GERD (gastroesophageal reflux disease)   . Wears partial dentures     upper partial  . Gastric ulcer   . Anxiety   . Headache     history of migraines  . Shingles   . Migraines      Past Surgical History  Procedure Laterality Date  . Appendectomy    . Tubal ligation    . Bunionectomy Right 05/01/12    [place], right foot  . Remove implant deep Right  05/01/12/    Right foot  . Colonoscopy    . Cholecystectomy N/A 03/18/2013    Procedure: LAPAROSCOPIC CHOLECYSTECTOMY;  Surgeon: Harl Bowie, MD;  Location: [facility];  Service: General;  Laterality: N/A;  . Endometrial biopsy  07/27/14  . Bunionectomy Left   . Upper gi endoscopy    . Dilatation & currettage/hysteroscopy with resectocope N/A 09/07/2014    Procedure: DILATATION & CURETTAGE/HYSTEROSCOPY WITH RESECTOCOPE;  Surgeon: Everett Graff, MD;  Location: [facility];  Service: Gynecology;  Laterality: N/A;     Family History  Problem Relation Age of Onset  . Hypertension Mother   . Diabetes Mother   . Heart disease Sister   . Hypertension Sister   . Hypertension Brother   . Heart disease Maternal Aunt   . Cancer Maternal Grandmother     female cancer ? type  . Heart attack  Brother     x 3  . Colon cancer Maternal Grandfather   . Prostate cancer Maternal Uncle   . Lung cancer Paternal Uncle   . Clotting disorder Brother      Social History   Social History  . Marital Status: Married    Spouse Name: N/A  . Number of Children: 1  . Years of Education: 16   Occupational History  . Agricultural engineer     [employer]   Social History Main Topics  . Smoking status: Never Smoker   . Smokeless tobacco: Never Used  . Alcohol Use: No  . Drug Use: No  . Sexual Activity: Yes    Birth Control/ Protection: None   Other Topics Concern  . Not on file   Social History Narrative   Fun: Bowling, shopping, workout   Denies religious beliefs effecting healthcare   Denies abuse and feels safe at home.      Review of Systems  Constitutional: Negative for fever and chills.  Gastrointestinal: Positive for abdominal pain. Negative for nausea, vomiting, diarrhea, constipation and abdominal distention.      Objective:    BP 122/78 mmHg  Pulse 77  Temp(Src) 97.9 F (36.6 C) (Oral)  Resp 18  Ht 5' 3.75" (1.619 m)  Wt 211 lb (95.709 kg)  BMI 36.51 kg/m2  SpO2 96%  LMP 08/30/2014 (Exact Date) Nursing note and vital signs reviewed.  Physical Exam  Constitutional: She is oriented to person, place, and time. She appears well-developed and well-nourished. No distress.  Cardiovascular: Normal rate, regular rhythm, normal heart sounds and intact distal pulses.   Pulmonary/Chest: Effort normal and breath sounds normal.  Abdominal: Soft. Normal appearance and bowel sounds are normal. She exhibits no mass. There is no hepatosplenomegaly. There is tenderness in the right lower quadrant. There is no rigidity, no rebound, no guarding and negative Murphy's sign.  Neurological: She is alert and oriented to person, place, and time.  Skin: Skin is warm and dry.  Psychiatric: She has a normal mood and affect. Her behavior is normal. Judgment and thought  content normal.       Assessment & Plan:   Problem List Items Addressed This Visit      Other   RLQ abdominal pain - Primary    Right lower quadrant pain that waxes and wanes most likely GYN related as CT of the abdomen was negative for abdominal pathology and symptoms began follow previous GYN surgery. Continue to treat conservatively with OTC ibuprofen as needed and follow up with GYN as scheduled.

## 2014-10-21 NOTE — Progress Notes (Signed)
Pre visit review using our clinic review tool, if applicable. No additional management support is needed unless otherwise documented below in the visit note. 

## 2014-10-21 NOTE — Assessment & Plan Note (Signed)
Right lower quadrant pain that waxes and wanes most likely GYN related as CT of the abdomen was negative for abdominal pathology and symptoms began follow previous GYN surgery. Continue to treat conservatively with OTC ibuprofen as needed and follow up with GYN as scheduled.

## 2014-10-22 ENCOUNTER — Ambulatory Visit: Payer: BC Managed Care – PPO | Admitting: Internal Medicine

## 2014-10-27 ENCOUNTER — Ambulatory Visit: Payer: BC Managed Care – PPO | Admitting: Internal Medicine

## 2014-10-29 LAB — CBC AND DIFFERENTIAL
HCT: 38 % (ref 36–46)
Hemoglobin: 12.3 g/dL (ref 12.0–16.0)
Platelets: 422 K/µL — AB (ref 150–399)
WBC: 9.4 10*3/mL

## 2014-11-17 ENCOUNTER — Ambulatory Visit (INDEPENDENT_AMBULATORY_CARE_PROVIDER_SITE_OTHER): Payer: BC Managed Care – PPO | Admitting: Family

## 2014-11-17 ENCOUNTER — Encounter: Payer: Self-pay | Admitting: Family

## 2014-11-17 ENCOUNTER — Other Ambulatory Visit: Payer: Self-pay | Admitting: Obstetrics and Gynecology

## 2014-11-17 VITALS — BP 114/70 | HR 75 | Temp 98.7°F | Resp 18 | Ht 63.75 in | Wt 212.4 lb

## 2014-11-17 DIAGNOSIS — R1031 Right lower quadrant pain: Secondary | ICD-10-CM | POA: Diagnosis not present

## 2014-11-17 DIAGNOSIS — D473 Essential (hemorrhagic) thrombocythemia: Secondary | ICD-10-CM | POA: Diagnosis not present

## 2014-11-17 DIAGNOSIS — N6489 Other specified disorders of breast: Secondary | ICD-10-CM

## 2014-11-17 DIAGNOSIS — R7989 Other specified abnormal findings of blood chemistry: Secondary | ICD-10-CM | POA: Insufficient documentation

## 2014-11-17 NOTE — Progress Notes (Signed)
Subjective:    Patient ID: Kelly Abbott, female    DOB: 03-06-1955, 59 y.o.   MRN: [id number]  Chief Complaint  Patient presents with  . Abdominal Pain    got lab results from GYN doc and she state that her platelets continue to stay elevated, having sharp pains in lower right quadrant, does not have an appendix, the pain has been going on since her D&C    HPI:  Kelly Abbott is a 59 y.o. female with a PMH of seasonal allergies, anxiety, GERD, Vitamin D deficiency, shingles, migraines and gastric ulcer who presents today for an office follow up.    1.) Elevated platelets - Recently had blood work evaluation with her gynecologist and was found to have elevated platelet levels of 401, 472 and 416. She was advised to follow up with primary care. Denies any cardiac or respiratory symptoms.   2.) Right abdominal pain - Previously seen in the office for right lower quadrant abdominal pain that started following a DNC by gynecology and has since followed up. She is scheduled to undergo additional testing in about 1 month, however continues to experience pain located in her RLQ that is described as sharp and lasing for about 10 minutes at a time and occurs 1-2 times per week.    Allergies  Allergen Reactions  . Codeine Nausea Only  . Asa [Aspirin] Hives    Current Outpatient Prescriptions on File Prior to Visit  Medication Sig Dispense Refill  . Cholecalciferol (VITAMIN D3) 50000 UNITS CAPS Take 1 capsule by mouth once a week. On Mondays    . ibuprofen (ADVIL,MOTRIN) 600 MG tablet Take 1 tablet (600 mg total) by mouth every 6 (six) hours as needed. 30 tablet 0  . norethindrone (AYGESTIN) 5 MG tablet Take 1 tablet by mouth daily.    . Omega-3 Fatty Acids (FISH OIL) 1000 MG CAPS Take 1,000 mg by mouth daily.    Marland Kitchen omeprazole (PRILOSEC) 20 MG capsule Take 1 capsule (20 mg total) by mouth 2 (two) times daily before a meal. 180 capsule 3  . polyethylene glycol (MIRALAX /  GLYCOLAX) packet Take 17 g by mouth daily as needed for mild constipation.    . ranitidine (ZANTAC) 300 MG capsule Take 1 capsule (300 mg total) by mouth every evening. 30 capsule 6   No current facility-administered medications on file prior to visit.    Review of Systems  Constitutional: Negative for fever and chills.  Respiratory: Negative for chest tightness and shortness of breath.   Cardiovascular: Negative for chest pain, palpitations and leg swelling.  Gastrointestinal: Positive for abdominal pain.  Hematological: Does not bruise/bleed easily.      Objective:    BP 114/70 mmHg  Pulse 75  Temp(Src) 98.7 F (37.1 C) (Oral)  Resp 18  Ht 5' 3.75" (1.619 m)  Wt 212 lb 6.4 oz (96.344 kg)  BMI 36.76 kg/m2  SpO2 97%  LMP 08/30/2014 (Exact Date) Nursing note and vital signs reviewed.  Physical Exam  Constitutional: She is oriented to person, place, and time. She appears well-developed and well-nourished. No distress.  Cardiovascular: Normal rate, regular rhythm, normal heart sounds and intact distal pulses.   Pulmonary/Chest: Effort normal and breath sounds normal.  Abdominal: Soft. Bowel sounds are normal. She exhibits no mass. There is no rigidity, no rebound, no guarding, no tenderness at McBurney's point and negative Murphy's sign.    Neurological: She is alert and oriented to person, place, and time.  Skin: Skin is  warm and dry.  Psychiatric: She has a normal mood and affect. Her behavior is normal. Judgment and thought content normal.       Assessment & Plan:   Problem List Items Addressed This Visit      Hematopoietic and Hemostatic   Elevated platelet count    One platelet value above thrombocytosis threshold of 450,000 which was most likely related to her surgery. Follow up readings have averaged below 450,000. Continue to monitor at this time and follow up CBC in 3 months. If continues to experience elevated platelets will potentially refer to hematology.          Other   RLQ abdominal pain - Primary    Continues to experience RLQ pain most likely consistent with adenexal pain. Follow up with GYN for further management and assessment as GI exam is benign.

## 2014-11-17 NOTE — Progress Notes (Signed)
Pre visit review using our clinic review tool, if applicable. No additional management support is needed unless otherwise documented below in the visit note. 

## 2014-11-17 NOTE — Assessment & Plan Note (Signed)
One platelet value above thrombocytosis threshold of 450,000 which was most likely related to her surgery. Follow up readings have averaged below 450,000. Continue to monitor at this time and follow up CBC in 3 months. If continues to experience elevated platelets will potentially refer to hematology.

## 2014-11-17 NOTE — Patient Instructions (Signed)
Thank you for choosing [facility].  Summary/Instructions:  Continue to take your medications as prescribed.   Follow up in 2 months for a recheck of your platelets.   Follow up with gynecology for further evaluation.  If your symptoms worsen or fail to improve, please contact our office for further instruction, or in case of emergency go directly to the emergency room at the closest medical facility.

## 2014-11-17 NOTE — Assessment & Plan Note (Signed)
Continues to experience RLQ pain most likely consistent with adenexal pain. Follow up with GYN for further management and assessment as GI exam is benign.

## 2014-11-23 ENCOUNTER — Ambulatory Visit
Admission: RE | Admit: 2014-11-23 | Discharge: 2014-11-23 | Disposition: A | Discharge status: Home / Self Care | Payer: BC Managed Care – PPO | Source: Ambulatory Visit | Attending: Obstetrics and Gynecology | Admitting: Obstetrics and Gynecology

## 2014-11-23 DIAGNOSIS — N6489 Other specified disorders of breast: Secondary | ICD-10-CM

## 2014-11-26 ENCOUNTER — Encounter: Payer: Self-pay | Admitting: Family

## 2014-12-03 ENCOUNTER — Ambulatory Visit (INDEPENDENT_AMBULATORY_CARE_PROVIDER_SITE_OTHER): Payer: BC Managed Care – PPO

## 2014-12-03 ENCOUNTER — Ambulatory Visit (INDEPENDENT_AMBULATORY_CARE_PROVIDER_SITE_OTHER): Payer: BC Managed Care – PPO | Admitting: Physician Assistant

## 2014-12-03 VITALS — BP 138/74 | HR 78 | Temp 98.0°F | Resp 18 | Ht 65.0 in | Wt 214.0 lb

## 2014-12-03 DIAGNOSIS — M79601 Pain in right arm: Secondary | ICD-10-CM | POA: Diagnosis not present

## 2014-12-03 MED ORDER — NAPROXEN 500 MG PO TABS: 500.0000 mg | ORAL_TABLET | Freq: Two times a day (BID) | ORAL | Status: DC

## 2014-12-03 MED ORDER — CYCLOBENZAPRINE HCL 5 MG PO TABS: 5.0000 mg | ORAL_TABLET | Freq: Three times a day (TID) | ORAL | Status: DC | PRN

## 2014-12-03 MED ORDER — HYDROCODONE-ACETAMINOPHEN 5-325 MG PO TABS: 1.0000 | ORAL_TABLET | Freq: Four times a day (QID) | ORAL | Status: DC | PRN

## 2014-12-03 NOTE — Patient Instructions (Signed)
Wear sling for 2 days and then stop - if your symptoms change please RTC

## 2014-12-03 NOTE — Progress Notes (Signed)
Kelly Abbott  MRN: [id number] DOB: [birth year]  Subjective:  Pt presents to clinic with right arm pain that started about 2 weeks ago but last night it got much worse.  She did not have an injury that she knows of.  She is having trouble sleeping because of the pain.  She cannot lay on that side because of the pain. She has tried motrin 600mg  at night but it is not helping her pain.  She feels like the pain is a spasm and when it gets bad it radiates into her neck and wrist and hand and into chest wall.  She is having no SOB or deep chest pain and no sweating.  She does not really have pain in her elbow or shoulder joints.  She is right handed.  Patient Active Problem List   Diagnosis Date Noted  . Elevated platelet count 11/17/2014  . RLQ abdominal pain 10/21/2014  . BMI 35.0-35.9,adult 09/02/2012  . Status post foot joint surgery 06/24/2012  . 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 Vitamin D deficiency   . Panic attacks     Current Outpatient Prescriptions on File Prior to Visit  Medication Sig Dispense Refill  . ibuprofen (ADVIL,MOTRIN) 600 MG tablet Take 1 tablet (600 mg total) by mouth every 6 (six) hours as needed. 30 tablet 0  . Omega-3 Fatty Acids (FISH OIL) 1000 MG CAPS Take 1,000 mg by mouth daily.    Marland Kitchen omeprazole (PRILOSEC) 20 MG capsule Take 1 capsule (20 mg total) by mouth 2 (two) times daily before a meal. 180 capsule 3  . polyethylene glycol (MIRALAX / GLYCOLAX) packet Take 17 g by mouth daily as needed for mild constipation.    . ranitidine (ZANTAC) 300 MG capsule Take 1 capsule (300 mg total) by mouth every evening. 30 capsule 6   No current facility-administered medications on file prior to visit.    Allergies  Allergen Reactions  . Codeine Nausea Only  . Asa [Aspirin] Hives    Review of Systems  Musculoskeletal: Negative for joint swelling.   Objective:  BP 138/74 mmHg  Pulse 78  Temp(Src) 98 F (36.7 C) (Oral)  Resp 18  Ht 5\' 5"  (1.651 m)  Wt  214 lb (97.07 kg)  BMI 35.61 kg/m2  SpO2 98%  LMP 08/30/2014 (Exact Date)  Physical Exam  Constitutional: She is oriented to person, place, and time and well-developed, well-nourished, and in no distress.  HENT:  Head: Normocephalic and atraumatic.  Right Ear: Hearing and external ear normal.  Left Ear: Hearing and external ear normal.  Eyes: Conjunctivae are normal.  Neck: Normal range of motion.  Pulmonary/Chest: Effort normal.  Musculoskeletal:       Right shoulder: She exhibits tenderness (mild) and pain. She exhibits normal range of motion, no swelling and no spasm.       Right elbow: Normal.She exhibits normal range of motion. No tenderness found.       Cervical back: She exhibits spasm.       Back:       Right upper arm: She exhibits tenderness and bony tenderness (unable to tell - she has TTP all over her arm but it does seem worse over her deltoid). She exhibits no edema, no deformity and no laceration.  Pt in room splinting her arm and shoulder.  Good hand grip strength.  She has decrease shoulder ROM 2nd to pain in her upper arm - she has the most pain with abduction vs flexion and extension of the  right shoulder.   Neurological: She is alert and oriented to person, place, and time. Gait normal.  Skin: Skin is warm and dry.  Psychiatric: Mood, memory, affect and judgment normal.  Vitals reviewed.  [facility] reading (PRIMARY) by  Dr. Lorelei Pont.  Neg  Assessment and Plan :  Arm pain, right - Plan: DG Humerus Right   Her joints are fine - I suspect that her shoulder pain is more related to the use of the arm muscles.  We will sling her so she stops splinting herself which I think is causing some trapezius muscle spasms.  She will use the muscle spasms and NSAIDs daily.  She will use Norco only for intense pain mainly at night.  She will RTC in 48-72h if she is worsening.  Once she is placed in the sling she has some pain relief.  Windell Hummingbird PA-C  [facility] 12/03/2014 6:30 PM

## 2014-12-24 ENCOUNTER — Encounter: Payer: Self-pay | Admitting: Family

## 2014-12-24 ENCOUNTER — Ambulatory Visit (INDEPENDENT_AMBULATORY_CARE_PROVIDER_SITE_OTHER): Payer: BC Managed Care – PPO | Admitting: Family

## 2014-12-24 DIAGNOSIS — M25511 Pain in right shoulder: Secondary | ICD-10-CM | POA: Diagnosis not present

## 2014-12-24 MED ORDER — NAPROXEN 500 MG PO TABS: 500.0000 mg | ORAL_TABLET | Freq: Two times a day (BID) | ORAL | Status: DC

## 2014-12-24 NOTE — Progress Notes (Signed)
Pre visit review using our clinic review tool, if applicable. No additional management support is needed unless otherwise documented below in the visit note. 

## 2014-12-24 NOTE — Progress Notes (Signed)
Subjective:    Patient ID: Kelly Abbott, female    DOB: Aug 09, 1955, 59 y.o.   MRN: [id number]  Chief Complaint  Patient presents with  . Arm Pain    x3 weeks,having extreme pain in her right arm, last night the pain was in her arm and went to her shoulder and face, the pain gets extremely bad at night    HPI:  Kelly Abbott is a 59 y.o. female who  has a past medical history of Vitamin D deficiency; History of chicken pox; H/O mumps; H/O measles; Panic attacks (2005); Allergy; GERD (gastroesophageal reflux disease); Wears partial dentures; Gastric ulcer; Anxiety; Headache; Shingles; and Migraines. and presents todayfor an acute office visit.  Associated symptom of pain located in her right arm has been going on for about 3 weeks. Timing of the pain is worse at night. Described as an ache travels from her shoulder down to her fingers. Severity of the pain is around a 6-7/10. Describes some mild neck pain. Denies chest pain. Denies trauma to the area or sounds/sensations heard or felt. [facility] and recently diagnosed with arm pain. X-rays reviewed showed possible calcific tendonitis or bursitis. She was treated with Flexeril, Naproxen and Vicodin. Reports the medications have helped a little. When she wakes up it continues to hurt.   Allergies  Allergen Reactions  . Codeine Nausea Only  . Asa [Aspirin] Hives     Current Outpatient Prescriptions on File Prior to Visit  Medication Sig Dispense Refill  . co-enzyme Q-10 30 MG capsule Take 30 mg by mouth 3 (three) times daily.    . cyclobenzaprine (FLEXERIL) 5 MG tablet Take 1 tablet (5 mg total) by mouth 3 (three) times daily as needed for muscle spasms. 30 tablet 0  . HYDROcodone-acetaminophen (NORCO/VICODIN) 5-325 MG tablet Take 1 tablet by mouth every 6 (six) hours as needed for moderate pain. 20 tablet 0  . Omega-3 Fatty Acids (FISH OIL) 1000 MG CAPS Take 1,000 mg by mouth daily.    Marland Kitchen omeprazole  (PRILOSEC) 20 MG capsule Take 1 capsule (20 mg total) by mouth 2 (two) times daily before a meal. 180 capsule 3  . polyethylene glycol (MIRALAX / GLYCOLAX) packet Take 17 g by mouth daily as needed for mild constipation.    . ranitidine (ZANTAC) 300 MG capsule Take 1 capsule (300 mg total) by mouth every evening. 30 capsule 6   No current facility-administered medications on file prior to visit.     Past Surgical History  Procedure Laterality Date  . Appendectomy    . Tubal ligation    . Bunionectomy Right 05/01/12    [place], right foot  . Remove implant deep Right 05/01/12/    Right foot  . Colonoscopy    . Cholecystectomy N/A 03/18/2013    Procedure: LAPAROSCOPIC CHOLECYSTECTOMY;  Surgeon: Harl Bowie, MD;  Location: [facility];  Service: General;  Laterality: N/A;  . Endometrial biopsy  07/27/14  . Bunionectomy Left   . Upper gi endoscopy    . Dilatation & currettage/hysteroscopy with resectocope N/A 09/07/2014    Procedure: DILATATION & CURETTAGE/HYSTEROSCOPY WITH RESECTOCOPE;  Surgeon: Everett Graff, MD;  Location: [facility];  Service: Gynecology;  Laterality: N/A;    Past Medical History  Diagnosis Date  . Vitamin D deficiency   . History of chicken pox   . H/O mumps   . H/O measles   . Panic attacks 2005  . Allergy   . GERD (gastroesophageal  reflux disease)   . Wears partial dentures     upper partial  . Gastric ulcer   . Anxiety   . Headache     history of migraines  . Shingles   . Migraines      Review of Systems  Musculoskeletal:       Positive for right shoulder pain  Neurological: Negative for weakness and numbness.      Objective:    BP 110/80 mmHg  Pulse 79  Temp(Src) 98 F (36.7 C) (Oral)  Resp 18  Ht 5' 3.75" (1.619 m)  Wt 213 lb 6.4 oz (96.798 kg)  BMI 36.93 kg/m2  SpO2 93%  LMP 08/30/2014 (Exact Date) Nursing note and vital signs reviewed.  Physical Exam  Constitutional: She is oriented to person, place, and time. She  appears well-developed and well-nourished. No distress.  Cardiovascular: Normal rate, regular rhythm, normal heart sounds and intact distal pulses.   Pulmonary/Chest: Effort normal and breath sounds normal.  Musculoskeletal:  Right shoulder - no obvious deformity, discoloration, or edema. Tenderness elicited over her subacromial space, and deltoid. Active range of motion is limited to less than 90 in flexion and abduction. Passive range of motion is limited in flexion and abduction as well secondary to pain. Negative apprehension, positive Neer's impingement. Distal pulses and sensation are intact and appropriate.  Neurological: She is alert and oriented to person, place, and time.  Skin: Skin is warm and dry.  Psychiatric: She has a normal mood and affect. Her behavior is normal. Judgment and thought content normal.       Assessment & Plan:   Problem List Items Addressed This Visit      Other   Right shoulder pain    Symptoms and exam consistent with calcific tendinitis or subacromial bursitis. Recommend sling 48 hours. Ice 2-4 times per day or as needed and home exercise therapy. Refill naproxen. Refer to orthopedics for possible joint injection or further evaluation.      Relevant Medications   naproxen (NAPROSYN) 500 MG tablet   Other Relevant Orders   Ambulatory referral to Orthopedic Surgery

## 2014-12-24 NOTE — Patient Instructions (Signed)
Thank you for choosing [facility].  Summary/Instructions:  Your prescription(s) have been submitted to your pharmacy or been printed and provided for you. Please take as directed and contact our office if you believe you are having problem(s) with the medication(s) or have any questions.  Referrals have been made during this visit. You should expect to hear back from our schedulers in about 7-10 days in regards to establishing an appointment with the specialists we discussed.   If your symptoms worsen or fail to improve, please contact our office for further instruction, or in case of emergency go directly to the emergency room at the closest medical facility.   Sling as needed - recommend for the next couple of days.  Ice 2-4 times per day and as needed for about 20 minutes.  Continue anti-inflammatories.  Impingement Syndrome, Rotator Cuff, Bursitis With Rehab Impingement syndrome is a condition that involves inflammation of the tendons of the rotator cuff and the subacromial bursa, that causes pain in the shoulder. The rotator cuff consists of four tendons and muscles that control much of the shoulder and upper arm function. The subacromial bursa is a fluid filled sac that helps reduce friction between the rotator cuff and one of the bones of the shoulder (acromion). Impingement syndrome is usually an overuse injury that causes swelling of the bursa (bursitis), swelling of the tendon (tendonitis), and/or a tear of the tendon (strain). Strains are classified into three categories. Grade 1 strains cause pain, but the tendon is not lengthened. Grade 2 strains include a lengthened ligament, due to the ligament being stretched or partially ruptured. With grade 2 strains there is still function, although the function may be decreased. Grade 3 strains include a complete tear of the tendon or muscle, and function is usually impaired. SYMPTOMS   Pain around the shoulder, often at the outer  portion of the upper arm.  Pain that gets worse with shoulder function, especially when reaching overhead or lifting.  Sometimes, aching when not using the arm.  Pain that wakes you up at night.  Sometimes, tenderness, swelling, warmth, or redness over the affected area.  Loss of strength.  Limited motion of the shoulder, especially reaching behind the back (to the back pocket or to unhook bra) or across your body.  Crackling sound (crepitation) when moving the arm.  Biceps tendon pain and inflammation (in the front of the shoulder). Worse when bending the elbow or lifting. CAUSES  Impingement syndrome is often an overuse injury, in which chronic (repetitive) motions cause the tendons or bursa to become inflamed. A strain occurs when a force is paced on the tendon or muscle that is greater than it can withstand. Common mechanisms of injury include: Stress from sudden increase in duration, frequency, or intensity of training.  Direct hit (trauma) to the shoulder.  Aging, erosion of the tendon with normal use.  Bony bump on shoulder (acromial spur). RISK INCREASES WITH:  Contact sports (football, wrestling, boxing).  Throwing sports (baseball, tennis, volleyball).  Weightlifting and bodybuilding.  Heavy labor.  Previous injury to the rotator cuff, including impingement.  Poor shoulder strength and flexibility.  Failure to warm up properly before activity.  Inadequate protective equipment.  Old age.  Bony bump on shoulder (acromial spur). PREVENTION   Warm up and stretch properly before activity.  Allow for adequate recovery between workouts.  Maintain physical fitness:  Strength, flexibility, and endurance.  Cardiovascular fitness.  Learn and use proper exercise technique. PROGNOSIS  If treated properly,  impingement syndrome usually goes away within 6 weeks. Sometimes surgery is required.  RELATED COMPLICATIONS   Longer healing time if not properly  treated, or if not given enough time to heal.  Recurring symptoms, that result in a chronic condition.  Shoulder stiffness, frozen shoulder, or loss of motion.  Rotator cuff tendon tear.  Recurring symptoms, especially if activity is resumed too soon, with overuse, with a direct blow, or when using poor technique. TREATMENT  Treatment first involves the use of ice and medicine, to reduce pain and inflammation. The use of strengthening and stretching exercises may help reduce pain with activity. These exercises may be performed at home or with a therapist. If non-surgical treatment is unsuccessful after more than 6 months, surgery may be advised. After surgery and rehabilitation, activity is usually possible in 3 months.  MEDICATION  If pain medicine is needed, nonsteroidal anti-inflammatory medicines (aspirin and ibuprofen), or other minor pain relievers (acetaminophen), are often advised.  Do not take pain medicine for 7 days before surgery.  Prescription pain relievers may be given, if your caregiver thinks they are needed. Use only as directed and only as much as you need.  Corticosteroid injections may be given by your caregiver. These injections should be reserved for the most serious cases, because they may only be given a certain number of times. HEAT AND COLD  Cold treatment (icing) should be applied for 10 to 15 minutes every 2 to 3 hours for inflammation and pain, and immediately after activity that aggravates your symptoms. Use ice packs or an ice massage.  Heat treatment may be used before performing stretching and strengthening activities prescribed by your caregiver, physical therapist, or athletic trainer. Use a heat pack or a warm water soak. SEEK MEDICAL CARE IF:   Symptoms get worse or do not improve in 4 to 6 weeks, despite treatment.  New, unexplained symptoms develop. (Drugs used in treatment may produce side effects.) EXERCISES  RANGE OF MOTION (ROM) AND  STRETCHING EXERCISES - Impingement Syndrome (Rotator Cuff  Tendinitis, Bursitis) These exercises may help you when beginning to rehabilitate your injury. Your symptoms may go away with or without further involvement from your physician, physical therapist or athletic trainer. While completing these exercises, remember:   Restoring tissue flexibility helps normal motion to return to the joints. This allows healthier, less painful movement and activity.  An effective stretch should be held for at least 30 seconds.  A stretch should never be painful. You should only feel a gentle lengthening or release in the stretched tissue. STRETCH - Flexion, Standing  Stand with good posture. With an underhand grip on your right / left hand, and an overhand grip on the opposite hand, grasp a broomstick or cane so that your hands are a little more than shoulder width apart.  Keeping your right / left elbow straight and shoulder muscles relaxed, push the stick with your opposite hand, to raise your right / left arm in front of your body and then overhead. Raise your arm until you feel a stretch in your right / left shoulder, but before you have increased shoulder pain.  Try to avoid shrugging your right / left shoulder as your arm rises, by keeping your shoulder blade tucked down and toward your mid-back spine. Hold for __________ seconds.  Slowly return to the starting position. Repeat __________ times. Complete this exercise __________ times per day. STRETCH - Abduction, Supine  Lie on your back. With an underhand grip on your right /  left hand and an overhand grip on the opposite hand, grasp a broomstick or cane so that your hands are a little more than shoulder width apart.  Keeping your right / left elbow straight and your shoulder muscles relaxed, push the stick with your opposite hand, to raise your right / left arm out to the side of your body and then overhead. Raise your arm until you feel a stretch  in your right / left shoulder, but before you have increased shoulder pain.  Try to avoid shrugging your right / left shoulder as your arm rises, by keeping your shoulder blade tucked down and toward your mid-back spine. Hold for __________ seconds.  Slowly return to the starting position. Repeat __________ times. Complete this exercise __________ times per day. ROM - Flexion, Active-Assisted  Lie on your back. You may bend your knees for comfort.  Grasp a broomstick or cane so your hands are about shoulder width apart. Your right / left hand should grip the end of the stick, so that your hand is positioned "thumbs-up," as if you were about to shake hands.  Using your healthy arm to lead, raise your right / left arm overhead, until you feel a gentle stretch in your shoulder. Hold for __________ seconds.  Use the stick to assist in returning your right / left arm to its starting position. Repeat __________ times. Complete this exercise __________ times per day.  ROM - Internal Rotation, Supine   Lie on your back on a firm surface. Place your right / left elbow about 60 degrees away from your side. Elevate your elbow with a folded towel, so that the elbow and shoulder are the same height.  Using a broomstick or cane and your strong arm, pull your right / left hand toward your body until you feel a gentle stretch, but no increase in your shoulder pain. Keep your shoulder and elbow in place throughout the exercise.  Hold for __________ seconds. Slowly return to the starting position. Repeat __________ times. Complete this exercise __________ times per day. STRETCH - Internal Rotation  Place your right / left hand behind your back, palm up.  Throw a towel or belt over your opposite shoulder. Grasp the towel with your right / left hand.  While keeping an upright posture, gently pull up on the towel, until you feel a stretch in the front of your right / left shoulder.  Avoid shrugging your  right / left shoulder as your arm rises, by keeping your shoulder blade tucked down and toward your mid-back spine.  Hold for __________ seconds. Release the stretch, by lowering your healthy hand. Repeat __________ times. Complete this exercise __________ times per day. ROM - Internal Rotation   Using an underhand grip, grasp a stick behind your back with both hands.  While standing upright with good posture, slide the stick up your back until you feel a mild stretch in the front of your shoulder.  Hold for __________ seconds. Slowly return to your starting position. Repeat __________ times. Complete this exercise __________ times per day.  STRETCH - Posterior Shoulder Capsule   Stand or sit with good posture. Grasp your right / left elbow and draw it across your chest, keeping it at the same height as your shoulder.  Pull your elbow, so your upper arm comes in closer to your chest. Pull until you feel a gentle stretch in the back of your shoulder.  Hold for __________ seconds. Repeat __________ times. Complete this exercise  __________ times per day. STRENGTHENING EXERCISES - Impingement Syndrome (Rotator Cuff Tendinitis, Bursitis) These exercises may help you when beginning to rehabilitate your injury. They may resolve your symptoms with or without further involvement from your physician, physical therapist or athletic trainer. While completing these exercises, remember:  Muscles can gain both the endurance and the strength needed for everyday activities through controlled exercises.  Complete these exercises as instructed by your physician, physical therapist or athletic trainer. Increase the resistance and repetitions only as guided.  You may experience muscle soreness or fatigue, but the pain or discomfort you are trying to eliminate should never worsen during these exercises. If this pain does get worse, stop and make sure you are following the directions exactly. If the pain is  still present after adjustments, discontinue the exercise until you can discuss the trouble with your clinician.  During your recovery, avoid activity or exercises which involve actions that place your injured hand or elbow above your head or behind your back or head. These positions stress the tissues which you are trying to heal. STRENGTH - Scapular Depression and Adduction   With good posture, sit on a firm chair. Support your arms in front of you, with pillows, arm rests, or on a table top. Have your elbows in line with the sides of your body.  Gently draw your shoulder blades down and toward your mid-back spine. Gradually increase the tension, without tensing the muscles along the top of your shoulders and the back of your neck.  Hold for __________ seconds. Slowly release the tension and relax your muscles completely before starting the next repetition.  After you have practiced this exercise, remove the arm support and complete the exercise in standing as well as sitting position. Repeat __________ times. Complete this exercise __________ times per day.  STRENGTH - Shoulder Abductors, Isometric  With good posture, stand or sit about 4-6 inches from a wall, with your right / left side facing the wall.  Bend your right / left elbow. Gently press your right / left elbow into the wall. Increase the pressure gradually, until you are pressing as hard as you can, without shrugging your shoulder or increasing any shoulder discomfort.  Hold for __________ seconds.  Release the tension slowly. Relax your shoulder muscles completely before you begin the next repetition. Repeat __________ times. Complete this exercise __________ times per day.  STRENGTH - External Rotators, Isometric  Keep your right / left elbow at your side and bend it 90 degrees.  Step into a door frame so that the outside of your right / left wrist can press against the door frame without your upper arm leaving your  side.  Gently press your right / left wrist into the door frame, as if you were trying to swing the back of your hand away from your stomach. Gradually increase the tension, until you are pressing as hard as you can, without shrugging your shoulder or increasing any shoulder discomfort.  Hold for __________ seconds.  Release the tension slowly. Relax your shoulder muscles completely before you begin the next repetition. Repeat __________ times. Complete this exercise __________ times per day.  STRENGTH - Supraspinatus   Stand or sit with good posture. Grasp a __________ weight, or an exercise band or tubing, so that your hand is "thumbs-up," like you are shaking hands.  Slowly lift your right / left arm in a "V" away from your thigh, diagonally into the space between your side and straight ahead. Lift  your hand to shoulder height or as far as you can, without increasing any shoulder pain. At first, many people do not lift their hands above shoulder height.  Avoid shrugging your right / left shoulder as your arm rises, by keeping your shoulder blade tucked down and toward your mid-back spine.  Hold for __________ seconds. Control the descent of your hand, as you slowly return to your starting position. Repeat __________ times. Complete this exercise __________ times per day.  STRENGTH - External Rotators  Secure a rubber exercise band or tubing to a fixed object (table, pole) so that it is at the same height as your right / left elbow when you are standing or sitting on a firm surface.  Stand or sit so that the secured exercise band is at your uninjured side.  Bend your right / left elbow 90 degrees. Place a folded towel or small pillow under your right / left arm, so that your elbow is a few inches away from your side.  Keeping the tension on the exercise band, pull it away from your body, as if pivoting on your elbow. Be sure to keep your body steady, so that the movement is coming only  from your rotating shoulder.  Hold for __________ seconds. Release the tension in a controlled manner, as you return to the starting position. Repeat __________ times. Complete this exercise __________ times per day.  STRENGTH - Internal Rotators   Secure a rubber exercise band or tubing to a fixed object (table, pole) so that it is at the same height as your right / left elbow when you are standing or sitting on a firm surface.  Stand or sit so that the secured exercise band is at your right / left side.  Bend your elbow 90 degrees. Place a folded towel or small pillow under your right / left arm so that your elbow is a few inches away from your side.  Keeping the tension on the exercise band, pull it across your body, toward your stomach. Be sure to keep your body steady, so that the movement is coming only from your rotating shoulder.  Hold for __________ seconds. Release the tension in a controlled manner, as you return to the starting position. Repeat __________ times. Complete this exercise __________ times per day.  STRENGTH - Scapular Protractors, Standing   Stand arms length away from a wall. Place your hands on the wall, keeping your elbows straight.  Begin by dropping your shoulder blades down and toward your mid-back spine.  To strengthen your protractors, keep your shoulder blades down, but slide them forward on your rib cage. It will feel as if you are lifting the back of your rib cage away from the wall. This is a subtle motion and can be challenging to complete. Ask your caregiver for further instruction, if you are not sure you are doing the exercise correctly.  Hold for __________ seconds. Slowly return to the starting position, resting the muscles completely before starting the next repetition. Repeat __________ times. Complete this exercise __________ times per day. STRENGTH - Scapular Protractors, Supine  Lie on your back on a firm surface. Extend your right / left  arm straight into the air while holding a __________ weight in your hand.  Keeping your head and back in place, lift your shoulder off the floor.  Hold for __________ seconds. Slowly return to the starting position, and allow your muscles to relax completely before starting the next repetition. Repeat __________  times. Complete this exercise __________ times per day. STRENGTH - Scapular Protractors, Quadruped  Get onto your hands and knees, with your shoulders directly over your hands (or as close as you can be, comfortably).  Keeping your elbows locked, lift the back of your rib cage up into your shoulder blades, so your mid-back rounds out. Keep your neck muscles relaxed.  Hold this position for __________ seconds. Slowly return to the starting position and allow your muscles to relax completely before starting the next repetition. Repeat __________ times. Complete this exercise __________ times per day.  STRENGTH - Scapular Retractors  Secure a rubber exercise band or tubing to a fixed object (table, pole), so that it is at the height of your shoulders when you are either standing, or sitting on a firm armless chair.  With a palm down grip, grasp an end of the band in each hand. Straighten your elbows and lift your hands straight in front of you, at shoulder height. Step back, away from the secured end of the band, until it becomes tense.  Squeezing your shoulder blades together, draw your elbows back toward your sides, as you bend them. Keep your upper arms lifted away from your body throughout the exercise.  Hold for __________ seconds. Slowly ease the tension on the band, as you reverse the directions and return to the starting position. Repeat __________ times. Complete this exercise __________ times per day. STRENGTH - Shoulder Extensors   Secure a rubber exercise band or tubing to a fixed object (table, pole) so that it is at the height of your shoulders when you are either  standing, or sitting on a firm armless chair.  With a thumbs-up grip, grasp an end of the band in each hand. Straighten your elbows and lift your hands straight in front of you, at shoulder height. Step back, away from the secured end of the band, until it becomes tense.  Squeezing your shoulder blades together, pull your hands down to the sides of your thighs. Do not allow your hands to go behind you.  Hold for __________ seconds. Slowly ease the tension on the band, as you reverse the directions and return to the starting position. Repeat __________ times. Complete this exercise __________ times per day.  STRENGTH - Scapular Retractors and External Rotators   Secure a rubber exercise band or tubing to a fixed object (table, pole) so that it is at the height as your shoulders, when you are either standing, or sitting on a firm armless chair.  With a palm down grip, grasp an end of the band in each hand. Bend your elbows 90 degrees and lift your elbows to shoulder height, at your sides. Step back, away from the secured end of the band, until it becomes tense.  Squeezing your shoulder blades together, rotate your shoulders so that your upper arms and elbows remain stationary, but your fists travel upward to head height.  Hold for __________ seconds. Slowly ease the tension on the band, as you reverse the directions and return to the starting position. Repeat __________ times. Complete this exercise __________ times per day.  STRENGTH - Scapular Retractors and External Rotators, Rowing   Secure a rubber exercise band or tubing to a fixed object (table, pole) so that it is at the height of your shoulders, when you are either standing, or sitting on a firm armless chair.  With a palm down grip, grasp an end of the band in each hand. Straighten your elbows and  lift your hands straight in front of you, at shoulder height. Step back, away from the secured end of the band, until it becomes  tense.  Step 1: Squeeze your shoulder blades together. Bending your elbows, draw your hands to your chest, as if you are rowing a boat. At the end of this motion, your hands and elbow should be at shoulder height and your elbows should be out to your sides.  Step 2: Rotate your shoulders, to raise your hands above your head. Your forearms should be vertical and your upper arms should be horizontal.  Hold for __________ seconds. Slowly ease the tension on the band, as you reverse the directions and return to the starting position. Repeat __________ times. Complete this exercise __________ times per day.  STRENGTH - Scapular Depressors  Find a sturdy chair without wheels, such as a dining room chair.  Keeping your feet on the floor, and your hands on the chair arms, lift your bottom up from the seat, and lock your elbows.  Keeping your elbows straight, allow gravity to pull your body weight down. Your shoulders will rise toward your ears.  Raise your body against gravity by drawing your shoulder blades down your back, shortening the distance between your shoulders and ears. Although your feet should always maintain contact with the floor, your feet should progressively support less body weight, as you get stronger.  Hold for __________ seconds. In a controlled and slow manner, lower your body weight to begin the next repetition. Repeat __________ times. Complete this exercise __________ times per day.    This information is not intended to replace advice given to you by your health care provider. Make sure you discuss any questions you have with your health care provider.   Document Released: 02/19/2005 Document Revised: 03/12/2014 Document Reviewed: 06/03/2008 Elsevier Interactive Patient Education [employer].

## 2014-12-24 NOTE — Assessment & Plan Note (Signed)
Symptoms and exam consistent with calcific tendinitis or subacromial bursitis. Recommend sling 48 hours. Ice 2-4 times per day or as needed and home exercise therapy. Refill naproxen. Refer to orthopedics for possible joint injection or further evaluation.

## 2015-01-06 ENCOUNTER — Telehealth: Payer: Self-pay | Admitting: Gastroenterology

## 2015-01-06 DIAGNOSIS — K209 Esophagitis, unspecified without bleeding: Secondary | ICD-10-CM

## 2015-01-06 DIAGNOSIS — K219 Gastro-esophageal reflux disease without esophagitis: Secondary | ICD-10-CM

## 2015-01-06 MED ORDER — OMEPRAZOLE 20 MG PO CPDR: 20.0000 mg | DELAYED_RELEASE_CAPSULE | Freq: Two times a day (BID) | ORAL | Status: DC

## 2015-01-06 NOTE — Telephone Encounter (Signed)
Called pt and informed her that omeprazole has been sent to er pharmacy until her appointment with Dr. Havery Moros in January.

## 2015-01-13 ENCOUNTER — Telehealth: Payer: Self-pay | Admitting: Gastroenterology

## 2015-01-13 NOTE — Telephone Encounter (Signed)
Completed prior auth. Called pt and informed her.

## 2015-02-09 HISTORY — PX: ROTATOR CUFF REPAIR: SHX139

## 2015-03-09 ENCOUNTER — Ambulatory Visit: Payer: BC Managed Care – PPO | Admitting: Gastroenterology

## 2015-05-25 ENCOUNTER — Telehealth: Payer: Self-pay | Admitting: Gastroenterology

## 2015-05-25 DIAGNOSIS — K209 Esophagitis, unspecified without bleeding: Secondary | ICD-10-CM

## 2015-05-25 DIAGNOSIS — K219 Gastro-esophageal reflux disease without esophagitis: Secondary | ICD-10-CM

## 2015-05-25 MED ORDER — OMEPRAZOLE 20 MG PO CPDR: 20.0000 mg | DELAYED_RELEASE_CAPSULE | Freq: Two times a day (BID) | ORAL | Status: DC

## 2015-05-25 NOTE — Telephone Encounter (Signed)
Spoke with patient and she states she is taking Ranitidine daily but has stopped the Omeprazole because her pharmacy stated they did not get a new rx for it. She states she is having nausea when she eats. She felt better prior to stopping Omeprazole. Refilled this for patient until her OV.

## 2015-07-06 ENCOUNTER — Ambulatory Visit (INDEPENDENT_AMBULATORY_CARE_PROVIDER_SITE_OTHER): Payer: BC Managed Care – PPO | Admitting: Gastroenterology

## 2015-07-06 ENCOUNTER — Encounter: Payer: Self-pay | Admitting: Gastroenterology

## 2015-07-06 VITALS — BP 120/88 | HR 80 | Ht 63.75 in | Wt 213.0 lb

## 2015-07-06 DIAGNOSIS — Z86010 Personal history of colonic polyps: Secondary | ICD-10-CM

## 2015-07-06 DIAGNOSIS — R1013 Epigastric pain: Secondary | ICD-10-CM | POA: Diagnosis not present

## 2015-07-06 DIAGNOSIS — K2100 Gastro-esophageal reflux disease with esophagitis, without bleeding: Secondary | ICD-10-CM

## 2015-07-06 DIAGNOSIS — R1310 Dysphagia, unspecified: Secondary | ICD-10-CM | POA: Diagnosis not present

## 2015-07-06 MED ORDER — RANITIDINE HCL 150 MG PO TABS: 150.0000 mg | ORAL_TABLET | Freq: Two times a day (BID) | ORAL | Status: DC

## 2015-07-06 NOTE — Patient Instructions (Addendum)
Take your omeprazole twice a day Zantac 150 mg  morning and a night   You have been scheduled for an abdominal ultrasound at [facility]iology (1st floor of hospital) on Monday  May 8th  at  8:30am  . Please arrive 15 minutes prior to your appointment for registration. Make certain not to have anything to eat or drink 6 hours prior to your appointment. Should you need to reschedule your appointment, please contact radiology at [phone]. This test typically takes about 30 minutes to perform.

## 2015-07-06 NOTE — Progress Notes (Signed)
HPI :  60 y/o female, previously followed by Dr. Olevia Perches, here for a follow up for nausea / vomiting, GERD, abdominal discomfort.    She reports some ongoing symptoms of reflux. She thinks this has been ongoing for a few years, but worsening recently. She has some soreness after she eats in her epigastric area which is her main symptom of reflux. She does endorse some rare dysphagia at times to solids, and as well to liquids. She notices this a few times per week. She reliably has some discomfort after she eats. She denies burning pyrosis into her chest. She also feels a knot in her epigastric area whch she endorses is tender. She is currently taking 20mg  prilosec per day, zantac 150mg  q day. Her predominant symptom from "reflux" has been discomfort in her epigastric area historically, rarely pyrosis. Symptoms get worse if she does not take her prilosec. She reports her symptoms are worse when she lies down and bothering her most at night when she sleeps. She denies weight loss. Aunt had esophageal cancer. Second degree relatives had colon cancer.  She is concerned about the possibility of esophageal cancer. She avoids eating prior to bedtime, has not tried sleeping with head of bed elevated.   Prior endoscopic evaluation.  EGD - 08/27/14 - LA grade A esophagitis, small hiatal hernia, otherwise normal Colonoscopy - 02/20/13 - 35mm sigmoid adenoma, due in 2019  Past Medical History  Diagnosis Date  . Vitamin D deficiency   . History of chicken pox   . H/O mumps   . H/O measles   . Panic attacks 2005  . Allergy   . GERD (gastroesophageal reflux disease)   . Wears partial dentures     upper partial  . Gastric ulcer   . Anxiety   . Headache     history of migraines  . Shingles   . Migraines      Past Surgical History  Procedure Laterality Date  . Appendectomy    . Tubal ligation    . Bunionectomy Right 05/01/12    [place], right foot  . Remove implant deep Right 05/01/12/    Right foot    . Colonoscopy    . Cholecystectomy N/A 03/18/2013    Procedure: LAPAROSCOPIC CHOLECYSTECTOMY;  Surgeon: Harl Bowie, MD;  Location: [place];  Service: General;  Laterality: N/A;  . Endometrial biopsy  Rotator  . Bunionectomy Left   . Upper gi endoscopy    . Dilatation & currettage/hysteroscopy with resectocope N/A 09/07/2014    Procedure: DILATATION & CURETTAGE/HYSTEROSCOPY WITH RESECTOCOPE;  Surgeon: Everett Graff, MD;  Location: [facility];  Service: Gynecology;  Laterality: N/A;  . Rotator cuff repair  02/09/2015    right   Family History  Problem Relation Age of Onset  . Hypertension Mother   . Diabetes Mother   . Heart disease Sister   . Hypertension Sister   . Hypertension Brother   . Heart disease Maternal Aunt   . Cancer Maternal Grandmother     female cancer ? type  . Heart attack Brother     x 3  . Colon cancer Maternal Grandfather   . Prostate cancer Maternal Uncle   . Lung cancer Paternal Uncle   . Clotting disorder Brother    Social History  Substance Use Topics  . Smoking status: Never Smoker   . Smokeless tobacco: Never Used  . Alcohol Use: No   Current Outpatient Prescriptions  Medication Sig Dispense Refill  .  co-enzyme Q-10 30 MG capsule Take 30 mg by mouth 3 (three) times daily.    . Omega-3 Fatty Acids (FISH OIL) 1000 MG CAPS Take 1,000 mg by mouth daily.    Marland Kitchen omeprazole (PRILOSEC) 20 MG capsule Take 1 capsule (20 mg total) by mouth 2 (two) times daily before a meal. 120 capsule 0  . ranitidine (ZANTAC) 300 MG capsule Take 1 capsule (300 mg total) by mouth every evening. 30 capsule 6   No current facility-administered medications for this visit.   Allergies  Allergen Reactions  . Codeine Nausea Only  . Asa [Aspirin] Hives     Review of Systems: All systems reviewed and negative except where noted in HPI.   Lab Results  Component Value Date   CREATININE 0.70 09/05/2014   BUN 4* 09/05/2014   NA 141 09/05/2014   K 3.8  09/05/2014   CL 104 09/05/2014   CO2 24 09/05/2014   Lab Results  Component Value Date   WBC 9.4 10/29/2014   HGB 12.3 10/29/2014   HCT 38 10/29/2014   MCV 82.9 09/05/2014   PLT 422* 10/29/2014     Physical Exam: BP 120/88 mmHg  Pulse 80  Ht 5' 3.75" (1.619 m)  Wt 213 lb (96.616 kg)  BMI 36.86 kg/m2  LMP 08/30/2014 (Exact Date) Constitutional: Pleasant,well-developed, female in no acute distress. HEENT: Normocephalic and atraumatic. Conjunctivae are normal. No scleral icterus. Neck supple.  Cardiovascular: Normal rate, regular rhythm.  Pulmonary/chest: Effort normal and breath sounds normal. No wheezing, rales or rhonchi. Abdominal: Soft, nondistended, focal tenderness in epigastric area - subcutaneous nodule palpated which I suspect is extension of the lower sternum / xyphoid process. Bowel sounds active throughout. There are no masses palpable. No hepatomegaly. Extremities: no edema Lymphadenopathy: No cervical adenopathy noted. Neurological: Alert and oriented to person place and time. Skin: Skin is warm and dry. No rashes noted. Psychiatric: Normal mood and affect. Behavior is normal.   ASSESSMENT AND PLAN: 60 y/o female here for follow up to address the following issues:  GERD - ongoing index symptom of epigastric discomfort (different from xyphoid process pain as below), may represent esophagitis as noted on prior EGD last year. She feels much worse if she stops PPI. I discussed options moving forward and counseled her on the risks of long term PPIs. The risks of long term PPIs with current data include increased risk for chronic kidney disease, increased risk of fracture, increased risk of C diff, increased risk of pneumonia (short term usage), potentially increased risk of B12 / calcium deficiency, and rare risk of hypomagnesemia. Recent studies have also shown an association with increased risk of dementia and cardiovascular outcomes including stroke. These studies have  showed an association between PPIs and several of these outcomes but no evidence of causality. The patient was counseled to use the lowest daily use of PPI needed to control symptoms. Renal function is currently normal. At this time given her ongoing symptoms recommend increasing omeprazole to 20mg  BID and she can increase zantac to BID PRN as well. We will see if this works to help her symptoms. If no benefit she should contact me for reassessment. I reassured her no evidence of esophageal cancer on last EGD or Barrett's and she is low risk for esophageal cancer.   Dysphagia - occasional symptoms, prior EGD and barium study normal without etiology for symptoms of dysphagia. Could be related to esophagitis, will await treatment course of GERD, if both symptoms perssit will proceed with  manometry and 24 HR pH impedance testing. She agreed.   Other epigastric pain  - reproduced on exam, suspect due to costochondritis at what I suspect is the xyphoid process and is palpation, but will order limited abdominal wall [place] to ensure correlates with exam and provide reassurance as she is quite concerned about this.   History of polyps - recall colonoscopy in 2019  Bond Cellar, MD [facility] Pager [phone]

## 2015-07-11 ENCOUNTER — Ambulatory Visit (HOSPITAL_COMMUNITY)
Admission: RE | Admit: 2015-07-11 | Discharge: 2015-07-11 | Disposition: A | Discharge status: Home / Self Care | Payer: BC Managed Care – PPO | Source: Ambulatory Visit | Attending: Gastroenterology | Admitting: Gastroenterology

## 2015-07-11 DIAGNOSIS — R1013 Epigastric pain: Secondary | ICD-10-CM | POA: Insufficient documentation

## 2015-07-11 DIAGNOSIS — R935 Abnormal findings on diagnostic imaging of other abdominal regions, including retroperitoneum: Secondary | ICD-10-CM | POA: Diagnosis not present

## 2015-07-12 ENCOUNTER — Other Ambulatory Visit (INDEPENDENT_AMBULATORY_CARE_PROVIDER_SITE_OTHER): Payer: BC Managed Care – PPO

## 2015-07-12 ENCOUNTER — Other Ambulatory Visit: Payer: Self-pay | Admitting: *Deleted

## 2015-07-12 ENCOUNTER — Encounter: Payer: Self-pay | Admitting: *Deleted

## 2015-07-12 DIAGNOSIS — R938 Abnormal findings on diagnostic imaging of other specified body structures: Secondary | ICD-10-CM | POA: Diagnosis not present

## 2015-07-12 DIAGNOSIS — R9389 Abnormal findings on diagnostic imaging of other specified body structures: Secondary | ICD-10-CM

## 2015-07-12 LAB — BUN: BUN: 10 mg/dL (ref 6–23)

## 2015-07-12 LAB — CREATININE, SERUM: Creatinine, Ser: 0.72 mg/dL (ref 0.40–1.20)

## 2015-07-13 ENCOUNTER — Ambulatory Visit (INDEPENDENT_AMBULATORY_CARE_PROVIDER_SITE_OTHER)
Admission: RE | Admit: 2015-07-13 | Discharge: 2015-07-13 | Disposition: A | Discharge status: Home / Self Care | Payer: BC Managed Care – PPO | Source: Ambulatory Visit | Attending: Gastroenterology | Admitting: Gastroenterology

## 2015-07-13 DIAGNOSIS — R938 Abnormal findings on diagnostic imaging of other specified body structures: Secondary | ICD-10-CM | POA: Diagnosis not present

## 2015-07-13 DIAGNOSIS — R9389 Abnormal findings on diagnostic imaging of other specified body structures: Secondary | ICD-10-CM

## 2015-07-13 MED ORDER — IOPAMIDOL (ISOVUE-300) INJECTION 61%
80.0000 mL | Freq: Once | INTRAVENOUS | Status: AC | PRN
  Administered 2015-07-13: 80 mL via INTRAVENOUS

## 2015-08-13 IMAGING — US US ABDOMEN COMPLETE
1 series · 13 of 25 positions shown · non-contrast
Comparison: CT of the abdomen and pelvis on 11/28/2010.

CLINICAL DATA: Periumbilical abdominal pain and nausea.

EXAM:
ULTRASOUND ABDOMEN COMPLETE

[Series 1: us abdomen complete · 0.24mm/px · 13 of 65 slices shown]
[im 1/65]
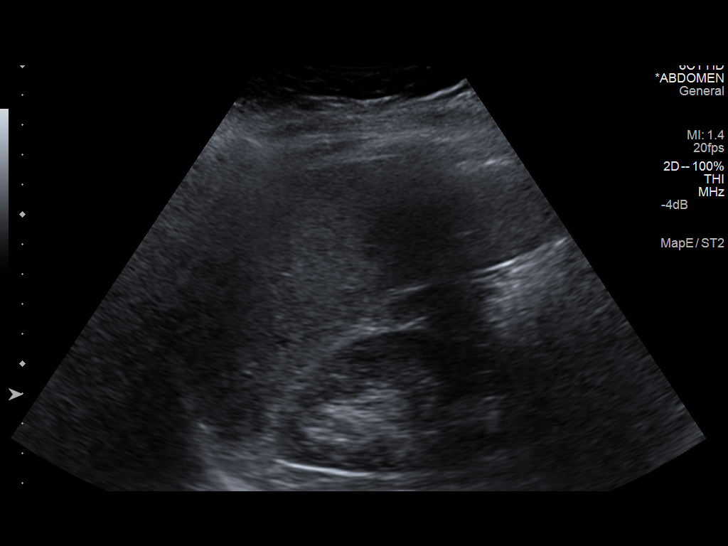
[im 6/65]
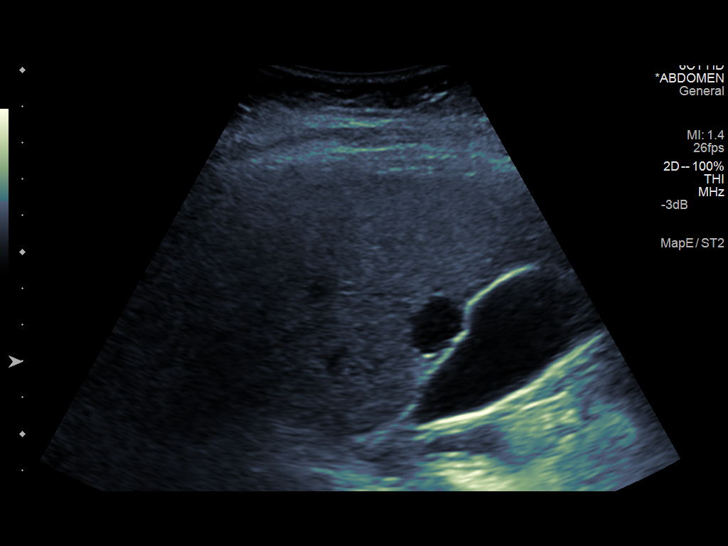
[im 11/65]
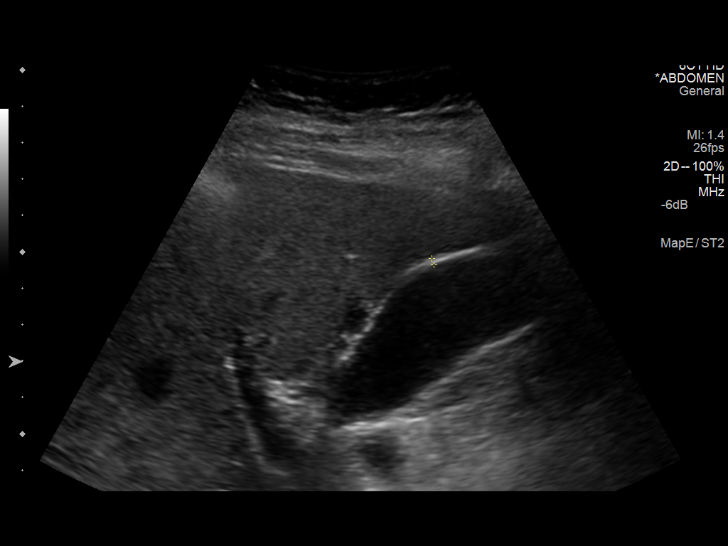
[im 17/65]
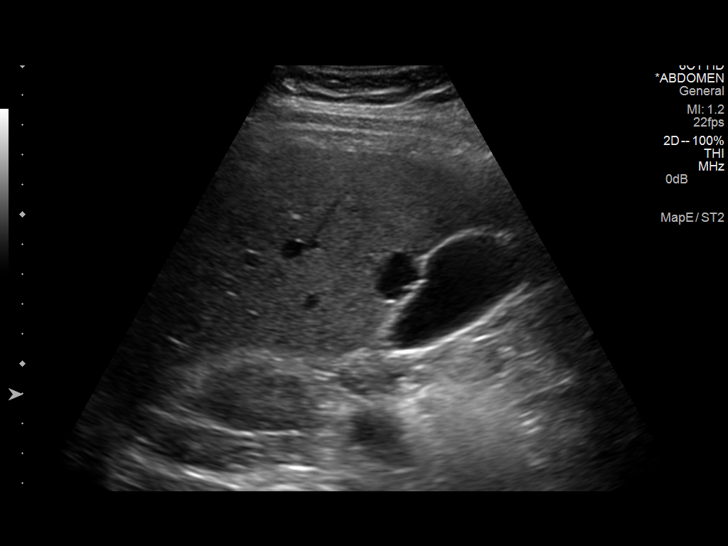
[im 22/65]
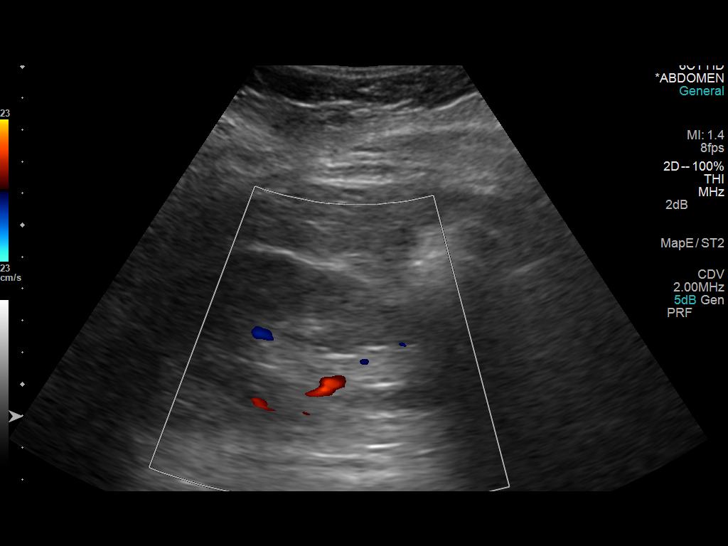
[im 27/65]
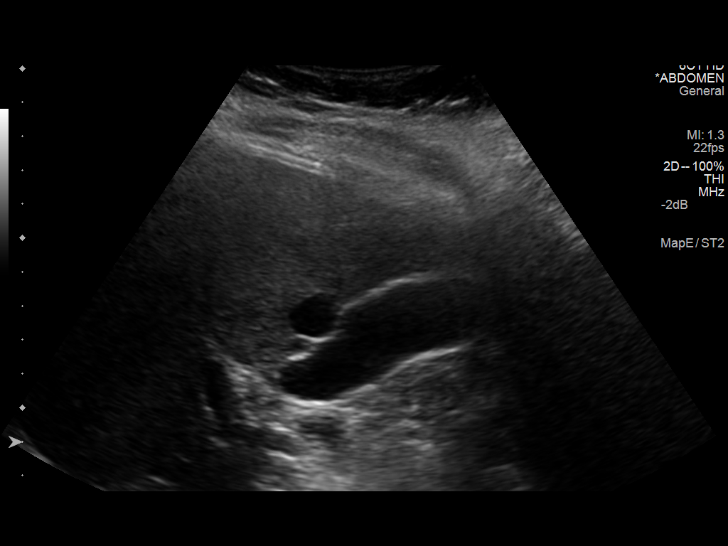
[im 33/65]
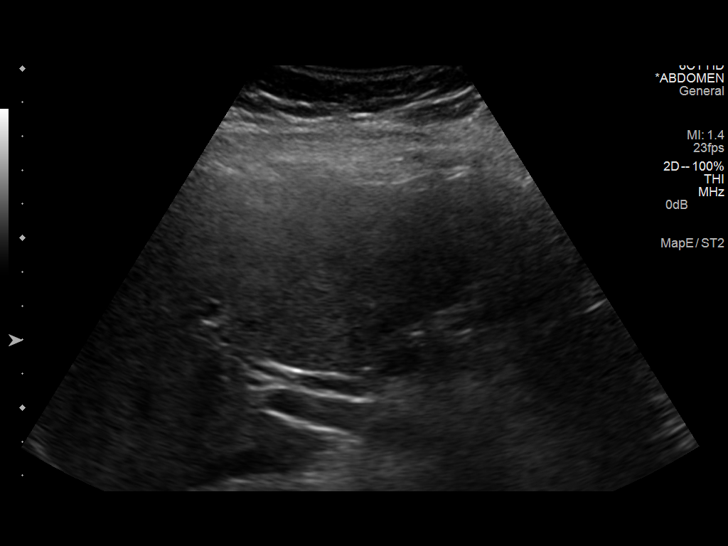
[im 38/65]
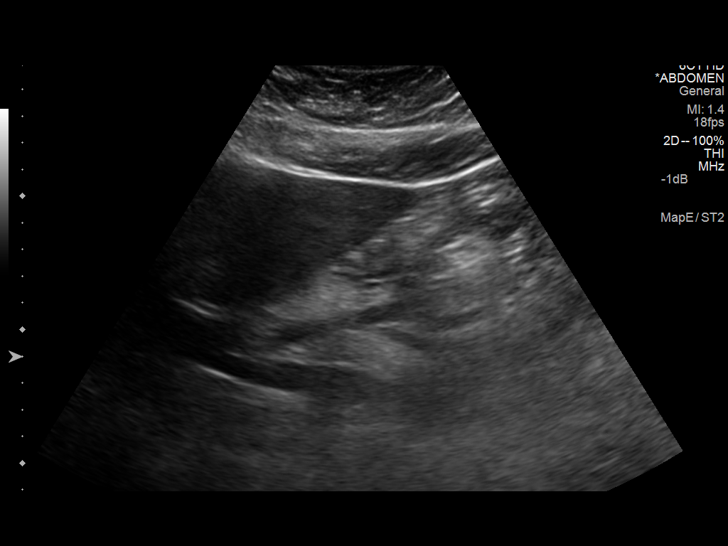
[im 43/65]
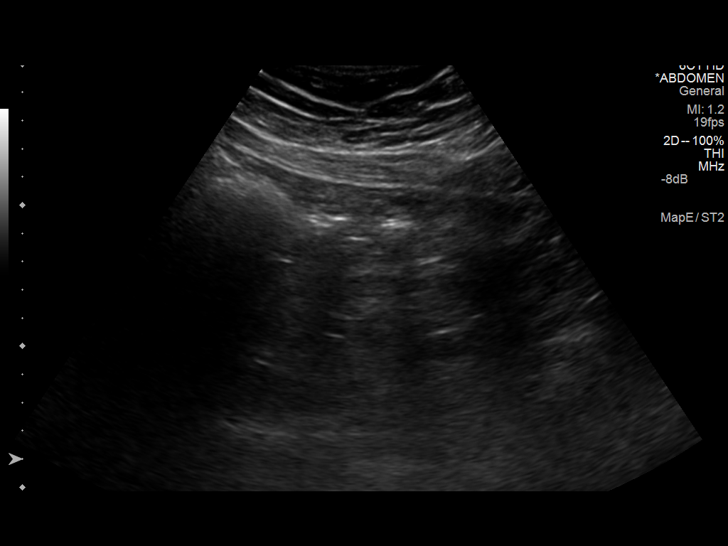
[im 49/65]
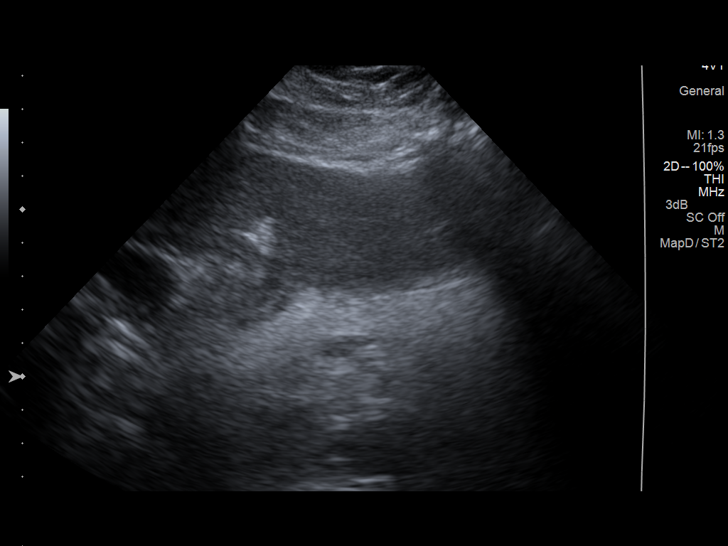
[im 54/65]
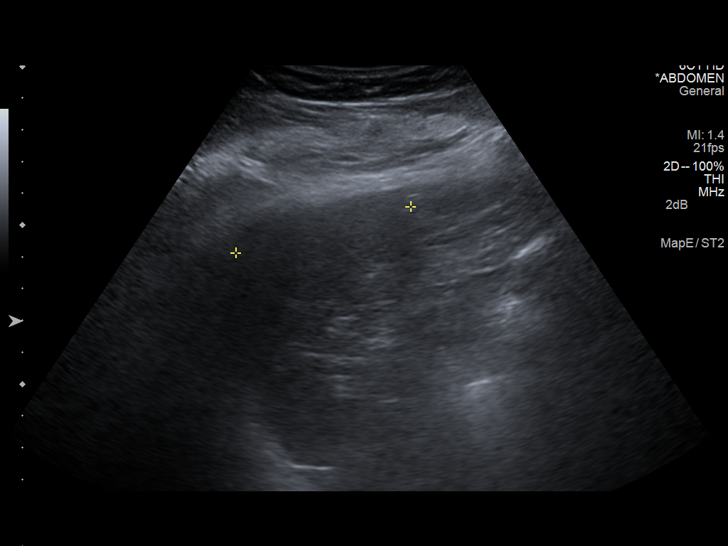
[im 59/65]
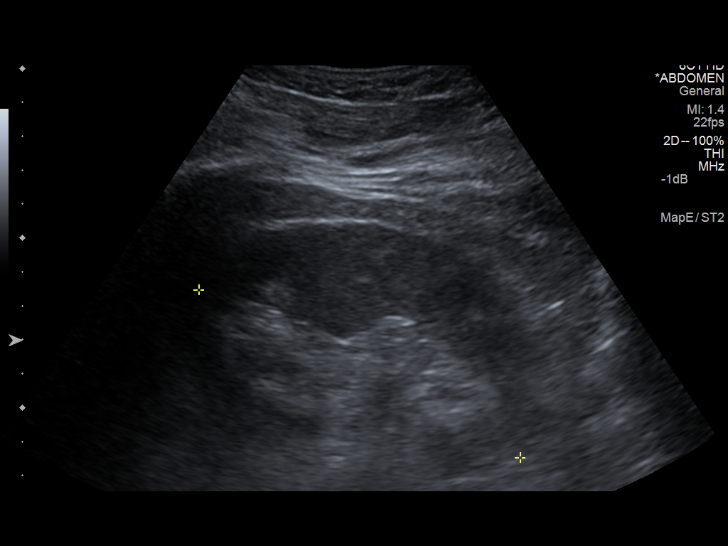
[im 65/65]
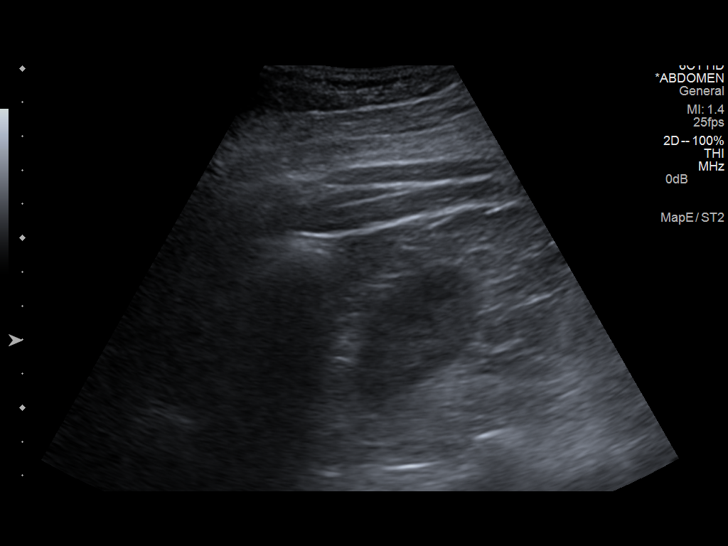

[13 of 25 positions shown; findings below may reference images not displayed]

FINDINGS: Gallbladder:

No gallstones or wall thickening visualized. No sonographic Murphy
sign noted.

Common bile duct:

Diameter: Measured maximal diameter of 5.5 mm.

Liver:

Central right lobe cyst has a benign appearance and measures 1.5 x
1.5 x 1.4 cm. This is also seen by prior CT. No solid masses are
identified. Parenchymal echotexture is heterogeneous and there
likely is a component of steatosis. No evidence of biliary ductal
dilatation. The portal vein is open.

IVC:

No abnormality visualized.

Pancreas:

Visualized portion unremarkable.

Spleen:

Size and appearance within normal limits.

Right Kidney:

Length: 10.7 cm. Echogenicity within normal limits. No mass or
hydronephrosis visualized.

Left Kidney:

Length: 10.7 cm. Echogenicity within normal limits. No mass or
hydronephrosis visualized.

Abdominal aorta:

No aneurysm visualized.

Other findings:

None.
IMPRESSION: Unremarkable abdominal ultrasound. Probable component of hepatic
steatosis. 1.5 cm central hepatic cyst has a benign appearance.

## 2015-08-13 IMAGING — NM NM HEPATO W/GB/PHARM/[PERSON_NAME]
2 series · 12 of 12 positions shown · non-contrast
Comparison: None.

RADIOPHARMACEUTICALS:  5mCi Wc-OOm Choletec

CLINICAL DATA: Abdominal pain.

EXAM:
NUCLEAR MEDICINE HEPATOBILIARY IMAGING WITH GALLBLADDER EF
TECHNIQUE: Sequential images of the abdomen were obtained [DATE] minutes
following intravenous administration of radiopharmaceutical. After
oral ingestion of 8 ounces of Ensure, gallbladder ejection fraction
was determined.

[Series 0: hepatobiliary · 3.20mm/px · 6 of 55 frames shown (1 of 2)]
[frame 5/55]
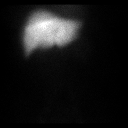
[frame 14/55]
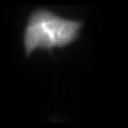
[frame 23/55]
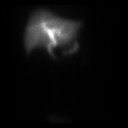
[frame 32/55]
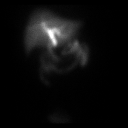
[frame 41/55]
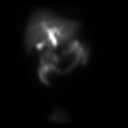
[frame 51/55]
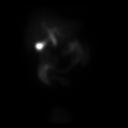

[Series 0: hepatobiliary · 3.20mm/px · 6 of 60 frames shown (2 of 2)]
[frame 6/60]
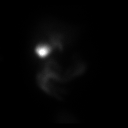
[frame 16/60]
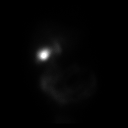
[frame 26/60]
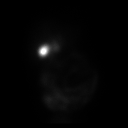
[frame 36/60]
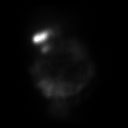
[frame 46/60]
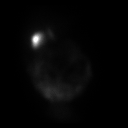
[frame 56/60]
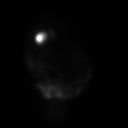

[12 of 12 positions shown; findings below may reference images not displayed]

FINDINGS: There is immediate homogeneous uptake of radiotracer in the liver.
Filling of the gallbladder begins at 45 minutes. Radiotracer uptake
is present in the small bowel at 30 minutes.

When gallbladder filling was complete, the patient was given PO
Ensure. At 60 minutes, the total ejection fraction was 20.7% which
is below the limits of normal. The normal range of ejection fraction
is 33%.

*** Due to patient motion, graft hole representation of gallbladder
ejection fraction could not be obtained. Manual calculations were
done to calculate the ejection fraction. ***

The patient did not experience symptoms after oral ingestion of
Ensure.
IMPRESSION: 1.  No biliary obstruction.

2. Abnormal low gallbladder ejection fraction as can be seen with
biliary dyskinesia.

## 2015-10-17 ENCOUNTER — Other Ambulatory Visit: Payer: Self-pay | Admitting: Obstetrics and Gynecology

## 2015-10-17 DIAGNOSIS — Z1231 Encounter for screening mammogram for malignant neoplasm of breast: Secondary | ICD-10-CM

## 2015-10-26 ENCOUNTER — Ambulatory Visit: Payer: BC Managed Care – PPO

## 2016-06-19 ENCOUNTER — Ambulatory Visit (INDEPENDENT_AMBULATORY_CARE_PROVIDER_SITE_OTHER): Payer: BC Managed Care – PPO | Admitting: Gastroenterology

## 2016-06-19 ENCOUNTER — Encounter: Payer: Self-pay | Admitting: Gastroenterology

## 2016-06-19 VITALS — HR 79 | Ht 63.75 in | Wt 213.0 lb

## 2016-06-19 DIAGNOSIS — K2100 Gastro-esophageal reflux disease with esophagitis, without bleeding: Secondary | ICD-10-CM

## 2016-06-19 DIAGNOSIS — R110 Nausea: Secondary | ICD-10-CM

## 2016-06-19 DIAGNOSIS — R1013 Epigastric pain: Secondary | ICD-10-CM | POA: Diagnosis not present

## 2016-06-19 MED ORDER — OMEPRAZOLE 40 MG PO CPDR: 40.0000 mg | DELAYED_RELEASE_CAPSULE | Freq: Every day | ORAL | 5 refills | Status: DC

## 2016-06-19 MED ORDER — RANITIDINE HCL 150 MG PO TABS: 150.0000 mg | ORAL_TABLET | Freq: Two times a day (BID) | ORAL | 2 refills | Status: DC | PRN

## 2016-06-19 NOTE — Patient Instructions (Addendum)
Your physician has requested that you go to the basement for the following lab work before leaving today: CBC, CMET, Lipase  We have sent the following medications to your pharmacy for you to pick up at your convenience: Omeprazole 40mg  daily Zantac 150mg  twice a day as needed  Follow up with Dr Havery Moros in 6 weeks.

## 2016-06-19 NOTE — Progress Notes (Signed)
06/19/2016 Kelly Abbott [id number] March 30, 1955   HISTORY OF PRESENT ILLNESS:  This is a 61 year old female who is known to Dr. Havery Moros for complaints of epigastric abdominal pain, nausea, and GERD.  EGD - 08/27/14 - LA grade A esophagitis, small hiatal hernia, otherwise normal.  She then had a CT scan of the abdomen with contrast performed in May 2017 at which time it did not show any concerning findings to explain her symptoms.  She returns today with the same complaints. Reports epigastric abdominal pain/burning after everything that she eats. Says that she gets nauseated and has a sensation that she wants to throw up. She had previously been on omeprazole 20 mg twice daily and ranitidine 150 mg twice daily as needed. She seemed to do well on those medications previously, but has not been taking them for the past several months. She says that she is under extreme amount of stress recently. Her mother passed away in the fall and then she had a brother who just passed away a couple of weeks ago. She says that when she gets upset the discomfort seems to worsen. She expresses her concern today regarding fear of cancer.   Past Medical History:  Diagnosis Date  . Allergy   . Anxiety   . Gastric ulcer   . GERD (gastroesophageal reflux disease)   . H/O measles   . H/O mumps   . Headache    history of migraines  . History of chicken pox   . Migraines   . Panic attacks 2005  . Shingles   . Vitamin D deficiency   . Wears partial dentures    upper partial   Past Surgical History:  Procedure Laterality Date  . APPENDECTOMY    . BUNIONECTOMY Right 05/01/12   [place], right foot  . BUNIONECTOMY Left   . CHOLECYSTECTOMY N/A 03/18/2013   Procedure: LAPAROSCOPIC CHOLECYSTECTOMY;  Surgeon: Harl Bowie, MD;  Location: [place];  Service: General;  Laterality: N/A;  . COLONOSCOPY    . DILATATION & CURRETTAGE/HYSTEROSCOPY WITH RESECTOCOPE N/A 09/07/2014   Procedure: DILATATION & CURETTAGE/HYSTEROSCOPY WITH RESECTOCOPE;  Surgeon: Everett Graff, MD;  Location: [facility];  Service: Gynecology;  Laterality: N/A;  . ENDOMETRIAL BIOPSY  Rotator  . Remove Implant Deep Right 05/01/12/   Right foot  . ROTATOR CUFF REPAIR  02/09/2015   right  . TUBAL LIGATION    . UPPER GI ENDOSCOPY      reports that she has never smoked. She has never used smokeless tobacco. She reports that she does not drink alcohol or use drugs. family history includes Cancer in her maternal grandmother; Clotting disorder in her brother; Colon cancer in her maternal grandfather; Diabetes in her mother; Heart attack in her brother; Heart disease in her maternal aunt and sister; Hypertension in her brother, mother, and sister; Lung cancer in her paternal uncle; Prostate cancer in her maternal uncle. Allergies  Allergen Reactions  . Codeine Nausea Only  . Asa [Aspirin] Hives      Outpatient Encounter Prescriptions as of 06/19/2016  Medication Sig  . co-enzyme Q-10 30 MG capsule Take 30 mg by mouth 3 (three) times daily.  . Omega-3 Fatty Acids (FISH OIL) 1000 MG CAPS Take 1,000 mg by mouth daily.  Marland Kitchen omeprazole (PRILOSEC) 20 MG capsule Take 1 capsule (20 mg total) by mouth 2 (two) times daily before a meal.  . ranitidine (ZANTAC) 150 MG tablet Take 1 tablet (150 mg total) by mouth  2 (two) times daily.  . ranitidine (ZANTAC) 300 MG capsule Take 1 capsule (300 mg total) by mouth every evening.   No facility-administered encounter medications on file as of 06/19/2016.      REVIEW OF SYSTEMS  : All other systems reviewed and negative except where noted in the History of Present Illness.   PHYSICAL EXAM: Pulse 79   Ht 5' 3.75" (1.619 m)   Wt 213 lb (96.6 kg)   LMP 08/30/2014 (Exact Date)   BMI 36.85 kg/m  General: Well developed white female in no acute distress Head: Normocephalic and atraumatic Eyes:  Sclerae anicteric, conjunctiva pink. Ears: Normal auditory acuity  Lungs:  Clear throughout to auscultation; no increased WOB Heart: Regular rate and rhythm Abdomen: Soft, non-distended.  BS present.  Mild epigastric TTP. Musculoskeletal: Symmetrical with no gross deformities  Skin: No lesions on visible extremities Extremities: No edema  Neurological: Alert oriented x 4, grossly non-focal Psychological:  Alert and cooperative. Normal mood and affect  ASSESSMENT AND PLAN: -GERD and chronic complaints of epigastric abdominal pain and nausea:  Worsened by stress and is under a lot of stress recently.  EGD and CT scan both negative within the past two years for similar symptoms.  No longer on PPI and had improved with acid medications in the past.  I am going to have her restart omeprazole 40 mg daily and zantac 150 mg BID prn, which is what she was on in the past.  Will check a CBC, CMP, and lipase.  Follow-up in approximately 6 weeks for update on symptoms.   CC:  [place], FNP

## 2016-06-20 ENCOUNTER — Other Ambulatory Visit (INDEPENDENT_AMBULATORY_CARE_PROVIDER_SITE_OTHER): Payer: BC Managed Care – PPO

## 2016-06-20 DIAGNOSIS — R1013 Epigastric pain: Secondary | ICD-10-CM

## 2016-06-20 DIAGNOSIS — K2100 Gastro-esophageal reflux disease with esophagitis, without bleeding: Secondary | ICD-10-CM | POA: Insufficient documentation

## 2016-06-20 DIAGNOSIS — R110 Nausea: Secondary | ICD-10-CM | POA: Diagnosis not present

## 2016-06-20 LAB — COMPLETE METABOLIC PANEL WITH GFR
ALK PHOS: 80 U/L (ref 33–130)
ALT: 11 U/L (ref 6–29)
AST: 13 U/L (ref 10–35)
Albumin: 3.9 g/dL (ref 3.6–5.1)
BUN: 10 mg/dL (ref 7–25)
CALCIUM: 9.6 mg/dL (ref 8.6–10.4)
CO2: 28 mmol/L (ref 20–31)
CREATININE: 0.76 mg/dL (ref 0.50–0.99)
Chloride: 104 mmol/L (ref 98–110)
GFR, Est African American: >89 mL/min (ref >=60)
GFR, Est Non African American: 85 mL/min (ref >=60)
Glucose, Bld: 92 mg/dL (ref 65–99)
Potassium: 5.1 mmol/L (ref 3.5–5.3)
Sodium: 139 mmol/L (ref 135–146)
Total Bilirubin: 0.4 mg/dL (ref 0.2–1.2)
Total Protein: 6.7 g/dL (ref 6.1–8.1)

## 2016-06-20 LAB — LIPASE: LIPASE: 21 U/L (ref 11.0–59.0)

## 2016-06-20 NOTE — Progress Notes (Signed)
Agree with assessment and plan. Symptoms likely related to reflux. Agree with resumption of omeprazole 40mg  daily, if only partial response can increase to BID dosing as needed. She can follow up as needed if symptoms persist.

## 2016-06-21 LAB — CBC WITH DIFFERENTIAL
BASOS: 0 %
Basophils Absolute: 0 10*3/uL (ref 0.0–0.2)
EOS (ABSOLUTE): 0.1 10*3/uL (ref 0.0–0.4)
Eos: 2 %
Hematocrit: 39.6 % (ref 34.0–46.6)
Hemoglobin: 13.1 g/dL (ref 11.1–15.9)
IMMATURE GRANS (ABS): 0 10*3/uL (ref 0.0–0.1)
Immature Granulocytes: 0 %
LYMPHS: 30 %
Lymphocytes Absolute: 2.6 10*3/uL (ref 0.7–3.1)
MCH: 27.3 pg (ref 26.6–33.0)
MCHC: 33.1 g/dL (ref 31.5–35.7)
MCV: 83 fL (ref 79–97)
Monocytes Absolute: 0.5 10*3/uL (ref 0.1–0.9)
Monocytes: 5 %
NEUTROS PCT: 63 %
Neutrophils Absolute: 5.4 10*3/uL (ref 1.4–7.0)
RBC: 4.8 x10E6/uL (ref 3.77–5.28)
RDW: 16 % — AB (ref 12.3–15.4)
WBC: 8.6 10*3/uL (ref 3.4–10.8)

## 2016-06-22 ENCOUNTER — Encounter: Payer: Self-pay | Admitting: Family

## 2016-06-22 ENCOUNTER — Ambulatory Visit (INDEPENDENT_AMBULATORY_CARE_PROVIDER_SITE_OTHER): Payer: BC Managed Care – PPO | Admitting: Family

## 2016-06-22 ENCOUNTER — Ambulatory Visit: Payer: BC Managed Care – PPO | Admitting: Family

## 2016-06-22 DIAGNOSIS — F4321 Adjustment disorder with depressed mood: Secondary | ICD-10-CM | POA: Insufficient documentation

## 2016-06-22 DIAGNOSIS — F4381 Prolonged grief disorder: Secondary | ICD-10-CM

## 2016-06-22 MED ORDER — TEMAZEPAM 15 MG PO CAPS: 15.0000 mg | ORAL_CAPSULE | Freq: Every evening | ORAL | 0 refills | Status: DC | PRN

## 2016-06-22 MED ORDER — ESCITALOPRAM OXALATE 10 MG PO TABS: 10.0000 mg | ORAL_TABLET | Freq: Every day | ORAL | 1 refills | Status: DC

## 2016-06-22 NOTE — Assessment & Plan Note (Signed)
Symptoms and exam consistent with grief reaction secondary to loss of to family members within the last 6 months. No suicidal ideations. Discussed importance of counseling and speaking with her pastor/preacher. Start Lexapro for depression. Start temazepam as needed for sleep. Follow-up in one month or sooner if needed.

## 2016-06-22 NOTE — Progress Notes (Signed)
Subjective:    Patient ID: Kelly Abbott, female    DOB: 12/01/1955, 61 y.o.   MRN: [id number]  Chief Complaint  Patient presents with  . Depression    anxiety and depression     HPI:  Kelly Abbott is a 61 y.o. female who  has a past medical history of Allergy; Anxiety; Gastric ulcer; GERD (gastroesophageal reflux disease); H/O measles; H/O mumps; Headache; History of chicken pox; Migraines; Panic attacks (2005); Shingles; Vitamin D deficiency; and Wears partial dentures. and presents today for an office visit.  Anxiety and depression - Associated symptoms of decreased sleep, lashing out and having a short temper, and feeling depressed has been going on for several months which started when she her mother died in 01/29/23 and her brother that passed within the past couple of weeks. Her family is primarily in [place] with her sister and husband being local. No suicidal ideations. Does feel stressed and the severity is enough to cause her to have some abdominal pains. She has been on anxiety medication in the past which she does not recall the name.   Allergies  Allergen Reactions  . Codeine Nausea Only  . Asa [Aspirin] Hives      Outpatient Medications Prior to Visit  Medication Sig Dispense Refill  . co-enzyme Q-10 30 MG capsule Take 30 mg by mouth 3 (three) times daily.    . Omega-3 Fatty Acids (FISH OIL) 1000 MG CAPS Take 1,000 mg by mouth daily.    Marland Kitchen omeprazole (PRILOSEC) 40 MG capsule Take 1 capsule (40 mg total) by mouth daily. 30 capsule 5  . ranitidine (ZANTAC) 150 MG tablet Take 1 tablet (150 mg total) by mouth 2 (two) times daily as needed for heartburn. 30 tablet 2   No facility-administered medications prior to visit.       Past Surgical History:  Procedure Laterality Date  . APPENDECTOMY    . BUNIONECTOMY Right 05/01/12   [place], right foot  . BUNIONECTOMY Left   . CHOLECYSTECTOMY N/A 03/18/2013   Procedure: LAPAROSCOPIC CHOLECYSTECTOMY;   Surgeon: Harl Bowie, MD;  Location: [facility];  Service: General;  Laterality: N/A;  . COLONOSCOPY    . DILATATION & CURRETTAGE/HYSTEROSCOPY WITH RESECTOCOPE N/A 09/07/2014   Procedure: DILATATION & CURETTAGE/HYSTEROSCOPY WITH RESECTOCOPE;  Surgeon: Everett Graff, MD;  Location: [facility];  Service: Gynecology;  Laterality: N/A;  . ENDOMETRIAL BIOPSY  Rotator  . Remove Implant Deep Right 05/01/12/   Right foot  . ROTATOR CUFF REPAIR  02/09/2015   right  . TUBAL LIGATION    . UPPER GI ENDOSCOPY        Past Medical History:  Diagnosis Date  . Allergy   . Anxiety   . Gastric ulcer   . GERD (gastroesophageal reflux disease)   . H/O measles   . H/O mumps   . Headache    history of migraines  . History of chicken pox   . Migraines   . Panic attacks 2005  . Shingles   . Vitamin D deficiency   . Wears partial dentures    upper partial      Review of Systems  Constitutional: Negative for chills and fever.  Respiratory: Negative for chest tightness, shortness of breath and wheezing.   Cardiovascular: Negative for chest pain, palpitations and leg swelling.  Psychiatric/Behavioral: Positive for decreased concentration, dysphoric mood and sleep disturbance. Negative for suicidal ideas. The patient is nervous/anxious.       Objective:  BP (!) 148/90 (BP Location: Left Arm, Patient Position: Sitting, Cuff Size: Large)   Pulse 72   Temp 98.1 F (36.7 C) (Oral)   Resp 16   Ht 5' 3.75" (1.619 m)   Wt 213 lb (96.6 kg)   LMP 08/30/2014 (Exact Date)   SpO2 98%   BMI 36.85 kg/m  Nursing note and vital signs reviewed.  Physical Exam  Constitutional: She is oriented to person, place, and time. She appears well-developed and well-nourished. No distress.  Cardiovascular: Normal rate, regular rhythm, normal heart sounds and intact distal pulses.   Pulmonary/Chest: Effort normal and breath sounds normal.  Neurological: She is alert and oriented to person, place,  and time.  Skin: Skin is warm and dry.  Psychiatric: She has a normal mood and affect. Her behavior is normal. Judgment and thought content normal.       Assessment & Plan:   Problem List Items Addressed This Visit      Other   Grief reaction with prolonged bereavement    Symptoms and exam consistent with grief reaction secondary to loss of to family members within the last 6 months. No suicidal ideations. Discussed importance of counseling and speaking with her pastor/preacher. Start Lexapro for depression. Start temazepam as needed for sleep. Follow-up in one month or sooner if needed.      Relevant Medications   escitalopram (LEXAPRO) 10 MG tablet   temazepam (RESTORIL) 15 MG capsule       I am having [name] start on escitalopram and temazepam. I am also having her maintain her Fish Oil, co-enzyme Q-10, ranitidine, and omeprazole.   Meds ordered this encounter  Medications  . escitalopram (LEXAPRO) 10 MG tablet    Sig: Take 1 tablet (10 mg total) by mouth daily.    Dispense:  30 tablet    Refill:  1    Order Specific Question:   Supervising Provider    Answer:   Pricilla Holm A [[id number]]  . temazepam (RESTORIL) 15 MG capsule    Sig: Take 1-2 capsules (15-30 mg total) by mouth at bedtime as needed for sleep.    Dispense:  60 capsule    Refill:  0    Order Specific Question:   Supervising Provider    Answer:   Pricilla Holm A [[id number]]     Follow-up: Return in about 1 month (around 07/22/2016), or if symptoms worsen or fail to improve.  Mauricio Po, FNP

## 2016-06-22 NOTE — Patient Instructions (Signed)
Thank you for choosing [facility].  SUMMARY AND INSTRUCTIONS:  Thoughts and prayers to you and your family.  Continue working with your [employer] the Lexapro daily and the temazepam as needed for sleep.  If thoughts of harming yourself, please seek emergency care.   Follow up in one month or sooner if needed.   Medication:  Your prescription(s) have been submitted to your pharmacy or been printed and provided for you. Please take as directed and contact our office if you believe you are having problem(s) with the medication(s) or have any questions.  Follow up:  If your symptoms worsen or fail to improve, please contact our office for further instruction, or in case of emergency go directly to the emergency room at the closest medical facility.     Complicated Grieving Grief is a normal response to the death of someone close to you. Feelings of fear, anger, and guilt can affect almost everyone who loses a loved one. It is also common to have symptoms of depression while you are grieving. These include problems with sleep, loss of appetite, and lack of energy. They may last for weeks or months after a loss. Complicated grief is different from normal grief or depression. Normal grieving involves sadness and feelings of loss, but these feelings are not constant. Complicated grief is a constant and severe type of grief. It interferes with your ability to function normally. It may last for several months to a year or longer. Complicated grief may require treatment from a mental health care provider. What are the causes? It is not known why some people continue to struggle with grief and others do not. You may be at higher risk for complicated grief if:  The death of your loved one was sudden or unexpected.  The death of your loved one was due to a violent event.  Your loved one committed suicide.  Your loved one was a child or a young person.  You were very close to or  dependent on the loved one.  You have a history of depression. What are the signs or symptoms? Signs and symptoms of complicated grief may include:  Feeling disbelief or numbness.  Being unable to enjoy good memories of your loved one.  Needing to avoid anything that reminds you of your loved one.  Being unable to stop thinking about the death.  Feeling intense anger or guilt.  Feeling alone and hopeless.  Feeling that your life is meaningless and empty.  Losing the desire to live. How is this diagnosed? Your health care provider may diagnose complicated grief if:  You have constant symptoms of grief for 6-12 months or longer.  Your symptoms are interfering with your ability to live your life. Your health care provider may want you to see a mental health care provider. Many symptoms of depression are similar to the symptoms of complicated grief. It is important to be evaluated for complicated grief along with other mental health conditions. How is this treated? Talk therapy with a mental health provider is the most common treatment for complicated grief. During therapy, you will learn healthy ways to cope with the loss of your loved one. In some cases, your mental health care provider may also recommend antidepressant medicines. Follow these instructions at home:  Take care of yourself.  Eat regular meals and maintain a healthy diet. Eat plenty of fruits, vegetables, and whole grains.  Try to get some exercise each day.  Keep regular hours for  sleep. Try to get at least 8 hours of sleep each night.  Do not use drugs or alcohol to ease your symptoms.  Take medicines only as directed by your health care provider.  Spend time with friends and loved ones.  Consider joining a grief (bereavement) support group to help you deal with your loss.  Keep all follow-up visits as directed by your health care provider. This is important. Contact a health care provider if:  Your  symptoms keep you from functioning normally.  Your symptoms do not get better with treatment. Get help right away if:  You have serious thoughts of hurting yourself or someone else.  You have suicidal feelings. This information is not intended to replace advice given to you by your health care provider. Make sure you discuss any questions you have with your health care provider. Document Released: 02/19/2005 Document Revised: 07/28/2015 Document Reviewed: 07/30/2013 Elsevier Interactive Patient Education  2017 [employer].

## 2016-08-06 ENCOUNTER — Ambulatory Visit (HOSPITAL_COMMUNITY)
Admission: EM | Admit: 2016-08-06 | Discharge: 2016-08-06 | Disposition: A | Discharge status: Home / Self Care | Payer: BC Managed Care – PPO | Attending: Emergency Medicine | Admitting: Emergency Medicine

## 2016-08-06 ENCOUNTER — Encounter (HOSPITAL_COMMUNITY): Payer: Self-pay | Admitting: Emergency Medicine

## 2016-08-06 DIAGNOSIS — J40 Bronchitis, not specified as acute or chronic: Secondary | ICD-10-CM | POA: Diagnosis not present

## 2016-08-06 MED ORDER — AZITHROMYCIN 250 MG PO TABS: 250.0000 mg | ORAL_TABLET | Freq: Every day | ORAL | 0 refills | Status: DC

## 2016-08-06 NOTE — Discharge Instructions (Signed)
See your Physician for recheck in 3-4 days if not improving °

## 2016-08-06 NOTE — ED Triage Notes (Signed)
The patient presented to the [facility] with a complaint of a cough and sore throat with congestion x 5 days.

## 2016-08-06 NOTE — ED Provider Notes (Signed)
CSN: [id number]     Arrival date & time 08/06/16  1811 History   None    Chief Complaint  Patient presents with  . Cough   (Consider location/radiation/quality/duration/timing/severity/associated sxs/prior Treatment) The history is provided by the patient. No language interpreter was used.  Cough  Cough characteristics:  Productive Sputum characteristics:  Nondescript Severity:  Moderate Onset quality:  Gradual Duration:  5 days Timing:  Constant Progression:  Worsening Chronicity:  New Smoker: no   Relieved by:  Nothing Worsened by:  Nothing Ineffective treatments:  None tried Associated symptoms: no shortness of breath   Pt complains coughing.  Pt reports she feels like she has bronchitis.    Past Medical History:  Diagnosis Date  . Allergy   . Anxiety   . Gastric ulcer   . GERD (gastroesophageal reflux disease)   . H/O measles   . H/O mumps   . Headache    history of migraines  . History of chicken pox   . Migraines   . Panic attacks 2005  . Shingles   . Vitamin D deficiency   . Wears partial dentures    upper partial   Past Surgical History:  Procedure Laterality Date  . APPENDECTOMY    . BUNIONECTOMY Right 05/01/12   [place], right foot  . BUNIONECTOMY Left   . CHOLECYSTECTOMY N/A 03/18/2013   Procedure: LAPAROSCOPIC CHOLECYSTECTOMY;  Surgeon: Harl Bowie, MD;  Location: [place];  Service: General;  Laterality: N/A;  . COLONOSCOPY    . DILATATION & CURRETTAGE/HYSTEROSCOPY WITH RESECTOCOPE N/A 09/07/2014   Procedure: DILATATION & CURETTAGE/HYSTEROSCOPY WITH RESECTOCOPE;  Surgeon: Everett Graff, MD;  Location: [facility];  Service: Gynecology;  Laterality: N/A;  . ENDOMETRIAL BIOPSY  Rotator  . Remove Implant Deep Right 05/01/12/   Right foot  . ROTATOR CUFF REPAIR  02/09/2015   right  . TUBAL LIGATION    . UPPER GI ENDOSCOPY     Family History  Problem Relation Age of Onset  . Hypertension Mother   . Diabetes Mother   . Heart disease  Sister   . Hypertension Sister   . Hypertension Brother   . Cancer Maternal Grandmother        female cancer ? type  . Colon cancer Maternal Grandfather   . Heart disease Maternal Aunt   . Heart attack Brother        x 3  . Prostate cancer Maternal Uncle   . Lung cancer Paternal Uncle   . Clotting disorder Brother    Social History  Substance Use Topics  . Smoking status: Never Smoker  . Smokeless tobacco: Never Used  . Alcohol use No   OB History    Gravida Para Term Preterm AB Living   2 1 1     1    SAB TAB Ectopic Multiple Live Births                 Review of Systems  Respiratory: Positive for cough. Negative for shortness of breath.   All other systems reviewed and are negative.   Allergies  Codeine and Asa [aspirin]  Home Medications   Prior to Admission medications   Medication Sig Start Date End Date Taking? Authorizing Provider  azithromycin (ZITHROMAX) 250 MG tablet Take 1 tablet (250 mg total) by mouth daily. Take first 2 tablets together, then 1 every day until finished. 08/06/16   Fransico Meadow, PA-C  co-enzyme Q-10 30 MG capsule Take 30 mg by mouth  3 (three) times daily.    [provider]  escitalopram (LEXAPRO) 10 MG tablet Take 1 tablet (10 mg total) by mouth daily. 06/22/16   Golden Circle, FNP  Omega-3 Fatty Acids (FISH OIL) 1000 MG CAPS Take 1,000 mg by mouth daily.    [provider]  omeprazole (PRILOSEC) 40 MG capsule Take 1 capsule (40 mg total) by mouth daily. 06/19/16   Zehr, Laban Emperor, PA-C  ranitidine (ZANTAC) 150 MG tablet Take 1 tablet (150 mg total) by mouth 2 (two) times daily as needed for heartburn. 06/19/16   Zehr, Janett Billow D, PA-C  temazepam (RESTORIL) 15 MG capsule Take 1-2 capsules (15-30 mg total) by mouth at bedtime as needed for sleep. 06/22/16   Golden Circle, FNP   Meds Ordered and Administered this Visit  Medications - No data to display  BP (!) 176/88 (BP Location: Right Arm)   Pulse 98   Temp 98.7 F  (37.1 C) (Oral)   Resp 20   LMP 08/30/2014 (Exact Date)   SpO2 98%  No data found.   Physical Exam  Constitutional: She appears well-developed and well-nourished. No distress.  HENT:  Head: Normocephalic and atraumatic.  Eyes: Conjunctivae are normal.  Neck: Neck supple.  Cardiovascular: Normal rate and regular rhythm.   No murmur heard. Pulmonary/Chest: Effort normal and breath sounds normal. No respiratory distress.  Abdominal: Soft. There is no tenderness.  Musculoskeletal: She exhibits no edema.  Neurological: She is alert.  Skin: Skin is warm and dry.  Psychiatric: She has a normal mood and affect.  Nursing note and vitals reviewed.   Urgent Care Course     Procedures (including critical care time)  Labs Review Labs Reviewed - No data to display  Imaging Review No results found.   Visual Acuity Review  Right Eye Distance:   Left Eye Distance:   Bilateral Distance:    Right Eye Near:   Left Eye Near:    Bilateral Near:         MDM Pt request an injection of antibiotics.  I will give her antibiotics.  I doubt pneumonia.  I don't think antibiotic injection would be of any benefit.    1. Bronchitis    An After Visit Summary was printed and given to the patient. Meds ordered this encounter  Medications  . DISCONTD: azithromycin (ZITHROMAX) 250 MG tablet    Sig: Take 1 tablet (250 mg total) by mouth daily. Take first 2 tablets together, then 1 every day until finished.    Dispense:  6 tablet    Refill:  0    Order Specific Question:   Supervising Provider    Answer:   Melynda Ripple [[id number]]  . azithromycin (ZITHROMAX) 250 MG tablet    Sig: Take 1 tablet (250 mg total) by mouth daily. Take first 2 tablets together, then 1 every day until finished.    Dispense:  6 tablet    Refill:  0    Order Specific Question:   Supervising Provider    Answer:   Melynda Ripple [[id number]]       Fransico Meadow, PA-C 08/06/16 2150

## 2016-10-03 DIAGNOSIS — J40 Bronchitis, not specified as acute or chronic: Secondary | ICD-10-CM

## 2016-10-03 HISTORY — DX: Bronchitis, not specified as acute or chronic: J40

## 2016-12-13 ENCOUNTER — Other Ambulatory Visit: Payer: Self-pay

## 2016-12-13 DIAGNOSIS — F4381 Prolonged grief disorder: Secondary | ICD-10-CM

## 2016-12-13 DIAGNOSIS — F4329 Adjustment disorder with other symptoms: Secondary | ICD-10-CM

## 2016-12-13 DIAGNOSIS — F4321 Adjustment disorder with depressed mood: Secondary | ICD-10-CM

## 2016-12-13 MED ORDER — TEMAZEPAM 15 MG PO CAPS: 15.0000 mg | ORAL_CAPSULE | Freq: Every evening | ORAL | 0 refills | Status: DC | PRN

## 2016-12-13 NOTE — Progress Notes (Signed)
Medication refilled. Will need follow up for additional refills.

## 2017-01-15 ENCOUNTER — Telehealth (HOSPITAL_COMMUNITY): Payer: Self-pay | Admitting: Emergency Medicine

## 2017-01-15 ENCOUNTER — Encounter (HOSPITAL_COMMUNITY): Payer: Self-pay | Admitting: Family Medicine

## 2017-01-15 ENCOUNTER — Ambulatory Visit (HOSPITAL_COMMUNITY)
Admission: EM | Admit: 2017-01-15 | Discharge: 2017-01-15 | Disposition: A | Discharge status: Home / Self Care | Payer: BC Managed Care – PPO | Attending: Family Medicine | Admitting: Family Medicine

## 2017-01-15 DIAGNOSIS — J40 Bronchitis, not specified as acute or chronic: Secondary | ICD-10-CM | POA: Diagnosis not present

## 2017-01-15 MED ORDER — ALBUTEROL SULFATE HFA 108 (90 BASE) MCG/ACT IN AERS: 2.0000 | INHALATION_SPRAY | RESPIRATORY_TRACT | 1 refills | Status: DC | PRN

## 2017-01-15 MED ORDER — SPACER/AERO-HOLDING CHAMBERS DEVI: 1.0000 | Freq: Four times a day (QID) | 1 refills | Status: DC | PRN

## 2017-01-15 MED ORDER — HYDROCODONE-HOMATROPINE 5-1.5 MG/5ML PO SYRP: 5.0000 mL | ORAL_SOLUTION | Freq: Four times a day (QID) | ORAL | 0 refills | Status: DC | PRN

## 2017-01-15 NOTE — ED Triage Notes (Signed)
Pt sts cough x several weeks worse at night; no fever

## 2017-01-15 NOTE — Telephone Encounter (Signed)
Pt requests for meds to be sent to her [facility])  Pharmacy  Sent to pharm per her request.

## 2017-01-15 NOTE — ED Provider Notes (Signed)
[place]   [id number] 01/15/17 Arrival Time: 2595   SUBJECTIVE:  Kelly Abbott is a 61 y.o. female who presents to the urgent care with complaint of cough and chest discomfort.  It began 5 days ago and is getting worse.  No fever or asthma hx.  No dyspnea.  Works as Glass blower/designer.    Past Medical History:  Diagnosis Date  . Allergy   . Anxiety   . Gastric ulcer   . GERD (gastroesophageal reflux disease)   . H/O measles   . H/O mumps   . Headache    history of migraines  . History of chicken pox   . Migraines   . Panic attacks 2005  . Shingles   . Vitamin D deficiency   . Wears partial dentures    upper partial   Family History  Problem Relation Age of Onset  . Hypertension Mother   . Diabetes Mother   . Heart disease Sister   . Hypertension Sister   . Hypertension Brother   . Cancer Maternal Grandmother        female cancer ? type  . Colon cancer Maternal Grandfather   . Heart disease Maternal Aunt   . Heart attack Brother        x 3  . Prostate cancer Maternal Uncle   . Lung cancer Paternal Uncle   . Clotting disorder Brother    Social History   Socioeconomic History  . Marital status: Married    Spouse name: Not on file  . Number of children: 1  . Years of education: 90  . Highest education level: Not on file  Social Needs  . Financial resource strain: Not on file  . Food insecurity - worry: Not on file  . Food insecurity - inability: Not on file  . Transportation needs - medical: Not on file  . Transportation needs - non-medical: Not on file  Occupational History  . Occupation: Agricultural engineer    Comment: Personnel officer  Tobacco Use  . Smoking status: Never Smoker  . Smokeless tobacco: Never Used  Substance and Sexual Activity  . Alcohol use: No  . Drug use: No  . Sexual activity: Yes    Birth control/protection: None  Other Topics Concern  . Not on file  Social History Narrative   Fun: Bowling,  shopping, workout   Denies religious beliefs effecting healthcare   Denies abuse and feels safe at home.    No outpatient medications have been marked as taking for the 01/15/17 encounter [facility] Encounter).   Allergies  Allergen Reactions  . Codeine Nausea Only  . Asa [Aspirin] Hives      ROS: As per HPI, remainder of ROS negative.   OBJECTIVE:   Vitals:   01/15/17 1827  BP: (!) 142/89  Pulse: 84  Resp: 18  Temp: 98.1 F (36.7 C)  TempSrc: Oral  SpO2: 98%     General appearance: alert; no distress Eyes: PERRL; EOMI; conjunctiva normal HENT: normocephalic; atraumatic;  external ears normal without trauma; nasal mucosa normal; oral mucosa normal Neck: supple Lungs: clear to auscultation bilaterally Heart: regular rate and rhythm Back: no CVA tenderness Extremities: no cyanosis or edema; symmetrical with no gross deformities Skin: warm and dry Neurologic: normal gait; grossly normal Psychological: alert and cooperative; normal mood and affect      Labs:  Results for orders placed or performed in visit on 06/20/16  CBC With Differential  Result Value Ref Range  WBC 8.6 3.4 - 10.8 x10E3/uL   RBC 4.80 3.77 - 5.28 x10E6/uL   Hemoglobin 13.1 11.1 - 15.9 g/dL   Hematocrit 39.6 34.0 - 46.6 %   MCV 83 79 - 97 fL   MCH 27.3 26.6 - 33.0 pg   MCHC 33.1 31.5 - 35.7 g/dL   RDW 16.0 (H) 12.3 - 15.4 %   Neutrophils 63 Not Estab. %   Lymphs 30 Not Estab. %   Monocytes 5 Not Estab. %   Eos 2 Not Estab. %   Basos 0 Not Estab. %   Neutrophils Absolute 5.4 1.4 - 7.0 x10E3/uL   Lymphocytes Absolute 2.6 0.7 - 3.1 x10E3/uL   Monocytes Absolute 0.5 0.1 - 0.9 x10E3/uL   EOS (ABSOLUTE) 0.1 0.0 - 0.4 x10E3/uL   Basophils Absolute 0.0 0.0 - 0.2 x10E3/uL   Immature Granulocytes 0 Not Estab. %   Immature Grans (Abs) 0.0 0.0 - 0.1 x10E3/uL  COMPLETE METABOLIC PANEL WITH GFR  Result Value Ref Range   Sodium 139 135 - 146 mmol/L   Potassium 5.1 3.5 - 5.3 mmol/L   Chloride  104 98 - 110 mmol/L   CO2 28 20 - 31 mmol/L   Glucose, Bld 92 65 - 99 mg/dL   BUN 10 7 - 25 mg/dL   Creat 0.76 0.50 - 0.99 mg/dL   Total Bilirubin 0.4 0.2 - 1.2 mg/dL   Alkaline Phosphatase 80 33 - 130 U/L   AST 13 10 - 35 U/L   ALT 11 6 - 29 U/L   Total Protein 6.7 6.1 - 8.1 g/dL   Albumin 3.9 3.6 - 5.1 g/dL   Calcium 9.6 8.6 - 10.4 mg/dL   GFR, Est African American >89 >=60 mL/min   GFR, Est Non African American 85 >=60 mL/min  Lipase  Result Value Ref Range   Lipase 21.0 11.0 - 59.0 U/L    Labs Reviewed - No data to display  No results found.     ASSESSMENT & PLAN:  1. Bronchitis     Meds ordered this encounter  Medications  . HYDROcodone-homatropine (HYDROMET) 5-1.5 MG/5ML syrup    Sig: Take 5 mLs every 6 (six) hours as needed by mouth for cough.    Dispense:  90 mL    Refill:  0  . albuterol (PROVENTIL HFA;VENTOLIN HFA) 108 (90 Base) MCG/ACT inhaler    Sig: Inhale 2 puffs every 4 (four) hours as needed into the lungs for wheezing or shortness of breath (cough, shortness of breath or wheezing.).    Dispense:  1 Inhaler    Refill:  1  . Spacer/Aero-Holding Chambers DEVI    Sig: 1 Device every 6 (six) hours as needed by Does not apply route.    Dispense:  1 each    Refill:  1    Reviewed expectations re: course of current medical issues. Questions answered. Outlined signs and symptoms indicating need for more acute intervention. Patient verbalized understanding. After Visit Summary given.    Procedures:      Robyn Haber, MD 01/15/17 [phone]

## 2017-01-15 NOTE — Discharge Instructions (Signed)
If you develop shortness of breath or fever over 100, please return for x=ray

## 2017-01-22 ENCOUNTER — Other Ambulatory Visit: Payer: Self-pay | Admitting: Obstetrics and Gynecology

## 2017-02-11 ENCOUNTER — Other Ambulatory Visit: Payer: Self-pay

## 2017-02-11 ENCOUNTER — Inpatient Hospital Stay (HOSPITAL_COMMUNITY)
Admission: AD | Admit: 2017-02-11 | Discharge: 2017-02-11 | Disposition: A | Discharge status: Home / Self Care | Payer: BC Managed Care – PPO | Source: Ambulatory Visit | Attending: Obstetrics and Gynecology | Admitting: Obstetrics and Gynecology

## 2017-02-11 ENCOUNTER — Encounter (HOSPITAL_COMMUNITY): Payer: Self-pay | Admitting: *Deleted

## 2017-02-11 DIAGNOSIS — Z886 Allergy status to analgesic agent status: Secondary | ICD-10-CM | POA: Diagnosis not present

## 2017-02-11 DIAGNOSIS — R102 Pelvic and perineal pain: Secondary | ICD-10-CM | POA: Diagnosis not present

## 2017-02-11 DIAGNOSIS — N939 Abnormal uterine and vaginal bleeding, unspecified: Secondary | ICD-10-CM

## 2017-02-11 LAB — URINALYSIS, ROUTINE W REFLEX MICROSCOPIC
BACTERIA UA: NONE SEEN
BILIRUBIN URINE: NEGATIVE
Glucose, UA: NEGATIVE mg/dL
KETONES UR: NEGATIVE mg/dL
LEUKOCYTES UA: NEGATIVE
Nitrite: NEGATIVE
Protein, ur: NEGATIVE mg/dL
Specific Gravity, Urine: 1.017 (ref 1.005–1.030)
pH: 6 (ref 5.0–8.0)

## 2017-02-11 LAB — CBC
HCT: 39.4 % (ref 36.0–46.0)
Hemoglobin: 12.7 g/dL (ref 12.0–15.0)
MCH: 28.1 pg (ref 26.0–34.0)
MCHC: 32.2 g/dL (ref 30.0–36.0)
MCV: 87.2 fL (ref 78.0–100.0)
PLATELETS: 458 10*3/uL — AB (ref 150–400)
RBC: 4.52 MIL/uL (ref 3.87–5.11)
RDW: 16.4 % — ABNORMAL HIGH (ref 11.5–15.5)
WBC: 11.5 10*3/uL — AB (ref 4.0–10.5)

## 2017-02-11 MED ORDER — HYDROCODONE-ACETAMINOPHEN 5-325 MG PO TABS: 1.0000 | ORAL_TABLET | ORAL | 0 refills | Status: DC | PRN

## 2017-02-11 MED ORDER — MEDROXYPROGESTERONE ACETATE 10 MG PO TABS: 10.0000 mg | ORAL_TABLET | Freq: Every day | ORAL | 0 refills | Status: DC

## 2017-02-11 MED ORDER — HYDROCODONE-ACETAMINOPHEN 5-325 MG PO TABS
2.0000 | ORAL_TABLET | Freq: Once | ORAL | Status: AC
Start: 2017-02-11 — End: 2017-02-11
  Administered 2017-02-11: 2 via ORAL
  Filled 2017-02-11: qty 2

## 2017-02-11 NOTE — MAU Note (Signed)
Had a D&C last Friday.  Has been having heavy bleeding, changing 5 times a day.  Having sever abd pain.  Started back on birth control pills, 2 a day.

## 2017-02-11 NOTE — Discharge Instructions (Signed)
Abnormal Uterine Bleeding Abnormal uterine bleeding can affect women at various stages in life, including teenagers, women in their reproductive years, pregnant women, and women who have reached menopause. Several kinds of uterine bleeding are considered abnormal, including:  Bleeding or spotting between periods.  Bleeding after sexual intercourse.  Bleeding that is heavier or more than normal.  Periods that last longer than usual.  Bleeding after menopause. Many cases of abnormal uterine bleeding are minor and simple to treat, while others are more serious. Any type of abnormal bleeding should be evaluated by your health care provider. Treatment will depend on the cause of the bleeding. Follow these instructions at home: Monitor your condition for any changes. The following actions may help to alleviate any discomfort you are experiencing:  Avoid the use of tampons and douches as directed by your health care provider.  Change your pads frequently. You should get regular pelvic exams and Pap tests. Keep all follow-up appointments for diagnostic tests as directed by your health care provider. Contact a health care provider if:  Your bleeding lasts more than 1 week.  You feel dizzy at times. Get help right away if:  You pass out.  You are changing pads every 15 to 30 minutes.  You have abdominal pain.  You have a fever.  You become sweaty or weak.  You are passing large blood clots from the vagina.  You start to feel nauseous and vomit. This information is not intended to replace advice given to you by your health care provider. Make sure you discuss any questions you have with your health care provider. Document Released: 02/19/2005 Document Revised: 08/03/2015 Document Reviewed: 09/18/2012 Elsevier Interactive Patient Education  2017 [employer]  

## 2017-02-11 NOTE — MAU Provider Note (Signed)
History     CSN: [id number]  Arrival date and time: 02/11/17 1235   None     Chief Complaint  Patient presents with  . Vaginal Bleeding  . Abdominal Pain   HPI 61yo patient of Dr Everett Graff with h/o AUB that started in November. Was seen in office, had negative EmBx, had hysteroscopy with D&C on 12/7. She began to have increasing pelvic pain and cramping with bleeding on Saturday. She called the outpatient office and was instructed to go back on her norethindrone 5mg  BID. Bleeding has slowed, but still has bleeding and changing saturated pads 4-5 times a day. She came here due to pain, which is non-radiating, severe and cramping in nature. Relieved temporarily with ibuprofen 800mg , which she is taking TID.  OB History    Gravida Para Term Preterm AB Living   2 1 1     1    SAB TAB Ectopic Multiple Live Births           1      Past Medical History:  Diagnosis Date  . Allergy   . Anxiety   . Gastric ulcer   . GERD (gastroesophageal reflux disease)   . H/O measles   . H/O mumps   . Headache    history of migraines  . History of chicken pox   . Migraines   . Panic attacks 2005  . Shingles   . Vitamin D deficiency   . Wears partial dentures    upper partial    Past Surgical History:  Procedure Laterality Date  . APPENDECTOMY    . BUNIONECTOMY Right 05/01/12   [place], right foot  . BUNIONECTOMY Left   . CHOLECYSTECTOMY N/A 03/18/2013   Procedure: LAPAROSCOPIC CHOLECYSTECTOMY;  Surgeon: Harl Bowie, MD;  Location: [place];  Service: General;  Laterality: N/A;  . COLONOSCOPY    . DILATATION & CURRETTAGE/HYSTEROSCOPY WITH RESECTOCOPE N/A 09/07/2014   Procedure: DILATATION & CURETTAGE/HYSTEROSCOPY WITH RESECTOCOPE;  Surgeon: Everett Graff, MD;  Location: [facility];  Service: Gynecology;  Laterality: N/A;  . ENDOMETRIAL BIOPSY  Rotator  . Remove Implant Deep Right 05/01/12/   Right foot  . ROTATOR CUFF REPAIR  02/09/2015   right  . TUBAL LIGATION     . UPPER GI ENDOSCOPY      Family History  Problem Relation Age of Onset  . Hypertension Mother   . Diabetes Mother   . Heart disease Sister   . Hypertension Sister   . Hypertension Brother   . Cancer Maternal Grandmother        female cancer ? type  . Colon cancer Maternal Grandfather   . Heart disease Maternal Aunt   . Heart attack Brother        x 3  . Prostate cancer Maternal Uncle   . Lung cancer Paternal Uncle   . Clotting disorder Brother     Social History   Tobacco Use  . Smoking status: Never Smoker  . Smokeless tobacco: Never Used  Substance Use Topics  . Alcohol use: No  . Drug use: No    Allergies:  Allergies  Allergen Reactions  . Codeine Nausea Only  . Asa [Aspirin] Hives    Medications Prior to Admission  Medication Sig Dispense Refill Last Dose  . albuterol (PROVENTIL HFA;VENTOLIN HFA) 108 (90 Base) MCG/ACT inhaler Inhale 2 puffs every 4 (four) hours as needed into the lungs for wheezing or shortness of breath (cough, shortness of breath or wheezing.). 1  Inhaler 1   . co-enzyme Q-10 30 MG capsule Take 30 mg by mouth 3 (three) times daily.   Taking  . escitalopram (LEXAPRO) 10 MG tablet Take 1 tablet (10 mg total) by mouth daily. 30 tablet 1   . HYDROcodone-homatropine (HYDROMET) 5-1.5 MG/5ML syrup Take 5 mLs every 6 (six) hours as needed by mouth for cough. 90 mL 0   . Omega-3 Fatty Acids (FISH OIL) 1000 MG CAPS Take 1,000 mg by mouth daily.   Taking  . omeprazole (PRILOSEC) 40 MG capsule Take 1 capsule (40 mg total) by mouth daily. 30 capsule 5 Taking  . ranitidine (ZANTAC) 150 MG tablet Take 1 tablet (150 mg total) by mouth 2 (two) times daily as needed for heartburn. 30 tablet 2 Taking  . Spacer/Aero-Holding Chambers DEVI 1 Device every 6 (six) hours as needed by Does not apply route. 1 each 1   . temazepam (RESTORIL) 15 MG capsule Take 1-2 capsules (15-30 mg total) by mouth at bedtime as needed for sleep. 60 capsule 0     Review of  Systems Physical Exam   Blood pressure 135/65, pulse 76, temperature 97.7 F (36.5 C), temperature source Oral, resp. rate 18, weight 217 lb 4 oz (98.5 kg), last menstrual period 08/30/2014, SpO2 99 %.  Physical Exam  Constitutional: She is oriented to person, place, and time. She appears well-developed and well-nourished.  HENT:  Head: Normocephalic and atraumatic.  Right Ear: External ear normal.  Left Ear: External ear normal.  Cardiovascular: Normal rate, regular rhythm and normal heart sounds.  Respiratory: Effort normal and breath sounds normal. No respiratory distress. She has no wheezes. She has no rales.  GI: Soft. Bowel sounds are normal. She exhibits no distension. There is no tenderness. There is no rebound and no guarding. Hernia confirmed negative in the right inguinal area.    Genitourinary: There is no rash, tenderness or lesion on the right labia. There is no rash, tenderness or lesion on the left labia. Cervix exhibits no motion tenderness and no discharge. Right adnexum displays no mass, no tenderness and no fullness. Left adnexum displays no mass, no tenderness and no fullness. There is bleeding in the vagina. No tenderness in the vagina. No foreign body in the vagina. No vaginal discharge found.  Genitourinary Comments: Due to patient's body habitus, difficult to assess fundal tenderness  Neurological: She is alert and oriented to person, place, and time.  Skin: Skin is warm and dry.  Psychiatric: She has a normal mood and affect. Her behavior is normal. Judgment and thought content normal.    MAU Course  Procedures  MDM [name] pain improved. I don't see any purulent discharge. White count a little elevated. Discussed pt with Dr Alwyn Pea. She advised the patient be changed to provera 10mg  daily and to have pt follow up with Dr Mancel Bale in the office.   Assessment and Plan  1. Pelvic pain and bleeding after procedure Vicodin 5/325mg  1q4hr #12 given Provera 10mg   daily Follow up with Dr Mancel Bale. Pt stable for d/c to home.  Truett Mainland 02/11/2017, 1:40 PM

## 2017-02-12 ENCOUNTER — Other Ambulatory Visit: Payer: Self-pay | Admitting: Obstetrics and Gynecology

## 2017-03-11 ENCOUNTER — Telehealth: Payer: Self-pay | Admitting: Internal Medicine

## 2017-03-11 NOTE — Progress Notes (Signed)
Subjective:    Patient ID: Kelly Abbott, female    DOB: 1955-10-29, 62 y.o.   MRN: [id number]  HPI She is here for an acute visit for abnormal blood work results  Her gyn did blood work and there were abnormalities so she was advised to follow up with [place].    She has been bleeding with her menstraul cycle since just before thanksgivng.  She was tried on birth control, had an IUD placed and had an ablation and none were successful in stopping her bleeding.  She does spoke with her gynecologist this morning and she is scheduled for a complete hysterectomy this Sunday, 03/17/17.  In addition to the continuous bleeding she is experiencing a lot of abdominal cramping and pain.  She tried taking Percocet, but that was too strong for her.  Advil is only helping a little bit.  She was prescribed Vicodin and that does help.  Her CBC in September was normal including her white blood cell count, hemoglobin and platelet count.  She has had several CBCs since then.  On November 16/2018 white blood cell count and hemoglobin were both normal and her platelet count was 405.  On 02/15/17 white blood cell count was 11.4, hemoglobin normal and platelet count 480.  On 02/19/17 her white blood cell count was 12.1, hemoglobin normal and patent platelet count 452.  On 03/07/17 her white blood cell count was 12.5, hemoglobin/hematocrit normal and platelet count 552.  She does stay fatigue.She has had some shortness of breath with activity.  She denies any real headaches or lightheadedness on a regular basis.  She denies chest pain, palpitations and leg swelling.  She is havinga lot of abdominal craming and pain.   Her hgb is low.  Her gyn advised her to follow up .   GERD:  She is taking her medication daily as prescribed.  She denies any GERD symptoms and feels her GERD is well controlled.    Medications and allergies reviewed with patient and updated if appropriate.  Patient Active Problem List   Diagnosis Date Noted  . 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 Gastroesophageal reflux disease with esophagitis 06/20/2016  . Abdominal pain, epigastric 06/20/2016  . Nausea without vomiting 06/20/2016  . Right shoulder pain 12/24/2014  . Elevated platelet count 11/17/2014  . RLQ abdominal pain 10/21/2014  . BMI 35.0-35.9,adult 09/02/2012  . Status post foot joint surgery 06/24/2012  . 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 Vitamin D deficiency   . Panic attacks     Current Outpatient Medications on File Prior to Visit  Medication Sig Dispense Refill  . albuterol (PROVENTIL HFA;VENTOLIN HFA) 108 (90 Base) MCG/ACT inhaler Inhale 2 puffs every 4 (four) hours as needed into the lungs for wheezing or shortness of breath (cough, shortness of breath or wheezing.). 1 Inhaler 1  . Hydrocodone-Acetaminophen 5-300 MG TABS Take 1 tablet by mouth every 4 (four) hours as needed.    Marland Kitchen ibuprofen (ADVIL,MOTRIN) 800 MG tablet ibuprofen 800 mg tablet  1 po pc every 8 hours x 2 days then prn-pain    . Omega-3 Fatty Acids (FISH OIL) 1000 MG CAPS Take 1,000 mg by mouth daily.    . ranitidine (ZANTAC) 150 MG tablet Take 1 tablet (150 mg total) by mouth 2 (two) times daily as needed for heartburn. 30 tablet 2   No current facility-administered medications on file prior to visit.     Past Medical History:  Diagnosis Date  . Allergy   .  Anxiety   . Gastric ulcer   . GERD (gastroesophageal reflux disease)   . H/O measles   . H/O mumps   . Headache    history of migraines  . History of chicken pox   . Migraines   . Panic attacks 2005  . Shingles   . Vitamin D deficiency   . Wears partial dentures    upper partial    Past Surgical History:  Procedure Laterality Date  . APPENDECTOMY    . BUNIONECTOMY Right 05/01/12   [place], right foot  . BUNIONECTOMY Left   . CHOLECYSTECTOMY N/A 03/18/2013   Procedure: LAPAROSCOPIC CHOLECYSTECTOMY;  Surgeon: Harl Bowie, MD;  Location:  [facility];  Service: General;  Laterality: N/A;  . COLONOSCOPY    . DILATATION & CURRETTAGE/HYSTEROSCOPY WITH RESECTOCOPE N/A 09/07/2014   Procedure: DILATATION & CURETTAGE/HYSTEROSCOPY WITH RESECTOCOPE;  Surgeon: Everett Graff, MD;  Location: [facility];  Service: Gynecology;  Laterality: N/A;  . ENDOMETRIAL BIOPSY  Rotator  . Remove Implant Deep Right 05/01/12/   Right foot  . ROTATOR CUFF REPAIR  02/09/2015   right  . TUBAL LIGATION    . UPPER GI ENDOSCOPY      Social History   Socioeconomic History  . Marital status: Married    Spouse name: None  . Number of children: 1  . Years of education: 68  . Highest education level: None  Social Needs  . Financial resource strain: None  . Food insecurity - worry: None  . Food insecurity - inability: None  . Transportation needs - medical: None  . Transportation needs - non-medical: None  Occupational History  . Occupation: Agricultural engineer    Comment: Personnel officer  Tobacco Use  . Smoking status: Never Smoker  . Smokeless tobacco: Never Used  Substance and Sexual Activity  . Alcohol use: No  . Drug use: No  . Sexual activity: Yes    Birth control/protection: None  Other Topics Concern  . None  Social History Narrative   Fun: Bowling, shopping, workout   Denies religious beliefs effecting healthcare   Denies abuse and feels safe at home.     Family History  Problem Relation Age of Onset  . Hypertension Mother   . Diabetes Mother   . Heart disease Sister   . Hypertension Sister   . Hypertension Brother   . Cancer Maternal Grandmother        female cancer ? type  . Colon cancer Maternal Grandfather   . Heart disease Maternal Aunt   . Heart attack Brother        x 3  . Prostate cancer Maternal Uncle   . Lung cancer Paternal Uncle   . Clotting disorder Brother     Review of Systems  Constitutional: Positive for fatigue.  Respiratory: Positive for shortness of breath. Negative for cough  and wheezing.   Cardiovascular: Negative for chest pain, palpitations and leg swelling.  Gastrointestinal: Positive for abdominal pain.  Genitourinary: Positive for vaginal bleeding.  Neurological: Negative for light-headedness and headaches.       Objective:   Vitals:   03/12/17 1047  BP: 132/80  Pulse: 69  Resp: 16  Temp: 98.2 F (36.8 C)  SpO2: 98%   Wt Readings from Last 3 Encounters:  03/12/17 216 lb (98 kg)  02/11/17 217 lb 4 oz (98.5 kg)  06/22/16 213 lb (96.6 kg)   Body mass index is 37.37 kg/m.   Physical Exam  Constitutional:  She appears well-developed and well-nourished. No distress.  Skin: Skin is warm. She is not diaphoretic.  Psychiatric: She has a normal mood and affect. Her behavior is normal.            Assessment & Plan:   15 minutes were spent face-to-face with the patient, over 50% of which was spent counseling regarding her abnormal blood counts including possible causes.  She was reassured.  We discussed evaluation in the near future if these abnormalities persist   See Problem List for Assessment and Plan of chronic medical problems.

## 2017-03-11 NOTE — Telephone Encounter (Signed)
CRM for notification. See Telephone encounter for:   03/11/17.   Relation to pt: self  Call back number: [phone]   Reason for call:  Patient was advised to follow up with PCP office to discuss CBC and [facility] taken with Everett Graff [facility] [place] [facility] [phone]. Patient was concerned and wanted to discuss as soon as possible, patient contacting obgyn requesting results fax over to [phone].

## 2017-03-12 ENCOUNTER — Ambulatory Visit (INDEPENDENT_AMBULATORY_CARE_PROVIDER_SITE_OTHER): Payer: BC Managed Care – PPO | Admitting: Internal Medicine

## 2017-03-12 ENCOUNTER — Encounter: Payer: Self-pay | Admitting: Internal Medicine

## 2017-03-12 VITALS — BP 132/80 | HR 69 | Temp 98.2°F | Resp 16 | Wt 216.0 lb

## 2017-03-12 DIAGNOSIS — R7989 Other specified abnormal findings of blood chemistry: Secondary | ICD-10-CM

## 2017-03-12 DIAGNOSIS — K2100 Gastro-esophageal reflux disease with esophagitis, without bleeding: Secondary | ICD-10-CM

## 2017-03-12 DIAGNOSIS — N921 Excessive and frequent menstruation with irregular cycle: Secondary | ICD-10-CM | POA: Diagnosis not present

## 2017-03-12 MED ORDER — RANITIDINE HCL 150 MG PO TABS: 150.0000 mg | ORAL_TABLET | Freq: Two times a day (BID) | ORAL | 2 refills | Status: DC | PRN

## 2017-03-12 NOTE — Assessment & Plan Note (Signed)
Here today at the request of GYN for elevated white blood cell count and platelet count over the past month.  Hemoglobin, hematocrit normal Elevation in white blood cell count and platelet count minimal and likely acute phase reactants Given that she is having a hysterectomy in 5 days I would not pursue further testing or evaluation at this time After she has surgery will need repeat blood work and follow-up and establish with a PCP-if abnormalities persist, which I do not think they will then we can pursue further evaluation

## 2017-03-12 NOTE — Patient Instructions (Addendum)
Take the miralax daily for your constipation. Take the pain medication as needed.     Follow up after surgery.

## 2017-03-12 NOTE — Assessment & Plan Note (Signed)
GERD controlled Continue daily medication Med refilled

## 2017-03-12 NOTE — Assessment & Plan Note (Signed)
Started Thanksgiving 2018 Associated with abdominal pain and cramping following with her gynecologist, Dr. Everett Graff Conservative measures have not been effective in stopping her persistent menstrual bleeding since Thanksgiving including oral birth control, IUD and ablation Scheduled for a hysterectomy in 5 days

## 2017-03-13 NOTE — H&P (Signed)
Kelly Abbott is a 62 y.o.  female, P: 1-0-1-1 who presents for hysterectomy because of post menopausal bleeding, pelvic pain and adenomyosis.  The patient had been amenorrheic since 2016 until November 2018 when she presented with vaginal bleeding that was so heavy she had to wear a Depends- a pad was not sufficient.  Even with the  Depends she would need to change her protection 3-4 times a day.  Along with this bleeding, there were severe cramps rated 7/10 on a 10 point pain scale that would not respond to Ibuprofen 800 mg.  A pelvic ultrasound at that time revealed:   an anteverted uterus: appearance of adenomyosis: 5.34 x 3.96 cm [8.5 cm from fundus to external os] endometrium: 10.54 mm, dynamic mobile simple fluid with no focal masses; left fundal intramural fibroid: 1.53 cm; right ovary: 2.31 cm and left ovary: 2.35 cm.  An endometrial biopsy  that followed returned benign endometrium with no malignancy or hyperplasia. Gonorrhea and Chlamydia cultures were negative and Thyroid test and CBC were normal  (elevated wbc was deemed by PCP to be related to medication and physical condition at that time).  The patient was given norethindrone 10 mg a day for her bleeding that decreased flow to using a regular pad 4 tims a day.  Her cramping, however, remained severe and only responded to Ibuprofen 800 mg with Vicodin.  She subsequently underwent a hysteroscopy with D & C in December 2018 however, her bleeding persisted.  A Mirena IUD was placed, with no change in bleeding nor pelvic pain, thus was subsequently removed.  Lastly she underwent an endometrial ablation with only 1 day of relief from bleeding then the process resumed. Following the ablation the patient required #2 Vicodin for relief of her pain.  She goes on to deny any changes in bladder function or history of  dyspareunia but admits to constipation (attributed to Vicodin) and lower back pain.  Given the patient's failure to improve her symptoms  with medical and minimally invasive surgical procedures she had decided to proceed with definitive therapy in the form of hysterectomy.   Past Medical History  OB History: G:2;  P: 1-0-1-1;  SVB: 1981  [ 6 lbs. 8 oz ]  GYN History: menarche: 62 YO    LMP: see HPI    Contracepton menopausal  The patient denies history of sexually transmitted disease.  Denies history of abnormal PAP smear.   Last PAP smear: 11/2016-normal with negative HPV  Medical History: Panic Disorder, GERD,  Peptic Ulcer Disease, Shingles, Vitamin D Deficiency, Bronchitis  and Migraine  Surgical History: 2018 Hysteroscopy D & C & Endometrial Ablation;    2016 D & C and Right Rotator Cuff Surgery;   2015 Cholecystectomy;  2014 Bilateral Bunionectomy;  2013 Skin Tag Removal,  1993 Tubal Sterilization and 1974 Appendectomy Denies problems with anesthesia or history of blood transfusions  Family History: Heart Disease, Hypertension, Diabetes Mellitus, Cancer (colon, lung and rectal) and Coagulopathy  Social History: Married  and employed in [place]       Denies tobacco or alcohol use   Medications: Vicodin 5/325 mg  every 4-6 hours prn-pain Ibuprofen 800 mg every 8 hours, pc prn-pain Pro Air HFA 90 mcg/actuation 2 puffs every 4-6 hours prn Ranitidine 150 mg  bid   Allergies  Allergen Reactions  . Codeine Nausea Only  . Asa [Aspirin] Hives  [per patient, able to take  aspirin with no reaction] Percocet causes nausea but is able to take  Vicodin   Denies sensitivity to peanuts, shellfish, soy, latex or adhesives.   ROS: Admits to reading glasses, removable upper dental plate but  denies headache, vision changes, nasal congestion, dysphagia, tinnitus, dizziness, hoarseness, cough,  chest pain, shortness of breath, nausea, vomiting, diarrhea, urinary frequency, urgency  dysuria, hematuria, vaginitis symptoms,  swelling of joints,easy bruising,  myalgias, arthralgias, skin rashes, unexplained  weight loss and except as is mentioned in the history of present illness, patient's review of systems is otherwise negative.     Physical Exam  Bp:  122/78    P: 80 regular   R: 20    Temperature: 99.2 degrees F orally     Weight: 217 lbs.   Height: 5'3.5"      BMI: 38.1  Neck: supple without masses or thyromegaly Lungs: clear to auscultation Heart: regular rate and rhythm Abdomen: soft, non-tender and no organomegaly Pelvic:patient declined pelvic exam at pre-op but previous exam, 01/18/17,  revealed: EGBUS- wnl; vagina-normal rugae; uterus-normal size but tender with exam limited by habitus, cervix without lesions or motion tenderness; adnexae-no tenderness or masses Extremities:  no clubbing, cyanosis or edema   Assesment:  Postmenopausal Bleeding                       Adenomyosis                       Pelvic Pain                       Fibroid  Disposition:  A discussion was held with patient regarding the indication for her procedure(s) along with the risks, which include but are not limited to: reaction to anesthesia, damage to adjacent organs, infection,  excessive bleeding and the possible need for an open abdominal incision.  The patient verbalized understanding of these risks and has consented to proceed with a Laparoscopically Assisted Vaginal Hysterectomy with Bilateral Salpingo-oophorectomy and Possible Cystoscopy at [facility] on March 19, 2017   CSN# [id number]   Kenny Rea J. Florene Glen, PA-C  for Dr.  Harvie Bridge. Mancel Bale

## 2017-03-14 ENCOUNTER — Other Ambulatory Visit: Payer: Self-pay

## 2017-03-14 ENCOUNTER — Encounter (HOSPITAL_COMMUNITY)
Admission: RE | Admit: 2017-03-14 | Discharge: 2017-03-14 | Disposition: A | Discharge status: Home / Self Care | Payer: BC Managed Care – PPO | Source: Ambulatory Visit | Attending: Obstetrics and Gynecology | Admitting: Obstetrics and Gynecology

## 2017-03-14 ENCOUNTER — Encounter (HOSPITAL_COMMUNITY): Payer: Self-pay

## 2017-03-14 ENCOUNTER — Other Ambulatory Visit: Payer: Self-pay | Admitting: Obstetrics and Gynecology

## 2017-03-14 DIAGNOSIS — F410 Panic disorder [episodic paroxysmal anxiety] without agoraphobia: Secondary | ICD-10-CM | POA: Diagnosis not present

## 2017-03-14 DIAGNOSIS — N921 Excessive and frequent menstruation with irregular cycle: Secondary | ICD-10-CM | POA: Diagnosis not present

## 2017-03-14 DIAGNOSIS — R1031 Right lower quadrant pain: Secondary | ICD-10-CM | POA: Insufficient documentation

## 2017-03-14 DIAGNOSIS — R110 Nausea: Secondary | ICD-10-CM | POA: Insufficient documentation

## 2017-03-14 DIAGNOSIS — K210 Gastro-esophageal reflux disease with esophagitis: Secondary | ICD-10-CM | POA: Diagnosis not present

## 2017-03-14 DIAGNOSIS — Z01812 Encounter for preprocedural laboratory examination: Secondary | ICD-10-CM | POA: Diagnosis not present

## 2017-03-14 DIAGNOSIS — F4321 Adjustment disorder with depressed mood: Secondary | ICD-10-CM | POA: Diagnosis not present

## 2017-03-14 DIAGNOSIS — Z01810 Encounter for preprocedural cardiovascular examination: Secondary | ICD-10-CM | POA: Diagnosis present

## 2017-03-14 DIAGNOSIS — R7989 Other specified abnormal findings of blood chemistry: Secondary | ICD-10-CM | POA: Insufficient documentation

## 2017-03-14 DIAGNOSIS — E559 Vitamin D deficiency, unspecified: Secondary | ICD-10-CM | POA: Diagnosis not present

## 2017-03-14 HISTORY — DX: Bronchitis, not specified as acute or chronic: J40

## 2017-03-14 LAB — CBC
HCT: 36.9 % (ref 36.0–46.0)
Hemoglobin: 12 g/dL (ref 12.0–15.0)
MCH: 27.7 pg (ref 26.0–34.0)
MCHC: 32.5 g/dL (ref 30.0–36.0)
MCV: 85.2 fL (ref 78.0–100.0)
Platelets: 499 10*3/uL — ABNORMAL HIGH (ref 150–400)
RBC: 4.33 MIL/uL (ref 3.87–5.11)
RDW: 15.5 % (ref 11.5–15.5)
WBC: 8.4 10*3/uL (ref 4.0–10.5)

## 2017-03-14 LAB — BASIC METABOLIC PANEL
ANION GAP: 6 (ref 5–15)
BUN: 8 mg/dL (ref 6–20)
CHLORIDE: 103 mmol/L (ref 101–111)
CO2: 26 mmol/L (ref 22–32)
Calcium: 8.8 mg/dL — ABNORMAL LOW (ref 8.9–10.3)
Creatinine, Ser: 0.67 mg/dL (ref 0.44–1.00)
GFR calc non Af Amer: >60 mL/min (ref >60)
Glucose, Bld: 100 mg/dL — ABNORMAL HIGH (ref 65–99)
Potassium: 4 mmol/L (ref 3.5–5.1)
Sodium: 135 mmol/L (ref 135–145)

## 2017-03-14 NOTE — Patient Instructions (Addendum)
Your procedure is scheduled on: Tuesday March 19, 2017 at 12:20  Enter through the Main Entrance of [facility] at: 10:45 am  Pick up the phone at the desk and dial [phone].  Call this number if you have problems the morning of surgery: [phone].  Remember: Do NOT eat food: after Midnight on Monday January 14 Do NOT drink clear liquids after: 6:15 am day of surgery Take these medicines the morning of surgery with a SIP OF WATER:  Zantac, bring albuterol inhaler with you day of surgery  Do NOT wear jewelry (body piercing), metal hair clips/bobby pins, make-up, or nail polish. Do NOT wear lotions, powders, or perfumes.  You may wear deoderant. Do NOT shave for 48 hours prior to surgery. Do NOT bring valuables to the hospital. Contacts, dentures, or bridgework may not be worn into surgery. Leave suitcase in car.  After surgery it may be brought to your room.   For patients admitted to the hospital, checkout time is 11:00 AM the day of discharge.

## 2017-03-14 NOTE — Pre-Procedure Instructions (Signed)
Dr. Candida Peeling viewed and okay' d EKG

## 2017-03-19 ENCOUNTER — Observation Stay (HOSPITAL_COMMUNITY)
Admission: RE | Admit: 2017-03-19 | Discharge: 2017-03-20 | Disposition: A | Discharge status: Home / Self Care | Payer: BC Managed Care – PPO | Source: Ambulatory Visit | Attending: Obstetrics and Gynecology | Admitting: Obstetrics and Gynecology

## 2017-03-19 ENCOUNTER — Ambulatory Visit (HOSPITAL_COMMUNITY): Payer: BC Managed Care – PPO | Admitting: Certified Registered Nurse Anesthetist

## 2017-03-19 ENCOUNTER — Encounter (HOSPITAL_COMMUNITY): Payer: Self-pay

## 2017-03-19 ENCOUNTER — Encounter (HOSPITAL_COMMUNITY)
Admission: RE | Disposition: A | Discharge status: Home / Self Care | Payer: Self-pay | Source: Ambulatory Visit | Attending: Obstetrics and Gynecology

## 2017-03-19 ENCOUNTER — Other Ambulatory Visit: Payer: Self-pay

## 2017-03-19 DIAGNOSIS — Z79899 Other long term (current) drug therapy: Secondary | ICD-10-CM | POA: Insufficient documentation

## 2017-03-19 DIAGNOSIS — Z833 Family history of diabetes mellitus: Secondary | ICD-10-CM | POA: Diagnosis not present

## 2017-03-19 DIAGNOSIS — Z886 Allergy status to analgesic agent status: Secondary | ICD-10-CM | POA: Insufficient documentation

## 2017-03-19 DIAGNOSIS — Z885 Allergy status to narcotic agent status: Secondary | ICD-10-CM | POA: Diagnosis not present

## 2017-03-19 DIAGNOSIS — Z800 Family history of malignant neoplasm of digestive organs: Secondary | ICD-10-CM | POA: Insufficient documentation

## 2017-03-19 DIAGNOSIS — F410 Panic disorder [episodic paroxysmal anxiety] without agoraphobia: Secondary | ICD-10-CM | POA: Diagnosis not present

## 2017-03-19 DIAGNOSIS — N888 Other specified noninflammatory disorders of cervix uteri: Principal | ICD-10-CM | POA: Insufficient documentation

## 2017-03-19 DIAGNOSIS — R102 Pelvic and perineal pain: Secondary | ICD-10-CM | POA: Diagnosis not present

## 2017-03-19 DIAGNOSIS — Z8249 Family history of ischemic heart disease and other diseases of the circulatory system: Secondary | ICD-10-CM | POA: Diagnosis not present

## 2017-03-19 DIAGNOSIS — G43909 Migraine, unspecified, not intractable, without status migrainosus: Secondary | ICD-10-CM | POA: Diagnosis not present

## 2017-03-19 DIAGNOSIS — Z9049 Acquired absence of other specified parts of digestive tract: Secondary | ICD-10-CM | POA: Diagnosis not present

## 2017-03-19 DIAGNOSIS — E559 Vitamin D deficiency, unspecified: Secondary | ICD-10-CM | POA: Diagnosis not present

## 2017-03-19 DIAGNOSIS — K219 Gastro-esophageal reflux disease without esophagitis: Secondary | ICD-10-CM | POA: Diagnosis not present

## 2017-03-19 DIAGNOSIS — N950 Postmenopausal bleeding: Secondary | ICD-10-CM | POA: Diagnosis present

## 2017-03-19 DIAGNOSIS — D259 Leiomyoma of uterus, unspecified: Secondary | ICD-10-CM | POA: Insufficient documentation

## 2017-03-19 DIAGNOSIS — Z8711 Personal history of peptic ulcer disease: Secondary | ICD-10-CM | POA: Insufficient documentation

## 2017-03-19 DIAGNOSIS — N800 Endometriosis of uterus: Secondary | ICD-10-CM | POA: Diagnosis not present

## 2017-03-19 DIAGNOSIS — Z832 Family history of diseases of the blood and blood-forming organs and certain disorders involving the immune mechanism: Secondary | ICD-10-CM | POA: Diagnosis not present

## 2017-03-19 DIAGNOSIS — Z7951 Long term (current) use of inhaled steroids: Secondary | ICD-10-CM | POA: Diagnosis not present

## 2017-03-19 DIAGNOSIS — Z8619 Personal history of other infectious and parasitic diseases: Secondary | ICD-10-CM | POA: Diagnosis not present

## 2017-03-19 HISTORY — PX: LAPAROSCOPIC VAGINAL HYSTERECTOMY WITH SALPINGO OOPHORECTOMY: SHX6681

## 2017-03-19 HISTORY — PX: CYSTOSCOPY: SHX5120

## 2017-03-19 SURGERY — HYSTERECTOMY, VAGINAL, LAPAROSCOPY-ASSISTED, WITH SALPINGO-OOPHORECTOMY
Anesthesia: General | Site: Bladder

## 2017-03-19 MED ORDER — BUPIVACAINE HCL (PF) 0.25 % IJ SOLN
INTRAMUSCULAR | Status: AC
  Filled 2017-03-19: qty 30

## 2017-03-19 MED ORDER — DEXAMETHASONE SODIUM PHOSPHATE 10 MG/ML IJ SOLN
INTRAMUSCULAR | Status: DC | PRN
  Administered 2017-03-19: 4 mg via INTRAVENOUS

## 2017-03-19 MED ORDER — ROCURONIUM BROMIDE 100 MG/10ML IV SOLN
INTRAVENOUS | Status: DC | PRN
  Administered 2017-03-19 (×2): 10 mg via INTRAVENOUS
  Administered 2017-03-19: 5 mg via INTRAVENOUS
  Administered 2017-03-19: 25 mg via INTRAVENOUS

## 2017-03-19 MED ORDER — PROMETHAZINE HCL 25 MG/ML IJ SOLN
INTRAMUSCULAR | Status: AC
  Administered 2017-03-19: 6.25 mg via INTRAVENOUS
  Filled 2017-03-19: qty 1

## 2017-03-19 MED ORDER — MIDAZOLAM HCL 2 MG/2ML IJ SOLN
INTRAMUSCULAR | Status: AC
  Filled 2017-03-19: qty 2

## 2017-03-19 MED ORDER — CEFAZOLIN SODIUM-DEXTROSE 2-4 GM/100ML-% IV SOLN
2.0000 g | INTRAVENOUS | Status: AC
  Administered 2017-03-19: 2 g via INTRAVENOUS
  Filled 2017-03-19: qty 100

## 2017-03-19 MED ORDER — STERILE WATER FOR IRRIGATION IR SOLN
Status: DC | PRN
  Administered 2017-03-19: 1000 mL via INTRAVESICAL

## 2017-03-19 MED ORDER — SODIUM CHLORIDE 0.9 % IR SOLN
Status: DC | PRN
  Administered 2017-03-19: 3000 mL

## 2017-03-19 MED ORDER — LIDOCAINE HCL (CARDIAC) 20 MG/ML IV SOLN
INTRAVENOUS | Status: DC | PRN
  Administered 2017-03-19: 75 mg via INTRAVENOUS

## 2017-03-19 MED ORDER — ACETAMINOPHEN 10 MG/ML IV SOLN
INTRAVENOUS | Status: DC | PRN
  Administered 2017-03-19: 1000 mg via INTRAVENOUS

## 2017-03-19 MED ORDER — HYDROMORPHONE HCL 1 MG/ML IJ SOLN
INTRAMUSCULAR | Status: AC
  Administered 2017-03-19: 0.5 mg via INTRAVENOUS
  Filled 2017-03-19: qty 1

## 2017-03-19 MED ORDER — FENTANYL CITRATE (PF) 100 MCG/2ML IJ SOLN
INTRAMUSCULAR | Status: AC
  Filled 2017-03-19: qty 2

## 2017-03-19 MED ORDER — PROPOFOL 10 MG/ML IV BOLUS
INTRAVENOUS | Status: DC | PRN
  Administered 2017-03-19: 200 mg via INTRAVENOUS

## 2017-03-19 MED ORDER — LACTATED RINGERS IV SOLN
INTRAVENOUS | Status: DC
  Administered 2017-03-19 – 2017-03-20 (×2): via INTRAVENOUS

## 2017-03-19 MED ORDER — ACETAMINOPHEN 10 MG/ML IV SOLN
INTRAVENOUS | Status: AC
  Filled 2017-03-19: qty 100

## 2017-03-19 MED ORDER — HYDROMORPHONE 1 MG/ML IV SOLN
INTRAVENOUS | Status: DC
  Administered 2017-03-19: 18:00:00 via INTRAVENOUS
  Administered 2017-03-19: 2.1 mg via INTRAVENOUS
  Administered 2017-03-20: 1.2 mg via INTRAVENOUS
  Administered 2017-03-20: 0.3 mg via INTRAVENOUS
  Filled 2017-03-19: qty 25

## 2017-03-19 MED ORDER — HYDROMORPHONE HCL 1 MG/ML IJ SOLN
0.2500 mg | INTRAMUSCULAR | Status: DC | PRN
  Administered 2017-03-19 (×4): 0.5 mg via INTRAVENOUS

## 2017-03-19 MED ORDER — HYDROCODONE-ACETAMINOPHEN 5-325 MG PO TABS
1.0000 | ORAL_TABLET | ORAL | Status: DC | PRN
  Administered 2017-03-20 (×2): 1 via ORAL
  Filled 2017-03-19 (×2): qty 1

## 2017-03-19 MED ORDER — SODIUM CHLORIDE 0.9 % IJ SOLN
INTRAMUSCULAR | Status: AC
  Filled 2017-03-19: qty 10

## 2017-03-19 MED ORDER — FENTANYL CITRATE (PF) 100 MCG/2ML IJ SOLN
INTRAMUSCULAR | Status: DC | PRN
  Administered 2017-03-19 (×7): 50 ug via INTRAVENOUS

## 2017-03-19 MED ORDER — PROPOFOL 10 MG/ML IV BOLUS
INTRAVENOUS | Status: AC
  Filled 2017-03-19: qty 20

## 2017-03-19 MED ORDER — FENTANYL CITRATE (PF) 250 MCG/5ML IJ SOLN
INTRAMUSCULAR | Status: AC
  Filled 2017-03-19: qty 5

## 2017-03-19 MED ORDER — NALOXONE HCL 0.4 MG/ML IJ SOLN: 0.4000 mg | INTRAMUSCULAR | Status: DC | PRN

## 2017-03-19 MED ORDER — ONDANSETRON HCL 4 MG PO TABS: 4.0000 mg | ORAL_TABLET | Freq: Three times a day (TID) | ORAL | Status: DC | PRN

## 2017-03-19 MED ORDER — MIDAZOLAM HCL 2 MG/2ML IJ SOLN
INTRAMUSCULAR | Status: DC | PRN
  Administered 2017-03-19: 2 mg via INTRAVENOUS

## 2017-03-19 MED ORDER — SUGAMMADEX SODIUM 200 MG/2ML IV SOLN
INTRAVENOUS | Status: DC | PRN
  Administered 2017-03-19: 200 mg via INTRAVENOUS

## 2017-03-19 MED ORDER — ONDANSETRON HCL 4 MG/2ML IJ SOLN
INTRAMUSCULAR | Status: DC | PRN
  Administered 2017-03-19: 4 mg via INTRAVENOUS

## 2017-03-19 MED ORDER — SODIUM CHLORIDE 0.9% FLUSH: 9.0000 mL | INTRAVENOUS | Status: DC | PRN

## 2017-03-19 MED ORDER — ONDANSETRON HCL 4 MG/2ML IJ SOLN: 4.0000 mg | Freq: Four times a day (QID) | INTRAMUSCULAR | Status: DC | PRN

## 2017-03-19 MED ORDER — VASOPRESSIN 20 UNIT/ML IV SOLN
INTRAVENOUS | Status: AC
  Filled 2017-03-19: qty 1

## 2017-03-19 MED ORDER — VASOPRESSIN 20 UNIT/ML IV SOLN
INTRAVENOUS | Status: DC | PRN
  Administered 2017-03-19: 19 mL via INTRAMUSCULAR

## 2017-03-19 MED ORDER — DIPHENHYDRAMINE HCL 50 MG/ML IJ SOLN: 12.5000 mg | Freq: Four times a day (QID) | INTRAMUSCULAR | Status: DC | PRN

## 2017-03-19 MED ORDER — SODIUM CHLORIDE 0.9 % IJ SOLN
INTRAMUSCULAR | Status: DC | PRN
  Administered 2017-03-19: 10 mL

## 2017-03-19 MED ORDER — SUGAMMADEX SODIUM 200 MG/2ML IV SOLN
INTRAVENOUS | Status: AC
  Filled 2017-03-19: qty 2

## 2017-03-19 MED ORDER — DIPHENHYDRAMINE HCL 12.5 MG/5ML PO ELIX: 12.5000 mg | ORAL_SOLUTION | Freq: Four times a day (QID) | ORAL | Status: DC | PRN

## 2017-03-19 MED ORDER — SCOPOLAMINE 1 MG/3DAYS TD PT72
MEDICATED_PATCH | TRANSDERMAL | Status: AC
  Administered 2017-03-19: 1.5 mg via TRANSDERMAL
  Filled 2017-03-19: qty 1

## 2017-03-19 MED ORDER — MENTHOL 3 MG MT LOZG: 1.0000 | LOZENGE | OROMUCOSAL | Status: DC | PRN

## 2017-03-19 MED ORDER — PROMETHAZINE HCL 25 MG/ML IJ SOLN
6.2500 mg | INTRAMUSCULAR | Status: DC | PRN
  Administered 2017-03-19: 6.25 mg via INTRAVENOUS

## 2017-03-19 MED ORDER — CEFAZOLIN SODIUM-DEXTROSE 2-3 GM-%(50ML) IV SOLR
INTRAVENOUS | Status: AC
  Filled 2017-03-19: qty 50

## 2017-03-19 MED ORDER — LIDOCAINE HCL (CARDIAC) 20 MG/ML IV SOLN
INTRAVENOUS | Status: AC
  Filled 2017-03-19: qty 5

## 2017-03-19 MED ORDER — SCOPOLAMINE 1 MG/3DAYS TD PT72
1.0000 | MEDICATED_PATCH | TRANSDERMAL | Status: DC
  Administered 2017-03-19: 1.5 mg via TRANSDERMAL

## 2017-03-19 MED ORDER — ONDANSETRON HCL 4 MG/2ML IJ SOLN
INTRAMUSCULAR | Status: AC
  Filled 2017-03-19: qty 2

## 2017-03-19 MED ORDER — LACTATED RINGERS IV SOLN
INTRAVENOUS | Status: DC
  Administered 2017-03-19 (×3): via INTRAVENOUS

## 2017-03-19 MED ORDER — BUPIVACAINE HCL (PF) 0.25 % IJ SOLN
INTRAMUSCULAR | Status: DC | PRN
  Administered 2017-03-19: 15 mL

## 2017-03-19 MED ORDER — FAMOTIDINE 20 MG PO TABS
20.0000 mg | ORAL_TABLET | Freq: Two times a day (BID) | ORAL | Status: DC
  Administered 2017-03-19 – 2017-03-20 (×2): 20 mg via ORAL
  Filled 2017-03-19 (×2): qty 1

## 2017-03-19 MED ORDER — SODIUM CHLORIDE 0.9 % IJ SOLN
INTRAMUSCULAR | Status: AC
  Filled 2017-03-19: qty 50

## 2017-03-19 MED ORDER — DOCUSATE SODIUM 100 MG PO CAPS
100.0000 mg | ORAL_CAPSULE | Freq: Two times a day (BID) | ORAL | Status: DC
  Administered 2017-03-19 – 2017-03-20 (×2): 100 mg via ORAL
  Filled 2017-03-19 (×2): qty 1

## 2017-03-19 MED ORDER — ALBUTEROL SULFATE (2.5 MG/3ML) 0.083% IN NEBU: 3.0000 mL | INHALATION_SOLUTION | RESPIRATORY_TRACT | Status: DC | PRN

## 2017-03-19 MED ORDER — DEXAMETHASONE SODIUM PHOSPHATE 4 MG/ML IJ SOLN
INTRAMUSCULAR | Status: AC
  Filled 2017-03-19: qty 1

## 2017-03-19 MED ORDER — SUCCINYLCHOLINE CHLORIDE 200 MG/10ML IV SOSY
PREFILLED_SYRINGE | INTRAVENOUS | Status: AC
  Filled 2017-03-19: qty 10

## 2017-03-19 MED ORDER — SUCCINYLCHOLINE CHLORIDE 20 MG/ML IJ SOLN
INTRAMUSCULAR | Status: DC | PRN
  Administered 2017-03-19: 120 mg via INTRAVENOUS

## 2017-03-19 SURGICAL SUPPLY — 53 items
BARRIER ADHS 3X4 INTERCEED (GAUZE/BANDAGES/DRESSINGS) IMPLANT
CABLE HIGH FREQUENCY MONO STRZ (ELECTRODE) IMPLANT
CANISTER SUCT 3000ML PPV (MISCELLANEOUS) ×4 IMPLANT
CONT PATH 16OZ SNAP LID 3702 (MISCELLANEOUS) ×4 IMPLANT
COVER BACK TABLE 60X90IN (DRAPES) ×4 IMPLANT
COVER MAYO STAND STRL (DRAPES) ×4 IMPLANT
DECANTER SPIKE VIAL GLASS SM (MISCELLANEOUS) ×16 IMPLANT
DERMABOND ADVANCED (GAUZE/BANDAGES/DRESSINGS) ×2
DERMABOND ADVANCED .7 DNX12 (GAUZE/BANDAGES/DRESSINGS) ×2 IMPLANT
DRSG OPSITE POSTOP 3X4 (GAUZE/BANDAGES/DRESSINGS) ×4 IMPLANT
DURAPREP 26ML APPLICATOR (WOUND CARE) ×4 IMPLANT
ELECT REM PT RETURN 9FT ADLT (ELECTROSURGICAL) ×4
ELECTRODE REM PT RTRN 9FT ADLT (ELECTROSURGICAL) ×2 IMPLANT
FILTER SMOKE EVAC LAPAROSHD (FILTER) ×4 IMPLANT
FORCEPS CUTTING 33CM 5MM (CUTTING FORCEPS) ×4 IMPLANT
GAUZE PACKING 2X5 YD STRL (GAUZE/BANDAGES/DRESSINGS) IMPLANT
GLOVE BIO SURGEON STRL SZ7.5 (GLOVE) ×8 IMPLANT
GLOVE BIOGEL PI IND STRL 7.0 (GLOVE) ×4 IMPLANT
GLOVE BIOGEL PI IND STRL 7.5 (GLOVE) ×6 IMPLANT
GLOVE BIOGEL PI INDICATOR 7.0 (GLOVE) ×4
GLOVE BIOGEL PI INDICATOR 7.5 (GLOVE) ×6
GOWN STRL REUS W/TWL LRG LVL3 (GOWN DISPOSABLE) ×8 IMPLANT
HEMOSTAT SURGICEL 4X8 (HEMOSTASIS) ×4 IMPLANT
LEGGING LITHOTOMY PAIR STRL (DRAPES) ×4 IMPLANT
NEEDLE INSUFFLATION 120MM (ENDOMECHANICALS) ×4 IMPLANT
NEEDLE MAYO CATGUT SZ4 (NEEDLE) IMPLANT
NS IRRIG 1000ML POUR BTL (IV SOLUTION) ×4 IMPLANT
PACK LAVH (CUSTOM PROCEDURE TRAY) ×4 IMPLANT
PACK ROBOTIC GOWN (GOWN DISPOSABLE) ×4 IMPLANT
PACK TRENDGUARD 450 HYBRID PRO (MISCELLANEOUS) ×2 IMPLANT
PROTECTOR NERVE ULNAR (MISCELLANEOUS) ×8 IMPLANT
SCISSORS LAP 5X35 DISP (ENDOMECHANICALS) IMPLANT
SET CYSTO W/LG BORE CLAMP LF (SET/KITS/TRAYS/PACK) ×4 IMPLANT
SET IRRIG TUBING LAPAROSCOPIC (IRRIGATION / IRRIGATOR) ×4 IMPLANT
SHEARS HARMONIC ACE PLUS 36CM (ENDOMECHANICALS) IMPLANT
SLEEVE XCEL OPT CAN 5 100 (ENDOMECHANICALS) ×4 IMPLANT
SOLUTION ELECTROLUBE (MISCELLANEOUS) IMPLANT
SUT CHROMIC 2 0 SH (SUTURE) IMPLANT
SUT MON AB 4-0 PS1 27 (SUTURE) ×4 IMPLANT
SUT VIC AB 0 CT1 18XCR BRD8 (SUTURE) ×4 IMPLANT
SUT VIC AB 0 CT1 27 (SUTURE) ×2
SUT VIC AB 0 CT1 27XBRD ANBCTR (SUTURE) ×2 IMPLANT
SUT VIC AB 0 CT1 36 (SUTURE) ×4 IMPLANT
SUT VIC AB 0 CT1 8-18 (SUTURE) ×4
SUT VICRYL 0 TIES 12 18 (SUTURE) ×4 IMPLANT
SUT VICRYL 0 UR6 27IN ABS (SUTURE) IMPLANT
TOWEL OR 17X24 6PK STRL BLUE (TOWEL DISPOSABLE) ×8 IMPLANT
TRAY FOLEY CATH SILVER 14FR (SET/KITS/TRAYS/PACK) ×4 IMPLANT
TRENDGUARD 450 HYBRID PRO PACK (MISCELLANEOUS) ×4
TROCAR BALLN 12MMX100 BLUNT (TROCAR) IMPLANT
TROCAR XCEL NON-BLD 11X100MML (ENDOMECHANICALS) ×4 IMPLANT
TROCAR XCEL NON-BLD 5MMX100MML (ENDOMECHANICALS) ×4 IMPLANT
WARMER LAPAROSCOPE (MISCELLANEOUS) ×4 IMPLANT

## 2017-03-19 NOTE — Interval H&P Note (Signed)
History and Physical Interval Note:  03/19/2017 11:42 AM  Kelly Abbott  has presented today for surgery, with the diagnosis of Post Menopausal Bleeing, Pelvic Pain  The various methods of treatment have been discussed with the patient and family. After consideration of risks, benefits and other options for treatment, the patient has consented to  Procedure(s) with comments: LAPAROSCOPIC ASSISTED VAGINAL HYSTERECTOMY WITH SALPINGO OOPHORECTOMY (Bilateral) - 2.5 hours CYSTOSCOPY (N/A) POSSIBLE ABDOMINAL HYSTERECTOMY as a surgical intervention .  The patient's history has been reviewed, patient examined, no change in status, stable for surgery.  I have reviewed the patient's chart and labs.  Questions were answered to the patient's satisfaction.     Delice Lesch

## 2017-03-19 NOTE — Transfer of Care (Signed)
Immediate Anesthesia Transfer of Care Note  Patient: Kelly Abbott  Procedure(s) Performed: LAPAROSCOPIC ASSISTED VAGINAL HYSTERECTOMY WITH SALPINGO OOPHORECTOMY (Bilateral Abdomen) CYSTOSCOPY (N/A Bladder)  Patient Location: PACU  Anesthesia Type:General  Level of Consciousness: awake, alert , oriented and patient cooperative  Airway & Oxygen Therapy: Patient Spontanous Breathing and Patient connected to nasal cannula oxygen  Post-op Assessment: Report given to RN, Post -op Vital signs reviewed and stable and Patient moving all extremities X 4  Post vital signs: Reviewed and stable  Last Vitals:  Vitals:   03/19/17 1041 03/19/17 1600  BP: 112/78 (!) 147/79  Pulse: 71 82  Resp: 16 11  Temp: 36.9 C 37.1 C  SpO2: 100% 100%    Last Pain:  Vitals:   03/19/17 1041  TempSrc: Oral         Complications: No apparent anesthesia complications

## 2017-03-19 NOTE — Anesthesia Postprocedure Evaluation (Signed)
Anesthesia Post Note  Patient: Verdelle Valtierra  Procedure(s) Performed: LAPAROSCOPIC ASSISTED VAGINAL HYSTERECTOMY WITH SALPINGO OOPHORECTOMY (Bilateral Abdomen) CYSTOSCOPY (N/A Bladder)     Patient location during evaluation: PACU Anesthesia Type: General Level of consciousness: awake and alert Pain management: pain level controlled Vital Signs Assessment: post-procedure vital signs reviewed and stable Respiratory status: spontaneous breathing, nonlabored ventilation, respiratory function stable and patient connected to nasal cannula oxygen Cardiovascular status: blood pressure returned to baseline and stable Postop Assessment: no apparent nausea or vomiting Anesthetic complications: no    Last Vitals:  Vitals:   03/19/17 1730 03/19/17 1750  BP: (!) 145/78 (!) 153/70  Pulse: 80 85  Resp: 11 14  Temp:  37.1 C  SpO2: 99% 95%    Last Pain:  Vitals:   03/19/17 1730  TempSrc:   PainSc: Asleep   Pain Goal:                 Tiajuana Amass

## 2017-03-19 NOTE — Anesthesia Preprocedure Evaluation (Signed)
Anesthesia Evaluation  Patient identified by MRN, date of birth, ID band Patient awake    Reviewed: Allergy & Precautions, NPO status , [name] Chart, lab work & pertinent test results  Airway Mallampati: II  TM Distance: >3 FB Neck ROM: Full    Dental  (+) Dental Advisory Given   Pulmonary neg pulmonary ROS,    breath sounds clear to auscultation       Cardiovascular negative cardio ROS   Rhythm:Regular Rate:Normal     Neuro/Psych  Headaches, Anxiety    GI/Hepatic Neg liver ROS, PUD, GERD  Medicated,  Endo/Other  negative endocrine ROS  Renal/GU negative Renal ROS     Musculoskeletal   Abdominal   Peds  Hematology negative hematology ROS (+)   Anesthesia Other Findings   Reproductive/Obstetrics                             Lab Results  Component Value Date   WBC 8.4 03/14/2017   HGB 12.0 03/14/2017   HCT 36.9 03/14/2017   MCV 85.2 03/14/2017   PLT 499 (H) 03/14/2017   Lab Results  Component Value Date   CREATININE 0.67 03/14/2017   BUN 8 03/14/2017   NA 135 03/14/2017   K 4.0 03/14/2017   CL 103 03/14/2017   CO2 26 03/14/2017    Anesthesia Physical Anesthesia Plan  ASA: II  Anesthesia Plan: General   Post-op Pain Management:    Induction: Intravenous  PONV Risk Score and Plan: 4 or greater and Scopolamine patch - Pre-op, Dexamethasone, Ondansetron and Treatment may vary due to age or medical condition  Airway Management Planned: Oral ETT  Additional Equipment:   Intra-op Plan:   Post-operative Plan: Extubation in OR  Informed Consent: I have reviewed the patients History and Physical, chart, labs and discussed the procedure including the risks, benefits and alternatives for the proposed anesthesia with the patient or authorized representative who has indicated his/her understanding and acceptance.   Dental advisory given  Plan Discussed with:  CRNA  Anesthesia Plan Comments:         Anesthesia Quick Evaluation

## 2017-03-19 NOTE — Anesthesia Procedure Notes (Signed)
Procedure Name: Intubation Performed by: Sandrea Matte, CRNA Pre-anesthesia Checklist: Patient identified, Emergency Drugs available, Suction available and Patient being monitored Patient Re-evaluated:Patient Re-evaluated prior to induction Oxygen Delivery Method: Circle system utilized Preoxygenation: Pre-oxygenation with 100% oxygen Induction Type: IV induction Ventilation: Oral airway inserted - appropriate to patient size and Mask ventilation without difficulty Laryngoscope Size: Mac and 4 Grade View: Grade II Tube type: Oral Tube size: 7.0 mm Number of attempts: 1 Airway Equipment and Method: Stylet Placement Confirmation: ETT inserted through vocal cords under direct vision,  positive ETCO2 and breath sounds checked- equal and bilateral Secured at: 22 (@ teeth) cm Tube secured with: Tape

## 2017-03-19 NOTE — Op Note (Addendum)
Preop Diagnosis: 1.Pelvic Pain 2.PMB/Irregular Bleeding 3.Possible Adenomyosis  Postop Diagnosis: 1.Pelvic Pain 2.PMB/Irregular Bleeding 3.Possible Adenomyosis   Procedure: 1.LAPAROSCOPIC ASSISTED VAGINAL HYSTERECTOMY 2.BILATERAL SALPINGO-OOPHORECTOMY 3.CYSTOSCOPY  Anesthesia: General   Attending: Dr. Everett Graff   Assistant:  E. Florene Glen, PA-C  Findings: Nl appearing uterus, cervix, bilateral fallopian tubes.  No endometriotic lesions noted.  Pathology: 1.uterus and cervix 2.bilateral fallopian tubes   Fluids: 2300 cc  UOP: 250 cc  EBL: 50 cc  Complications: None  Procedure:  The patient was taken to the operating room after the risks, benefits, alternatives, complications, treatment options, and expected outcomes were discussed with the patient. The patient verbalized understanding, the patient concurred with the proposed plan and consent signed and witnessed. The patient was taken to the Operating Room and identified as Kelly Abbott and the procedure verified as LAVH/BSO/Cystoscopy.  The patient was placed under general anesthesia per anesthesia staff, the patient was placed in modified dorsal lithotomy position and was prepped, draped, and catheterized in the normal, sterile fashion.  A Time Out was held and the above information confirmed.  The cervix was visualized and an intrauterine manipulator was placed.  A 10 mm umbilical incision was then performed. Veress needle was passed and pneumoperitoneum was established. A 10 mm trocar was advanced into the intraabdominal cavity, the laparoscope was introduced and findings as noted above.  Patient was placed in trendelenburg and marcaine injected in the LLQ and a 5 mm incision was made and 5 mm trocar advanced into the intraabdominal cavity.  The same was done in the RLQ and a 10 mm in the suprapubic area.   The Gyrus tripolar was used to incise the left infundibulopelvic ligament down to the level of  the round ligament.  The round ligament was then cauterized and cut with the Gyrus and bladder flap created.  The same was done on the contralateral side. Attention was then turned to the perineum after covering the abdominal trocars.  The intrauterine manipulator was removed and weighted speculum placed.  The anterior lip of the cervix was grasped with a single tooth tenaculum and the cervix injected with dilute pitressin (20u in 50cc NS).  The cervix was then circumscribed with the bovie and anterior cul-de-sac entered without difficulty.  The posterior cul-de-sac was then entered as well and uterosacral ligament on the right clamped with the heaney clamp, cut and suture ligated.  The same was done on the contralateral side.  Weighted speculum was then removed and long weighted speculum placed in the vagina and in the posterior cul-de-sac.  In a sequential fashion, the paracervical tissue and parametrial tissue was clamped cut and suture ligated on the right and then on the contralateral side until the uterine fundus was able to be flipped using a sweetheart tenaculum.  The remaining pedicle on the right was clamped cut and ligated and then suture ligated.  The same was done on the contralateral side and uterus and cervix removed.  The cuff was closed with figure of eight stitches of 0 vicryl in a horizontal fashion.  Cystoscopy was performed and bilateral ureters were noted to efflux without difficulty and no inadvertent bladder injuries were noted.  Attention was then returned to the abdomen after regowning and regloving.  The laparoscope was introduced at the umbilicus and inspection performed.  The abdomen was copiously irrigated.  There was small amount of oozing noted at the cuff and surgicel was placed and good hemostasis noted.  Good hemostasis at all pedicles was noted.  The suprapubic, right and left lower quadrant trocars were removed under direct visualization and pneumoperitoneum relieved.  The  94WN umbilical trocar was then removed under direct visualization and fascia repaired with 0 vicryl.  The umbilical 46EV incision was reapproximated with 3-0 monocryl and then dermabond applied to all four incisions.    Sponge, instrument, lap and needle counts were correct.  The patient tolerated the procedure well and was awaiting extubation and transfer to the recovery room in stable condition.

## 2017-03-20 ENCOUNTER — Encounter (HOSPITAL_COMMUNITY): Payer: Self-pay | Admitting: Obstetrics and Gynecology

## 2017-03-20 DIAGNOSIS — N888 Other specified noninflammatory disorders of cervix uteri: Secondary | ICD-10-CM | POA: Diagnosis not present

## 2017-03-20 LAB — CBC
HEMATOCRIT: 35.3 % — AB (ref 36.0–46.0)
HEMOGLOBIN: 11.5 g/dL — AB (ref 12.0–15.0)
MCH: 27.9 pg (ref 26.0–34.0)
MCHC: 32.6 g/dL (ref 30.0–36.0)
MCV: 85.7 fL (ref 78.0–100.0)
Platelets: 490 10*3/uL — ABNORMAL HIGH (ref 150–400)
RBC: 4.12 MIL/uL (ref 3.87–5.11)
RDW: 15.6 % — AB (ref 11.5–15.5)
WBC: 17.3 10*3/uL — AB (ref 4.0–10.5)

## 2017-03-20 MED ORDER — HYDROCODONE-ACETAMINOPHEN 5-325 MG PO TABS: 1.0000 | ORAL_TABLET | Freq: Four times a day (QID) | ORAL | 0 refills | Status: DC | PRN

## 2017-03-20 MED ORDER — IBUPROFEN 600 MG PO TABS: 600.0000 mg | ORAL_TABLET | Freq: Four times a day (QID) | ORAL | 0 refills | Status: DC | PRN

## 2017-03-20 NOTE — Addendum Note (Signed)
Addendum  created 03/20/17 0914 by Asher Muir, CRNA   Sign clinical note

## 2017-03-20 NOTE — Progress Notes (Signed)
Raeghan Demeter is a61 y.o.  [id number]  Post Op Date #1 LAVH/BSO/Cystoscopy  Subjective: Patient is Doing well postoperatively. Patient has Pain is controlled with current analgesics. Medications being used: narcotic analgesics including PCA Dilaudid. Tolerating a regular diet, ambulating in halls but hasn't voided since Foley removed.    Objective: Vital signs in last 24 hours: Temp:  [98.2 F (36.8 C)-99.1 F (37.3 C)] 98.2 F (36.8 C) (01/16 0410) Pulse Rate:  [64-91] 64 (01/16 0410) Resp:  [10-20] 20 (01/16 0600) BP: (112-153)/(58-79) 113/64 (01/16 0410) SpO2:  [94 %-100 %] 100 % (01/16 0410) Weight:  [214 lb (97.1 kg)] 214 lb (97.1 kg) (01/15 2000)  Intake/Output from previous day: 01/15 0701 - 01/16 0700 In: 2801.3 [P.O.:118; I.V.:2683.3] Out: 1925 [Urine:1875] Intake/Output this shift: Total I/O In: 601.3 [P.O.:118; I.V.:483.3] Out: 1325 [Urine:1325] Recent Labs  Lab 03/14/17 1439  WBC 8.4  HGB 12.0  HCT 36.9  PLT 499*     Recent Labs  Lab 03/14/17 1439  NA 135  K 4.0  CL 103  CO2 26  BUN 8  CREATININE 0.67  CALCIUM 8.8*  GLUCOSE 100*    EXAM: General: alert, cooperative and no distress Resp: clear to auscultation bilaterally Cardio: regular rate and rhythm, S1, S2 normal, no murmur, click, rub or gallop GI: soft, non-tender; bowel sounds normal; no masses,  no organomegaly and dressings are clean/dry/intact. Extremities: Homans sign is negative, no sign of DVT and no calf tenderness (SCD hose in place and functioning). Vaginal Bleeding: none and perineal pad clean.   Assessment: s/p Procedure(s): LAPAROSCOPIC ASSISTED VAGINAL HYSTERECTOMY WITH SALPINGO OOPHORECTOMY CYSTOSCOPY: stable, progressing well and tolerating diet  Plan: Advance to PO medication Routine care  Awaiting patient to void and will consider discharge home today.  LOS: 0 days    Earnstine Regal, PA-C 03/20/2017 7:00 AM   Pt seen at 1900 on POD #0 and was  recovering well.  Afebrile and VSS.  She was sleeping with adequate pain control.  She had NABS.  No calf tenderness and overall benign exam with no VB.  UOP was good with about 400cc over 2hrs and SCDs were on.  I saw pt at 1330 on POD# 1 and she was doing well.  She voided spont without difficulty, ambulating without difficulty and tolerating po with no N/V.  D/C instructions were reviewed and pt instructed to see me in the office prior to an early return to work in 3wks.  Pt said she is already feeling better.

## 2017-03-20 NOTE — Discharge Instructions (Signed)
Call [facility] @ [phone] if:  You have a temperature greater than or equal to 100.4 degrees Farenheit orally You have pain that is not made better by the pain medication given and taken as directed You have excessive bleeding or problems urinating  Take Colace (Docusate Sodium/Stool Softener) 100 mg 2-3 times daily while taking narcotic pain medicine to avoid constipation or until bowel movements are regular.  You may drive after 1 week You may walk up steps  You may shower  You may resume a regular diet  Keep incisions clean and dry; remove honeycomb dressing on March 25, 2017 Do not lift over 15 pounds for 6 weeks Avoid anything in vagina for 6 weeks (or until after your post-operative visit)

## 2017-03-20 NOTE — Anesthesia Postprocedure Evaluation (Signed)
Anesthesia Post Note  Patient: Kelly Abbott  Procedure(s) Performed: LAPAROSCOPIC ASSISTED VAGINAL HYSTERECTOMY WITH SALPINGO OOPHORECTOMY (Bilateral Abdomen) CYSTOSCOPY (N/A Bladder)     Patient location during evaluation: [facility] Anesthesia Type: General Level of consciousness: awake Pain management: satisfactory to patient Vital Signs Assessment: post-procedure vital signs reviewed and stable Respiratory status: spontaneous breathing Cardiovascular status: stable Anesthetic complications: no    Last Vitals:  Vitals:   03/20/17 0600 03/20/17 0800  BP:  (!) 99/53  Pulse:  73  Resp: 20 20  Temp:  37.4 C  SpO2:  99%    Last Pain:  Vitals:   03/20/17 0813  TempSrc:   PainSc: 4    Pain Goal: Patients Stated Pain Goal: 2 (03/20/17 0813)               Casimer Lanius

## 2017-03-20 NOTE — Discharge Summary (Signed)
Physician Discharge Summary  Patient ID: Kelly Abbott MRN: [id number] DOB/AGE: 1955-04-19 62 y.o.  Admit date: 03/19/2017 Discharge date: 03/20/2017   Discharge Diagnoses:  Post Menopausal Bleeding, Pelvic Pain, Adenomyosis and Uterine Fibroids Active Problems:   Post-menopausal bleeding   Operation: Laparoscopically Assisted Vaginal Hysterectomy, Bilateral Salpingo-oophorectomy and Cystoscopy   Discharged Condition: Good  Hospital Course: On the date of admission the patient underwent the aforementioned procedures and tolerated them well.  Post operative course was unremarkable with the patient resuming bowel and bladder function by post operative day #1 and was therefore deemed ready for discharge home.  Discharge hemoglobin was 11.5.  Disposition: 01-Home or Self Care  Discharge Medications:  Allergies as of 03/20/2017      Reactions   Codeine Nausea Only   Asa [aspirin] Hives      Medication List    STOP taking these medications   Hydrocodone-Acetaminophen 5-300 MG Tabs Replaced by:  HYDROcodone-acetaminophen 5-325 MG tablet     TAKE these medications   albuterol 108 (90 Base) MCG/ACT inhaler Commonly known as:  PROVENTIL HFA;VENTOLIN HFA Inhale 2 puffs every 4 (four) hours as needed into the lungs for wheezing or shortness of breath (cough, shortness of breath or wheezing.).   CoQ-10 50 MG Caps Take 50 mg by mouth daily.   Fish Oil 1200 MG Caps Take 1,200 mg by mouth daily.   HYDROcodone-acetaminophen 5-325 MG tablet Commonly known as:  NORCO/VICODIN Take 1 tablet by mouth every 6 (six) hours as needed for moderate pain. Replaces:  Hydrocodone-Acetaminophen 5-300 MG Tabs   ranitidine 150 MG tablet Commonly known as:  ZANTAC Take 1 tablet (150 mg total) by mouth 2 (two) times daily as needed for heartburn.        Follow-up: Dr. Harvie Bridge. Mancel Bale on April 01, 2017 at 3 p.m and  April 29, 2017 at 10:15 a.m.     Signed: Earnstine Regal,  PA-C 03/20/2017, 7:30 AM

## 2017-04-12 ENCOUNTER — Telehealth: Payer: Self-pay | Admitting: Hematology

## 2017-04-12 ENCOUNTER — Encounter: Payer: Self-pay | Admitting: Hematology

## 2017-04-12 NOTE — Telephone Encounter (Signed)
Appt has been scheduled for the pt to see Dr. Irene Limbo on 2/20 at 10am. Pt aware to arrive 30 minutes early. Address verified. Letter mailed.

## 2017-04-22 NOTE — Progress Notes (Signed)
HEMATOLOGY/ONCOLOGY CONSULTATION NOTE  Date of Service: 04/24/2017  Patient Care Team: Golden Circle, FNP as PCP - General (Family Medicine)  CHIEF COMPLAINTS/PURPOSE OF CONSULTATION:  Leukocytosis  HISTORY OF PRESENTING ILLNESS:   Kelly Abbott is a wonderful 62 y.o. female who has been referred to [place] by Dr Everett Graff from [facility] for evaluation and management of leukocytosis.   The pt reports that she is doing well overall. She reports at age 36, a year ago, she had heavy uterine bleeding with significant pain. Of note prior to the patient's visit today, pt has had Laparoscopic Assisted Vaginal Hysterectomy with Salpingo Oophorectomy completed on 03/19/17. She notes that she continues to have some bleeding post-surgery, and pain that she relates to post-surgery. Adenomyosis and small fibroids were noted on her post-surgical pathology.  She denies any concern for IDA nor needing to take Iron supplements in the past. She denies pica or any ice cravings.  She denies any recent infections, and having not taken antibiotics outside of bronchitis in October for which she took albuterol. She denies any joint inflammation issues, or other chronic inflammatory problems. She reports some lower back pain that has onset in the last couple weeks that she says she is not very concerned about.   She notes that she has been experiencing some SOB since having her surgery on 03/19/17. She notes that she experiences this SOB when she walks around the house. She reports that it has been a gradual increase in severity and has been planning on bringing it to the attention of Dr. Mancel Bale next week. She denies any associated CP. She denies tachycardia. She denies taking daily aspirin.  Most recent lab results 03/31/17 WBC were at 13.4k and Platelets were at 513k. On 03/20/17 CBC is as follows: all values are WNL except for WBC at 17.3k, Hgb at 11.5, HCT at 35.3, RDW at 15.6, and  Platelets at 490k.   On review of systems, pt reports some spotting (post-surgery), SOB, lower back pain, and denies fevers, chills, night sweats, unexpected weight loss, pain along the spine, leg swelling, CP, tachycardia, and any other symptoms.   On PMHx the pt reports having her appendectomy. On Social Hx she reports having never smoked.  On Family Hx she reports fhx of colon, uterus and breast cancer.   MEDICAL HISTORY:  Past Medical History:  Diagnosis Date  . Allergy   . Anxiety   . Bronchitis 10/2016  . Gastric ulcer   . GERD (gastroesophageal reflux disease)   . H/O measles   . H/O mumps   . Headache    history of migraines  . History of chicken pox   . Migraines   . Panic attacks 2005  . Shingles   . Vitamin D deficiency   . Wears partial dentures    upper partial    SURGICAL HISTORY: Past Surgical History:  Procedure Laterality Date  . APPENDECTOMY    . BUNIONECTOMY Right 05/01/12   [place], right foot  . BUNIONECTOMY Left   . CHOLECYSTECTOMY N/A 03/18/2013   Procedure: LAPAROSCOPIC CHOLECYSTECTOMY;  Surgeon: Harl Bowie, MD;  Location: [facility];  Service: General;  Laterality: N/A;  . COLONOSCOPY    . CYSTOSCOPY N/A 03/19/2017   Procedure: CYSTOSCOPY;  Surgeon: Everett Graff, MD;  Location: [facility];  Service: Gynecology;  Laterality: N/A;  . DILATATION & CURRETTAGE/HYSTEROSCOPY WITH RESECTOCOPE N/A 09/07/2014   Procedure: DILATATION & CURETTAGE/HYSTEROSCOPY WITH RESECTOCOPE;  Surgeon: Everett Graff, MD;  Location: [facility];  Service: Gynecology;  Laterality: N/A;  . ENDOMETRIAL BIOPSY  Rotator  . IUD REMOVAL    . LAPAROSCOPIC VAGINAL HYSTERECTOMY WITH SALPINGO OOPHORECTOMY Bilateral 03/19/2017   Procedure: LAPAROSCOPIC ASSISTED VAGINAL HYSTERECTOMY WITH SALPINGO OOPHORECTOMY;  Surgeon: Everett Graff, MD;  Location: [facility];  Service: Gynecology;  Laterality: Bilateral;  2.5 hours  . Remove Implant Deep Right 05/01/12/   Right foot  .  ROTATOR CUFF REPAIR  02/09/2015   right  . TUBAL LIGATION    . UPPER GI ENDOSCOPY      SOCIAL HISTORY: Social History   Socioeconomic History  . Marital status: Married    Spouse name: Not on file  . Number of children: 1  . Years of education: 88  . Highest education level: Not on file  Social Needs  . Financial resource strain: Not on file  . Food insecurity - worry: Not on file  . Food insecurity - inability: Not on file  . Transportation needs - medical: Not on file  . Transportation needs - non-medical: Not on file  Occupational History  . Occupation: Agricultural engineer    Comment: Personnel officer  Tobacco Use  . Smoking status: Never Smoker  . Smokeless tobacco: Never Used  Substance and Sexual Activity  . Alcohol use: No  . Drug use: No  . Sexual activity: Yes    Birth control/protection: None  Other Topics Concern  . Not on file  Social History Narrative   Fun: Bowling, shopping, workout   Denies religious beliefs effecting healthcare   Denies abuse and feels safe at home.     FAMILY HISTORY: Family History  Problem Relation Age of Onset  . Hypertension Mother   . Diabetes Mother   . Heart disease Sister   . Hypertension Sister   . Hypertension Brother   . Cancer Maternal Grandmother        female cancer ? type  . Colon cancer Maternal Grandfather   . Heart disease Maternal Aunt   . Heart attack Brother        x 3  . Prostate cancer Maternal Uncle   . Lung cancer Paternal Uncle   . Clotting disorder Brother     ALLERGIES:  is allergic to codeine and asa [aspirin].  MEDICATIONS:  Current Outpatient Medications  Medication Sig Dispense Refill  . albuterol (PROVENTIL HFA;VENTOLIN HFA) 108 (90 Base) MCG/ACT inhaler Inhale 2 puffs every 4 (four) hours as needed into the lungs for wheezing or shortness of breath (cough, shortness of breath or wheezing.). 1 Inhaler 1  . Coenzyme Q10 (COQ-10) 50 MG CAPS Take 50 mg by mouth daily.    Marland Kitchen  HYDROcodone-acetaminophen (NORCO/VICODIN) 5-325 MG tablet Take 1 tablet by mouth every 6 (six) hours as needed for moderate pain. 20 tablet 0  . ibuprofen (ADVIL,MOTRIN) 600 MG tablet Take 1 tablet (600 mg total) by mouth every 6 (six) hours as needed for mild pain, moderate pain or cramping. 30 tablet 0  . Omega-3 Fatty Acids (FISH OIL) 1200 MG CAPS Take 1,200 mg by mouth daily.    . ranitidine (ZANTAC) 150 MG tablet Take 1 tablet (150 mg total) by mouth 2 (two) times daily as needed for heartburn. 30 tablet 2   No current facility-administered medications for this visit.     REVIEW OF SYSTEMS:    10 Point review of Systems was done is negative except as noted above.  PHYSICAL EXAMINATION: ECOG PERFORMANCE STATUS: 0 - Asymptomatic  .  Vitals:   04/24/17 1001  BP: (!) 154/79  Pulse: 93  Resp: 18  Temp: 98.8 F (37.1 C)  SpO2: 99%   Filed Weights   04/24/17 1001  Weight: 211 lb 9.6 oz (96 kg)   .Body mass index is 36.32 kg/m.  GENERAL:alert, in no acute distress and comfortable SKIN: no acute rashes, no significant lesions EYES: conjunctiva are pink and non-injected, sclera anicteric OROPHARYNX: MMM, no exudates, no oropharyngeal erythema or ulceration NECK: supple, no JVD LYMPH:  no palpable lymphadenopathy in the cervical, axillary or inguinal regions LUNGS: clear to auscultation b/l with normal respiratory effort HEART: regular rate & rhythm ABDOMEN:  normoactive bowel sounds , non tender, not distended.no palpable hepatosplenomegaly Extremity: no pedal edema PSYCH: alert & oriented x 3 with fluent speech NEURO: no focal motor/sensory deficits  LABORATORY DATA:  I have reviewed the data as listed  . CBC Latest Ref Rng & Units 04/24/2017 03/20/2017 03/14/2017  WBC 3.9 - 10.3 K/uL 9.7 17.3(H) 8.4  Hemoglobin 12.0 - 15.0 g/dL - 11.5(L) 12.0  Hematocrit 34.8 - 46.6 % 40.0 35.3(L) 36.9  Platelets 145 - 400 K/uL 370 490(H) 499(H)  hgb 12.9 . CBC    Component Value  Date/Time   WBC 9.7 04/24/2017 1149   WBC 17.3 (H) 03/20/2017 0558   RBC 4.77 04/24/2017 1149   RBC 4.77 04/24/2017 1149   HGB 11.5 (L) 03/20/2017 0558   HGB 13.1 06/20/2016 0919   HCT 40.0 04/24/2017 1149   HCT 39.6 06/20/2016 0919   PLT 370 04/24/2017 1149   MCV 83.9 04/24/2017 1149   MCV 83 06/20/2016 0919   MCH 27.0 04/24/2017 1149   MCHC 32.3 04/24/2017 1149   RDW 14.5 04/24/2017 1149   RDW 16.0 (H) 06/20/2016 0919   LYMPHSABS 1.0 04/24/2017 1149   LYMPHSABS 2.6 06/20/2016 0919   MONOABS 0.6 04/24/2017 1149   EOSABS 0.2 04/24/2017 1149   EOSABS 0.1 06/20/2016 0919   BASOSABS 0.0 04/24/2017 1149   BASOSABS 0.0 06/20/2016 0919    . CMP Latest Ref Rng & Units 04/24/2017 03/14/2017 06/20/2016  Glucose 70 - 140 mg/dL 81 100(H) 92  BUN 7 - 26 mg/dL 6(L) 8 10  Creatinine 0.60 - 1.10 mg/dL 0.78 0.67 0.76  Sodium 136 - 145 mmol/L 139 135 139  Potassium 3.5 - 5.1 mmol/L 4.3 4.0 5.1  Chloride 98 - 109 mmol/L 103 103 104  CO2 22 - 29 mmol/L 26 26 28   Calcium 8.4 - 10.4 mg/dL 9.6 8.8(L) 9.6  Total Protein 6.4 - 8.3 g/dL 7.6 - 6.7  Total Bilirubin 0.2 - 1.2 mg/dL 0.5 - 0.4  Alkaline Phos 40 - 150 U/L 84 - 80  AST 5 - 34 U/L 16 - 13  ALT 0 - 55 U/L 14 - 11   Component     Latest Ref Rng & Units 04/24/2017  Iron     41 - 142 ug/dL 37 (L)  TIBC     236 - 444 ug/dL 325  Saturation Ratios     21 - 57 % 11 (L)  UIBC     ug/dL 288  Retic Ct Pct     0.7 - 2.1 % 1.7  RBC.     3.70 - 5.45 MIL/uL 4.77  Retic Count, Absolute     33.7 - 90.7 K/uL 81.1  Sed Rate     0 - 22 mm/hr 41 (H)  LDH     125 - 245 U/L 211  Ferritin  9 - 269 ng/mL 33    RADIOGRAPHIC STUDIES: I have personally reviewed the radiological images as listed and agreed with the findings in the report. No results found.  ASSESSMENT & PLAN:  62 y.o. female with   1. Leukocytosis ( no WBC was done previously- likely neutrophilia) Plan Discussed patient's most recent labs -Her WBC on 03/14/17 pre-surgery  were WNL at 8.4k, and her 03/20/17 elevated WBC were at 17.3k the day after her 1/15 surgery; suggestive of a reactive process. -rpt labs today show resolution of her leucocytosis with a normal WBC count of 9.7k with borderline neutrophilia. -normal LDH, no LNadenopathy to suggest lymphoproliferative process No splenomegaly to suggest MPN -SMear - no increased blasts -no further w/u for resolved leucocuytosis recommended  2. Thrombocytosis -Platelets have been elevated since 2016; last count on 04/01/17 at 513k. -Recent surgery and a history of heavy bleeding could be leading to her higher platelets, but given that her elevated platelet history goes back to at least 2016 - we ordered some clonal studies. - Platelets however has also normalized on labs today and are wnl - suggestive reactive process. No further w/u recommended  3. Acute Post-Surgery SOB -We advised that the pt call Dr. Mancel Bale today, and offered that we scan her chest and lungs for a blood clot. We counseled her on the strong importance of needing to investigating her acute SOB post-surgery to r/o blood clot. She explicitly denied wanting to do this; she will go to the ED if she develops CP, and prefers to wait one week until she sees Dr Mancel Bale to look into her acute SOB further.  Labs today RTC with Dr Irene Limbo as needed based on labs  All of the patients questions were answered with apparent satisfaction. The patient knows to call the clinic with any problems, questions or concerns.  . The total time spent in the appointment was 45 minutes and more than 50% was on counseling and direct patient cares.    Sullivan Lone MD [place] AAHIVMS [facility] Hematology/Oncology Physician [facility] [place]  (Office):       [phone] (Work cell):  [phone] (Fax):           [phone]  04/24/2017 9:48 AM  This document serves as a record of services personally performed by Sullivan Lone, MD. It was created on his behalf by  Baldwin Jamaica, a trained medical scribe. The creation of this record is based on the scribe's personal observations and the provider's statements to them.   .I have reviewed the above documentation for accuracy and completeness, and I agree with the above. Brunetta Genera MD MS

## 2017-04-24 ENCOUNTER — Inpatient Hospital Stay: Payer: BC Managed Care – PPO

## 2017-04-24 ENCOUNTER — Inpatient Hospital Stay: Payer: BC Managed Care – PPO | Attending: Hematology | Admitting: Hematology

## 2017-04-24 ENCOUNTER — Encounter: Payer: Self-pay | Admitting: Hematology

## 2017-04-24 VITALS — BP 154/79 | HR 93 | Temp 98.8°F | Resp 18 | Ht 64.0 in | Wt 211.6 lb

## 2017-04-24 DIAGNOSIS — Z90710 Acquired absence of both cervix and uterus: Secondary | ICD-10-CM | POA: Diagnosis not present

## 2017-04-24 DIAGNOSIS — D473 Essential (hemorrhagic) thrombocythemia: Secondary | ICD-10-CM | POA: Insufficient documentation

## 2017-04-24 DIAGNOSIS — Z9889 Other specified postprocedural states: Secondary | ICD-10-CM | POA: Insufficient documentation

## 2017-04-24 DIAGNOSIS — K219 Gastro-esophageal reflux disease without esophagitis: Secondary | ICD-10-CM | POA: Diagnosis not present

## 2017-04-24 DIAGNOSIS — Z803 Family history of malignant neoplasm of breast: Secondary | ICD-10-CM | POA: Diagnosis not present

## 2017-04-24 DIAGNOSIS — D72829 Elevated white blood cell count, unspecified: Secondary | ICD-10-CM | POA: Diagnosis not present

## 2017-04-24 DIAGNOSIS — Z800 Family history of malignant neoplasm of digestive organs: Secondary | ICD-10-CM | POA: Diagnosis not present

## 2017-04-24 DIAGNOSIS — E559 Vitamin D deficiency, unspecified: Secondary | ICD-10-CM | POA: Insufficient documentation

## 2017-04-24 DIAGNOSIS — R0602 Shortness of breath: Secondary | ICD-10-CM | POA: Diagnosis not present

## 2017-04-24 DIAGNOSIS — Z885 Allergy status to narcotic agent status: Secondary | ICD-10-CM | POA: Insufficient documentation

## 2017-04-24 DIAGNOSIS — D75839 Thrombocytosis, unspecified: Secondary | ICD-10-CM

## 2017-04-24 DIAGNOSIS — M545 Low back pain: Secondary | ICD-10-CM | POA: Insufficient documentation

## 2017-04-24 DIAGNOSIS — Z90722 Acquired absence of ovaries, bilateral: Secondary | ICD-10-CM | POA: Diagnosis not present

## 2017-04-24 DIAGNOSIS — Z886 Allergy status to analgesic agent status: Secondary | ICD-10-CM | POA: Diagnosis not present

## 2017-04-24 DIAGNOSIS — Z79899 Other long term (current) drug therapy: Secondary | ICD-10-CM | POA: Diagnosis not present

## 2017-04-24 LAB — CMP (CANCER CENTER ONLY)
ALBUMIN: 3.8 g/dL (ref 3.5–5.0)
ALK PHOS: 84 U/L (ref 40–150)
ALT: 14 U/L (ref 0–55)
ANION GAP: 10 (ref 3–11)
AST: 16 U/L (ref 5–34)
BILIRUBIN TOTAL: 0.5 mg/dL (ref 0.2–1.2)
BUN: 6 mg/dL — AB (ref 7–26)
CALCIUM: 9.6 mg/dL (ref 8.4–10.4)
CO2: 26 mmol/L (ref 22–29)
Chloride: 103 mmol/L (ref 98–109)
Creatinine: 0.78 mg/dL (ref 0.60–1.10)
GFR, Est AFR Am: >60 mL/min (ref >60)
GFR, Estimated: >60 mL/min (ref >60)
GLUCOSE: 81 mg/dL (ref 70–140)
Potassium: 4.3 mmol/L (ref 3.5–5.1)
Sodium: 139 mmol/L (ref 136–145)
TOTAL PROTEIN: 7.6 g/dL (ref 6.4–8.3)

## 2017-04-24 LAB — RETICULOCYTES
RBC.: 4.77 MIL/uL (ref 3.70–5.45)
RETIC CT PCT: 1.7 % (ref 0.7–2.1)
Retic Count, Absolute: 81.1 10*3/uL (ref 33.7–90.7)

## 2017-04-24 LAB — CBC WITH DIFFERENTIAL (CANCER CENTER ONLY)
BASOS PCT: 0 %
Basophils Absolute: 0 10*3/uL (ref 0.0–0.1)
EOS ABS: 0.2 10*3/uL (ref 0.0–0.5)
EOS PCT: 2 %
HCT: 40 % (ref 34.8–46.6)
Hemoglobin: 12.9 g/dL (ref 11.6–15.9)
Lymphocytes Relative: 11 %
Lymphs Abs: 1 10*3/uL (ref 0.9–3.3)
MCH: 27 pg (ref 25.1–34.0)
MCHC: 32.3 g/dL (ref 31.5–36.0)
MCV: 83.9 fL (ref 79.5–101.0)
MONO ABS: 0.6 10*3/uL (ref 0.1–0.9)
MONOS PCT: 7 %
Neutro Abs: 7.9 10*3/uL — ABNORMAL HIGH (ref 1.5–6.5)
Neutrophils Relative %: 80 %
Platelet Count: 370 10*3/uL (ref 145–400)
RBC: 4.77 MIL/uL (ref 3.70–5.45)
RDW: 14.5 % (ref 11.2–14.5)
WBC Count: 9.7 10*3/uL (ref 3.9–10.3)

## 2017-04-24 LAB — SEDIMENTATION RATE: Sed Rate: 41 mm/hr — ABNORMAL HIGH (ref 0–22)

## 2017-04-24 LAB — SAVE SMEAR

## 2017-04-24 LAB — IRON AND TIBC
Iron: 37 ug/dL — ABNORMAL LOW (ref 41–142)
SATURATION RATIOS: 11 % — AB (ref 21–57)
TIBC: 325 ug/dL (ref 236–444)
UIBC: 288 ug/dL

## 2017-04-24 LAB — LACTATE DEHYDROGENASE: LDH: 211 U/L (ref 125–245)

## 2017-04-24 LAB — FERRITIN: Ferritin: 33 ng/mL (ref 9–269)

## 2017-04-30 ENCOUNTER — Telehealth: Payer: Self-pay

## 2017-04-30 NOTE — Telephone Encounter (Signed)
Called pt and left VM on home phone in regards to lab work and recommendations per MD as noted below.

## 2017-04-30 NOTE — Telephone Encounter (Signed)
Pt called back to confirm information shared. Pt grateful for the lab results.

## 2017-04-30 NOTE — Telephone Encounter (Signed)
-----   Message from Brunetta Genera, MD sent at 04/29/2017 11:32 PM EST ----- Aldona Bar, Plz let patient know her WBC and platelets have normalized suggesting a reactive increase post-operativel. Unlike to be a primary Bone marrow problem. No further w/u recommended at this time regarding this. thx Sullivan Lone

## 2017-05-06 LAB — JAK2 (INCLUDING V617F AND EXON 12), MPL,& CALR-NEXT GEN SEQ

## 2017-05-10 ENCOUNTER — Ambulatory Visit: Payer: BC Managed Care – PPO | Admitting: Nurse Practitioner

## 2018-01-20 ENCOUNTER — Telehealth: Payer: Self-pay | Admitting: Internal Medicine

## 2018-01-20 NOTE — Telephone Encounter (Signed)
Copied from [place] [phone]. Topic: Quick Communication - See Telephone Encounter >> Jan 20, 2018  4:57 PM Rutherford Nail, NT wrote: CRM for notification. See Telephone encounter for: 01/20/18. Pharmacy calling and states that there is a recall on ranitidine (ZANTAC) 150 MG tablet. Would like to know if there is an alternative that can be sent in? Please advise. [place], [place] - [address]

## 2018-01-22 NOTE — Telephone Encounter (Signed)
LVM to inform patient she needs to set up an appointment with Dr.Burns to get a refill.   Also not understanding why her PCP was changed to Dr.Roberts.

## 2018-01-22 NOTE — Telephone Encounter (Signed)
I am not sure who her PCP is exactly but she needs to make an appointment with Dr. Quay Burow. She has not been seen in a while and has never had a CPE with her.

## 2018-01-24 ENCOUNTER — Emergency Department (HOSPITAL_COMMUNITY)
Admission: EM | Admit: 2018-01-24 | Discharge: 2018-01-24 | Disposition: A | Discharge status: Home / Self Care | Payer: BC Managed Care – PPO | Attending: Emergency Medicine | Admitting: Emergency Medicine

## 2018-01-24 ENCOUNTER — Encounter (HOSPITAL_COMMUNITY): Payer: Self-pay | Admitting: *Deleted

## 2018-01-24 ENCOUNTER — Other Ambulatory Visit: Payer: Self-pay

## 2018-01-24 ENCOUNTER — Emergency Department (HOSPITAL_COMMUNITY): Payer: BC Managed Care – PPO

## 2018-01-24 DIAGNOSIS — Z79899 Other long term (current) drug therapy: Secondary | ICD-10-CM | POA: Diagnosis not present

## 2018-01-24 DIAGNOSIS — R0602 Shortness of breath: Secondary | ICD-10-CM | POA: Diagnosis not present

## 2018-01-24 DIAGNOSIS — R079 Chest pain, unspecified: Secondary | ICD-10-CM | POA: Insufficient documentation

## 2018-01-24 LAB — CBC
HCT: 42.8 % (ref 36.0–46.0)
HEMOGLOBIN: 13.4 g/dL (ref 12.0–15.0)
MCH: 26.6 pg (ref 26.0–34.0)
MCHC: 31.3 g/dL (ref 30.0–36.0)
MCV: 85.1 fL (ref 80.0–100.0)
PLATELETS: 399 10*3/uL (ref 150–400)
RBC: 5.03 MIL/uL (ref 3.87–5.11)
RDW: 15 % (ref 11.5–15.5)
WBC: 12.2 10*3/uL — ABNORMAL HIGH (ref 4.0–10.5)
nRBC: 0 % (ref 0.0–0.2)

## 2018-01-24 LAB — BASIC METABOLIC PANEL
ANION GAP: 8 (ref 5–15)
BUN: 7 mg/dL — AB (ref 8–23)
CALCIUM: 9.3 mg/dL (ref 8.9–10.3)
CO2: 25 mmol/L (ref 22–32)
Chloride: 105 mmol/L (ref 98–111)
Creatinine, Ser: 0.8 mg/dL (ref 0.44–1.00)
GFR calc Af Amer: >60 mL/min (ref >60)
Glucose, Bld: 89 mg/dL (ref 70–99)
Potassium: 3.8 mmol/L (ref 3.5–5.1)
Sodium: 138 mmol/L (ref 135–145)

## 2018-01-24 LAB — I-STAT TROPONIN, ED
TROPONIN I, POC: 0 ng/mL (ref 0.00–0.08)
Troponin i, poc: 0.01 ng/mL (ref 0.00–0.08)

## 2018-01-24 LAB — D-DIMER, QUANTITATIVE: D-Dimer, Quant: 0.4 ug/mL-FEU (ref 0.00–0.50)

## 2018-01-24 MED ORDER — ONDANSETRON HCL 4 MG/2ML IJ SOLN
4.0000 mg | Freq: Once | INTRAMUSCULAR | Status: AC
  Administered 2018-01-24: 4 mg via INTRAVENOUS
  Filled 2018-01-24: qty 2

## 2018-01-24 MED ORDER — MORPHINE SULFATE (PF) 4 MG/ML IV SOLN
4.0000 mg | Freq: Once | INTRAVENOUS | Status: AC
  Administered 2018-01-24: 4 mg via INTRAVENOUS
  Filled 2018-01-24: qty 1

## 2018-01-24 NOTE — ED Provider Notes (Signed)
[facility] DEPART[facility] Provider Note   CSN: [id number] Arrival date & time: 01/24/18  1740     History   Chief Complaint Chief Complaint  Patient presents with  . Chest Pain    HPI Kelly Abbott is a 62 y.o. female.   Chest Pain   This is a new problem. Episode onset: about 4 pm. The problem occurs constantly. The pain is severe. The quality of the pain is described as sharp (deep aching). The pain radiates to the left arm and left neck. Duration of episode(s) is 4 hours. Associated symptoms include shortness of breath. Pertinent negatives include no cough and no lower extremity edema. The treatment provided no relief. Risk factors include smoking/tobacco exposure.  Pertinent negatives for past medical history include no MI and no PE.    Past Medical History:  Diagnosis Date  . Allergy   . Anxiety   . Bronchitis 10/2016  . Gastric ulcer   . GERD (gastroesophageal reflux disease)   . H/O measles   . H/O mumps   . Headache    history of migraines  . History of chicken pox   . Migraines   . Panic attacks 2005  . Shingles   . Vitamin D deficiency   . Wears partial dentures    upper partial    Patient Active Problem List   Diagnosis Date Noted  . Post-menopausal bleeding 03/19/2017  . Abnormal CBC 03/12/2017  . Menorrhagia with irregular cycle 03/12/2017  . 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 Gastroesophageal reflux disease with esophagitis 06/20/2016  . Abdominal pain, epigastric 06/20/2016  . Nausea without vomiting 06/20/2016  . Right shoulder pain 12/24/2014  . Elevated platelet count 11/17/2014  . RLQ abdominal pain 10/21/2014  . BMI 35.0-35.9,adult 09/02/2012  . Status post foot joint surgery 06/24/2012  . 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 Vitamin D deficiency   . Panic attacks     Past Surgical History:  Procedure Laterality Date  . APPENDECTOMY    . BUNIONECTOMY Right 05/01/12   [place],  right foot  . BUNIONECTOMY Left   . CHOLECYSTECTOMY N/A 03/18/2013   Procedure: LAPAROSCOPIC CHOLECYSTECTOMY;  Surgeon: Harl Bowie, MD;  Location: [facility];  Service: General;  Laterality: N/A;  . COLONOSCOPY    . CYSTOSCOPY N/A 03/19/2017   Procedure: CYSTOSCOPY;  Surgeon: Everett Graff, MD;  Location: [facility];  Service: Gynecology;  Laterality: N/A;  . DILATATION & CURRETTAGE/HYSTEROSCOPY WITH RESECTOCOPE N/A 09/07/2014   Procedure: [place];  Surgeon: Everett Graff, MD;  Location: [facility];  Service: Gynecology;  Laterality: N/A;  . ENDOMETRIAL BIOPSY  Rotator  . IUD REMOVAL    . LAPAROSCOPIC VAGINAL HYSTERECTOMY WITH SALPINGO OOPHORECTOMY Bilateral 03/19/2017   Procedure: LAPAROSCOPIC ASSISTED VAGINAL HYSTERECTOMY WITH SALPINGO OOPHORECTOMY;  Surgeon: Everett Graff, MD;  Location: [facility];  Service: Gynecology;  Laterality: Bilateral;  2.5 hours  . Remove Implant Deep Right 05/01/12/   Right foot  . ROTATOR CUFF REPAIR  02/09/2015   right  . TUBAL LIGATION    . UPPER GI ENDOSCOPY       OB History    Gravida  2   Para  1   Term  1   Preterm      AB      Living  1     SAB      TAB      Ectopic      Multiple  Live Births  1            Home Medications    Prior to Admission medications   Medication Sig Start Date End Date Taking? Authorizing Provider  albuterol (PROVENTIL HFA;VENTOLIN HFA) 108 (90 Base) MCG/ACT inhaler Inhale 2 puffs every 4 (four) hours as needed into the lungs for wheezing or shortness of breath (cough, shortness of breath or wheezing.). 01/15/17   Robyn Haber, MD  Coenzyme Q10 (COQ-10) 50 MG CAPS Take 50 mg by mouth daily.    [provider]  ibuprofen (ADVIL,MOTRIN) 600 MG tablet Take 1 tablet (600 mg total) by mouth every 6 (six) hours as needed for mild pain, moderate pain or cramping. 03/20/17   Everett Graff, MD  Omega-3 Fatty Acids (FISH OIL) 1200 MG  CAPS Take 1,200 mg by mouth daily.    [provider]  ranitidine (ZANTAC) 150 MG tablet Take 1 tablet (150 mg total) by mouth 2 (two) times daily as needed for heartburn. 03/12/17   Binnie Rail, MD    Family History Family History  Problem Relation Age of Onset  . Hypertension Mother   . Diabetes Mother   . Heart disease Sister   . Hypertension Sister   . Hypertension Brother   . Cancer Maternal Grandmother        female cancer ? type  . Colon cancer Maternal Grandfather   . Heart disease Maternal Aunt   . Heart attack Brother        x 3  . Prostate cancer Maternal Uncle   . Lung cancer Paternal Uncle   . Clotting disorder Brother     Social History Social History   Tobacco Use  . Smoking status: Never Smoker  . Smokeless tobacco: Never Used  Substance Use Topics  . Alcohol use: No  . Drug use: No     Allergies   Codeine and Asa [aspirin]   Review of Systems Review of Systems  Respiratory: Positive for shortness of breath. Negative for cough.   Cardiovascular: Positive for chest pain.  All other systems reviewed and are negative.    Physical Exam Updated Vital Signs BP 140/73   Pulse 70   Temp 98.1 F (36.7 C) (Oral)   Resp 16   LMP 08/30/2014 (Exact Date)   SpO2 99%   Physical Exam  Constitutional: She appears well-developed and well-nourished. No distress.  HENT:  Head: Normocephalic and atraumatic.  Right Ear: External ear normal.  Left Ear: External ear normal.  Eyes: Conjunctivae are normal. Right eye exhibits no discharge. Left eye exhibits no discharge. No scleral icterus.  Neck: Neck supple. No tracheal deviation present.  Cardiovascular: Normal rate, regular rhythm and intact distal pulses.  Pulmonary/Chest: Effort normal and breath sounds normal. No stridor. No respiratory distress. She has no wheezes. She has no rales.  Chest wall ttp  Abdominal: Soft. Bowel sounds are normal. She exhibits no distension. There is no tenderness.  There is no rebound and no guarding.  Musculoskeletal: She exhibits no edema or tenderness.  Neurological: She is alert. She has normal strength. No cranial nerve deficit (no facial droop, extraocular movements intact, no slurred speech) or sensory deficit. She exhibits normal muscle tone. She displays no seizure activity. Coordination normal.  Skin: Skin is warm and dry. No rash noted.  Psychiatric: She has a normal mood and affect.  Nursing note and vitals reviewed.    ED Treatments / Results  Labs (all labs ordered are listed, but only  abnormal results are displayed) Labs Reviewed  BASIC METABOLIC PANEL - Abnormal; Notable for the following components:      Result Value   BUN 7 (*)    All other components within normal limits  CBC - Abnormal; Notable for the following components:   WBC 12.2 (*)    All other components within normal limits  D-DIMER, QUANTITATIVE (NOT AT [facility])  I-STAT TROPONIN, ED  I-STAT TROPONIN, ED    EKG EKG Interpretation  Date/Time:  Friday January 24 2018 17:54:01 EST Ventricular Rate:  73 PR Interval:  152 QRS Duration: 70 QT Interval:  338 QTC Calculation: 372 R Axis:   50 Text Interpretation:  Normal sinus rhythm Nonspecific T wave abnormality Abnormal ECG No significant change since last tracing Confirmed by Dorie Rank [phone]) on 01/24/2018 8:18:25 PM   Radiology Dg Chest 2 View  Result Date: 01/24/2018 CLINICAL DATA:  Sudden onset LEFT-sided chest pain and shortness of breath. EXAM: CHEST - 2 VIEW COMPARISON:  Chest x-ray dated 08/30/2014. FINDINGS: Cardiomediastinal silhouette is within normal limits in size and configuration. Lungs are clear. Lung volumes are normal. No evidence of pneumonia. No pleural effusion. No pneumothorax seen. Osseous structures about the chest are unremarkable. IMPRESSION: No active cardiopulmonary disease. Electronically Signed   By: Franki Cabot M.D.   On: 01/24/2018 19:04    Procedures Procedures (including  critical care time)  Medications Ordered in ED Medications  morphine 4 MG/ML injection 4 mg (4 mg Intravenous Given 01/24/18 2030)  ondansetron (ZOFRAN) injection 4 mg (4 mg Intravenous Given 01/24/18 2030)     Initial Impression / Assessment and Plan / ED Course  I have reviewed the triage vital signs and the nursing notes.  Pertinent labs & imaging results that were available during my care of the patient were reviewed by me and considered in my medical decision making (see chart for details).   Patient presented to the emergency room for evaluation of chest pain.  Symptoms are atypical in nature.  Patient has a low risk heart score.  I doubt acute coronary syndrome.  Serial troponins are normal.  Patient is low risk for pulmonary embolism and her d-dimer is negative.  I doubt PE.  [name] ED work-up is reassuring.  No clear etiology for her pain but appears stable for outpatient follow-up.  Final Clinical Impressions(s) / ED Diagnoses   Final diagnoses:  Chest pain, unspecified type    ED Discharge Orders    None       Dorie Rank, MD 01/24/18 2128

## 2018-01-24 NOTE — ED Notes (Signed)
Patient Alert and oriented to baseline. Stable and ambulatory to baseline. Patient verbalized understanding of the discharge instructions.  Patient belongings were taken by the patient.   

## 2018-01-24 NOTE — ED Triage Notes (Signed)
Pt reports onset approx 1 hour ago of left side chest pain that radiates into left arm. Pain increases with movement. ekg done and no acute distress is noted at this time.

## 2018-01-24 NOTE — Discharge Instructions (Signed)
Follow-up with your primary care doctor next week, return to the emergency room if you have any recurrent episodes

## 2019-05-14 ENCOUNTER — Ambulatory Visit: Payer: BC Managed Care – PPO | Attending: Family

## 2019-05-14 DIAGNOSIS — Z23 Encounter for immunization: Secondary | ICD-10-CM

## 2019-05-14 NOTE — Progress Notes (Signed)
   Covid-19 Vaccination Clinic  Name:  Kelly Abbott    MRN: [id number] DOB: 11/23/1955  05/14/2019  Kelly Abbott was observed post Covid-19 immunization for 15 minutes without incident. She was provided with Vaccine Information Sheet and instruction to access the V-Safe system.   Kelly Abbott was instructed to call 911 with any severe reactions post vaccine: Marland Kitchen Difficulty breathing  . Swelling of face and throat  . A fast heartbeat  . A bad rash all over body  . Dizziness and weakness   Immunizations Administered    Name Date Dose VIS Date Route   Moderna COVID-19 Vaccine 05/14/2019 11:16 AM 0.5 mL 02/03/2019 Intramuscular   Manufacturer: Moderna   Lot: [id number]   [id number]

## 2019-06-16 ENCOUNTER — Ambulatory Visit: Payer: BC Managed Care – PPO

## 2019-06-23 ENCOUNTER — Ambulatory Visit: Payer: BC Managed Care – PPO | Attending: Family

## 2019-06-23 DIAGNOSIS — Z23 Encounter for immunization: Secondary | ICD-10-CM

## 2019-06-23 NOTE — Progress Notes (Signed)
   Covid-19 Vaccination Clinic  Name:  Kelly Abbott    MRN: [id number] DOB: 1956/02/03  06/23/2019  [name] was observed post Covid-19 immunization for 15 minutes without incident. She was provided with Vaccine Information Sheet and instruction to access the V-Safe system.   [name] was instructed to call 911 with any severe reactions post vaccine: Marland Kitchen Difficulty breathing  . Swelling of face and throat  . A fast heartbeat  . A bad rash all over body  . Dizziness and weakness   Immunizations Administered    Name Date Dose VIS Date Route   Moderna COVID-19 Vaccine 06/23/2019 12:36 PM 0.5 mL 02/2019 Intramuscular   Manufacturer: Moderna   Lot: [id number]   [place]:[id number]

## 2020-01-06 NOTE — Telephone Encounter (Signed)
She was never my patient - I saw her once after Kelly Abbott left.  I am not accepting new patients and she will need to establish with a new pcp.

## 2020-01-06 NOTE — Telephone Encounter (Signed)
    Left message to advise patient Dr Quay Burow unable to be PCP

## 2020-01-06 NOTE — Telephone Encounter (Signed)
   Unclear if this truly is Dr Quay Burow patient.  Patient last seen 2019 [facility] [place] patient  Dr Quay Burow are you okay with seeing patient?

## 2020-01-20 ENCOUNTER — Ambulatory Visit: Payer: BC Managed Care – PPO | Attending: Family

## 2020-01-20 DIAGNOSIS — Z23 Encounter for immunization: Secondary | ICD-10-CM

## 2020-04-22 NOTE — Progress Notes (Signed)
   Covid-19 Vaccination Clinic  Name:  Kelly Abbott    MRN: [id number] DOB: 05-28-1955  04/22/2020  Kelly Abbott was observed post Covid-19 immunization for 15 minutes without incident. She was provided with Vaccine Information Sheet and instruction to access the V-Safe system.   Kelly Abbott was instructed to call 911 with any severe reactions post vaccine: Marland Kitchen Difficulty breathing  . Swelling of face and throat  . A fast heartbeat  . A bad rash all over body  . Dizziness and weakness   Immunizations Administered    Name Date Dose VIS Date Route   Moderna Covid-19 Booster Vaccine 01/20/2020  9:00 AM 0.25 mL 12/23/2019 Intramuscular   Manufacturer: Moderna   Lot: [id number]   [place]: [phone]

## 2020-06-12 ENCOUNTER — Encounter (HOSPITAL_COMMUNITY): Payer: Self-pay | Admitting: Emergency Medicine

## 2020-06-12 ENCOUNTER — Emergency Department (HOSPITAL_COMMUNITY)
Admission: EM | Admit: 2020-06-12 | Discharge: 2020-06-12 | Discharge status: Against Medical Advice | Payer: BC Managed Care – PPO | Attending: Emergency Medicine | Admitting: Emergency Medicine

## 2020-06-12 ENCOUNTER — Other Ambulatory Visit: Payer: Self-pay

## 2020-06-12 ENCOUNTER — Emergency Department (HOSPITAL_COMMUNITY): Payer: BC Managed Care – PPO

## 2020-06-12 ENCOUNTER — Ambulatory Visit (HOSPITAL_COMMUNITY)
Admission: EM | Admit: 2020-06-12 | Discharge: 2020-06-12 | Disposition: A | Discharge status: Home / Self Care | Payer: BC Managed Care – PPO

## 2020-06-12 DIAGNOSIS — R531 Weakness: Secondary | ICD-10-CM

## 2020-06-12 DIAGNOSIS — R42 Dizziness and giddiness: Secondary | ICD-10-CM | POA: Diagnosis not present

## 2020-06-12 DIAGNOSIS — R519 Headache, unspecified: Secondary | ICD-10-CM | POA: Insufficient documentation

## 2020-06-12 DIAGNOSIS — H538 Other visual disturbances: Secondary | ICD-10-CM | POA: Diagnosis not present

## 2020-06-12 LAB — CBC
HCT: 42.2 % (ref 36.0–46.0)
Hemoglobin: 13.4 g/dL (ref 12.0–15.0)
MCH: 27.5 pg (ref 26.0–34.0)
MCHC: 31.8 g/dL (ref 30.0–36.0)
MCV: 86.7 fL (ref 80.0–100.0)
Platelets: 424 10*3/uL — ABNORMAL HIGH (ref 150–400)
RBC: 4.87 MIL/uL (ref 3.87–5.11)
RDW: 14.9 % (ref 11.5–15.5)
WBC: 10.9 10*3/uL — ABNORMAL HIGH (ref 4.0–10.5)
nRBC: 0 % (ref 0.0–0.2)

## 2020-06-12 LAB — COMPREHENSIVE METABOLIC PANEL
ALT: 29 U/L (ref 0–44)
AST: 20 U/L (ref 15–41)
Albumin: 3.6 g/dL (ref 3.5–5.0)
Alkaline Phosphatase: 67 U/L (ref 38–126)
Anion gap: 5 (ref 5–15)
BUN: 7 mg/dL — ABNORMAL LOW (ref 8–23)
CO2: 28 mmol/L (ref 22–32)
Calcium: 9.2 mg/dL (ref 8.9–10.3)
Chloride: 104 mmol/L (ref 98–111)
Creatinine, Ser: 0.74 mg/dL (ref 0.44–1.00)
GFR, Estimated: >60 mL/min (ref >60)
Glucose, Bld: 97 mg/dL (ref 70–99)
Potassium: 3.8 mmol/L (ref 3.5–5.1)
Sodium: 137 mmol/L (ref 135–145)
Total Bilirubin: 0.6 mg/dL (ref 0.3–1.2)
Total Protein: 7.1 g/dL (ref 6.5–8.1)

## 2020-06-12 LAB — DIFFERENTIAL
Abs Immature Granulocytes: 0.04 10*3/uL (ref 0.00–0.07)
Basophils Absolute: 0 10*3/uL (ref 0.0–0.1)
Basophils Relative: 0 %
Eosinophils Absolute: 0.1 10*3/uL (ref 0.0–0.5)
Eosinophils Relative: 1 %
Immature Granulocytes: 0 %
Lymphocytes Relative: 25 %
Lymphs Abs: 2.7 10*3/uL (ref 0.7–4.0)
Monocytes Absolute: 0.5 10*3/uL (ref 0.1–1.0)
Monocytes Relative: 5 %
Neutro Abs: 7.5 10*3/uL (ref 1.7–7.7)
Neutrophils Relative %: 69 %

## 2020-06-12 LAB — PROTIME-INR
INR: 1 (ref 0.8–1.2)
Prothrombin Time: 13.2 seconds (ref 11.4–15.2)

## 2020-06-12 LAB — APTT: aPTT: 30 seconds (ref 24–36)

## 2020-06-12 MED ORDER — SODIUM CHLORIDE 0.9% FLUSH
3.0000 mL | Freq: Once | INTRAVENOUS | Status: DC
Start: 2020-06-12 — End: 2020-06-13

## 2020-06-12 NOTE — ED Notes (Signed)
Patient is resting comfortably. 

## 2020-06-12 NOTE — ED Notes (Signed)
Patient transported to MRI 

## 2020-06-12 NOTE — ED Notes (Signed)
Patient is back from MRI  

## 2020-06-12 NOTE — ED Notes (Signed)
Vital signs stable. 

## 2020-06-12 NOTE — ED Provider Notes (Signed)
[facility] EMERGENCY DEPARTMENT Provider Note   CSN: [id number] Arrival date & time: 06/12/20  1501     History Chief Complaint  Patient presents with  . Weakness  . Blurred Vision    Kelly Abbott is a 65 y.o. female who presents to the emergency department with concern for headache and visual disturbance since Friday of progressively worsening right upper and lower extremity numbness sensation and weakness.  Additionally patient endorses intermittent unsteadiness on her feet and dizziness.  Patient without history of cardiac disease, she is not on any medications every day.  She is an otherwise healthy 65 year old female.  She denies any fevers or chills, denies head trauma or syncope.  Denies nausea, vomiting, diarrhea.  I personally reviewed this patient's medical records.  She does have history of migraines in the past, history of GERD as well.   HPI     Past Medical History:  Diagnosis Date  . Allergy   . Anxiety   . Bronchitis 10/2016  . Gastric ulcer   . GERD (gastroesophageal reflux disease)   . H/O measles   . H/O mumps   . Headache    history of migraines  . History of chicken pox   . Migraines   . Panic attacks 2005  . Shingles   . Vitamin D deficiency   . Wears partial dentures    upper partial    Patient Active Problem List   Diagnosis Date Noted  . Post-menopausal bleeding 03/19/2017  . Abnormal CBC 03/12/2017  . Menorrhagia with irregular cycle 03/12/2017  . 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 Gastroesophageal reflux disease with esophagitis 06/20/2016  . Abdominal pain, epigastric 06/20/2016  . Nausea without vomiting 06/20/2016  . Right shoulder pain 12/24/2014  . Elevated platelet count 11/17/2014  . RLQ abdominal pain 10/21/2014  . BMI 35.0-35.9,adult 09/02/2012  . Status post foot joint surgery 06/24/2012  . 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 Vitamin D deficiency   . Panic attacks      Past Surgical History:  Procedure Laterality Date  . APPENDECTOMY    . BUNIONECTOMY Right 05/01/12   [place], right foot  . BUNIONECTOMY Left   . CHOLECYSTECTOMY N/A 03/18/2013   Procedure: LAPAROSCOPIC CHOLECYSTECTOMY;  Surgeon: Harl Bowie, MD;  Location: [facility];  Service: General;  Laterality: N/A;  . COLONOSCOPY    . CYSTOSCOPY N/A 03/19/2017   Procedure: CYSTOSCOPY;  Surgeon: Everett Graff, MD;  Location: [facility];  Service: Gynecology;  Laterality: N/A;  . DILATATION & CURRETTAGE/HYSTEROSCOPY WITH RESECTOCOPE N/A 09/07/2014   Procedure: [place];  Surgeon: Everett Graff, MD;  Location: [facility];  Service: Gynecology;  Laterality: N/A;  . ENDOMETRIAL BIOPSY  Rotator  . IUD REMOVAL    . LAPAROSCOPIC VAGINAL HYSTERECTOMY WITH SALPINGO OOPHORECTOMY Bilateral 03/19/2017   Procedure: LAPAROSCOPIC ASSISTED VAGINAL HYSTERECTOMY WITH SALPINGO OOPHORECTOMY;  Surgeon: Everett Graff, MD;  Location: [facility];  Service: Gynecology;  Laterality: Bilateral;  2.5 hours  . Remove Implant Deep Right 05/01/12/   Right foot  . ROTATOR CUFF REPAIR  02/09/2015   right  . TUBAL LIGATION    . UPPER GI ENDOSCOPY       OB History    Gravida  2   Para  1   Term  1   Preterm      AB      Living  1     SAB      IAB  Ectopic      Multiple      Live Births  1           Family History  Problem Relation Age of Onset  . Hypertension Mother   . Diabetes Mother   . Heart disease Sister   . Hypertension Sister   . Hypertension Brother   . Cancer Maternal Grandmother        female cancer ? type  . Colon cancer Maternal Grandfather   . Heart disease Maternal Aunt   . Heart attack Brother        x 3  . Prostate cancer Maternal Uncle   . Lung cancer Paternal Uncle   . Clotting disorder Brother     Social History   Tobacco Use  . Smoking status: Never Smoker  . Smokeless tobacco: Never Used  Vaping Use  .  Vaping Use: Never used  Substance Use Topics  . Alcohol use: No  . Drug use: No    Home Medications Prior to Admission medications   Medication Sig Start Date End Date Taking? Authorizing Provider  albuterol (PROVENTIL HFA;VENTOLIN HFA) 108 (90 Base) MCG/ACT inhaler Inhale 2 puffs every 4 (four) hours as needed into the lungs for wheezing or shortness of breath (cough, shortness of breath or wheezing.). 01/15/17   Robyn Haber, MD  Coenzyme Q10 (COQ-10) 50 MG CAPS Take 50 mg by mouth daily.    [provider]  ibuprofen (ADVIL,MOTRIN) 600 MG tablet Take 1 tablet (600 mg total) by mouth every 6 (six) hours as needed for mild pain, moderate pain or cramping. 03/20/17   Everett Graff, MD  Omega-3 Fatty Acids (FISH OIL) 1200 MG CAPS Take 1,200 mg by mouth daily.    [provider]  ranitidine (ZANTAC) 150 MG tablet Take 1 tablet (150 mg total) by mouth 2 (two) times daily as needed for heartburn. 03/12/17   Binnie Rail, MD    Allergies    Codeine and Asa [aspirin]  Review of Systems   Review of Systems  Constitutional: Negative.   HENT: Positive for ear discharge. Negative for trouble swallowing and voice change.   Cardiovascular: Negative.   Neurological: Positive for weakness and headaches. Negative for syncope and speech difficulty.    Physical Exam Updated Vital Signs BP 139/74 (BP Location: Right Arm)   Pulse 72   Temp 98.4 F (36.9 C) (Oral)   Resp 18   Ht 5\' 6"  (1.676 m)   Wt 96 kg   LMP 08/30/2014 (Exact Date)   SpO2 100%   BMI 34.16 kg/m   Physical Exam Vitals and nursing note reviewed.  Constitutional:      General: She is awake.     Appearance: She is overweight. She is not toxic-appearing.  HENT:     Head: Normocephalic and atraumatic.     Right Ear: Tympanic membrane normal. No drainage.     Left Ear: Tympanic membrane normal. No drainage.     Nose: Nose normal.     Mouth/Throat:     Mouth: Mucous membranes are moist.     Pharynx:  Oropharynx is clear. Uvula midline. No oropharyngeal exudate or posterior oropharyngeal erythema.     Tonsils: No tonsillar exudate.  Eyes:     General: Lids are normal. Vision grossly intact.        Right eye: No discharge.        Left eye: No discharge.     Extraocular Movements:     Right  eye: Nystagmus present.     Conjunctiva/sclera: Conjunctivae normal.     Pupils: Pupils are equal, round, and reactive to light.     Comments: Some vertical nystagmus with rightward gaze  Neck:     Trachea: Trachea and phonation normal.  Cardiovascular:     Rate and Rhythm: Normal rate and regular rhythm.     Pulses: Normal pulses.     Heart sounds: Normal heart sounds. No murmur heard.   Pulmonary:     Effort: Pulmonary effort is normal. No tachypnea, bradypnea, accessory muscle usage or respiratory distress.     Breath sounds: Normal breath sounds. No wheezing or rales.  Chest:     Chest wall: No mass, lacerations, deformity, swelling, tenderness or crepitus.  Abdominal:     General: Bowel sounds are normal. There is no distension.     Palpations: Abdomen is soft.     Tenderness: There is no abdominal tenderness.  Musculoskeletal:        General: No deformity.     Cervical back: Normal range of motion and neck supple. No edema, rigidity or crepitus. No pain with movement, spinous process tenderness or muscular tenderness.     Right lower leg: No edema.     Left lower leg: No edema.  Lymphadenopathy:     Cervical: No cervical adenopathy.  Skin:    General: Skin is warm and dry.  Neurological:     Mental Status: She is alert and oriented to person, place, and time. Mental status is at baseline.     Cranial Nerves: Cranial nerves are intact.     Sensory: Sensation is intact.     Motor: Weakness present. No pronator drift.     Coordination: Romberg sign positive.     Gait: Gait is intact.     Comments: Slight delay in right sided finger to nose test, and fine motor coordination in the  right hand.  5/5 grip strength in left, 4/5 in right hand 5/5 strength in elbow flexion extension in the left hand, 4/5 on the right. 5/5 plantar dorsiflexion left foot, 3/5 in the right 5/5 strength in knee flexion extension on the left 3/5 on the right. Negative pronator drift in the right hand though she did have difficulty maintaining her arm in the air on the right side.   Psychiatric:        Mood and Affect: Mood normal.     ED Results / Procedures / Treatments   Labs (all labs ordered are listed, but only abnormal results are displayed) Labs Reviewed  CBC - Abnormal; Notable for the following components:      Result Value   WBC 10.9 (*)    Platelets 424 (*)    All other components within normal limits  COMPREHENSIVE METABOLIC PANEL - Abnormal; Notable for the following components:   BUN 7 (*)    All other components within normal limits  PROTIME-INR  APTT  DIFFERENTIAL    EKG EKG: normal sinus rhythm, no STEMI.   Radiology CT HEAD WO CONTRAST  Result Date: 06/12/2020 CLINICAL DATA:  Headache with blurry vision and right ear bleeding. EXAM: CT HEAD WITHOUT CONTRAST TECHNIQUE: Contiguous axial images were obtained from the base of the skull through the vertex without intravenous contrast. COMPARISON:  None. FINDINGS: Brain: There is no evidence for acute hemorrhage, hydrocephalus, mass lesion, or abnormal extra-axial fluid collection. No definite CT evidence for acute infarction. Vascular: No hyperdense vessel or unexpected calcification. Skull: No evidence for fracture.  No worrisome lytic or sclerotic lesion. Sinuses/Orbits: The visualized paranasal sinuses and mastoid air cells are clear. Visualized portions of the globes and intraorbital fat are unremarkable. Other: None. IMPRESSION: No acute intracranial abnormality. Electronically Signed   By: Misty Stanley M.D.   On: 06/12/2020 17:04   MR BRAIN WO CONTRAST  Result Date: 06/12/2020 CLINICAL DATA:  Acute neuro  deficit.  Headache right-sided weakness EXAM: MRI HEAD WITHOUT CONTRAST TECHNIQUE: Multiplanar, multiecho pulse sequences of the brain and surrounding structures were obtained without intravenous contrast. COMPARISON:  CT head 06/12/2020 FINDINGS: Brain: Ventricle size and cerebral volume normal. Few small deep white matter hyperintensities bilaterally, mild in degree. Negative for acute infarct, hemorrhage, mass. Vascular: Normal arterial flow voids Skull and upper cervical spine: No focal skeletal abnormality. Sinuses/Orbits: Negative Other: None IMPRESSION: No acute abnormality. Few small white matter hyperintensities consistent with chronic small vessel ischemia. Electronically Signed   By: Franchot Gallo M.D.   On: 06/12/2020 19:02    Procedures Procedures   Medications Ordered in ED Medications - No data to display  ED Course  I have reviewed the triage vital signs and the nursing notes.  Pertinent labs & imaging results that were available during my care of the patient were reviewed by me and considered in my medical decision making (see chart for details).  Clinical Course as of 06/13/20 0120  Sun Jun 12, 2020  1954 I was informed by this patient's RN that she was requesting to leave [facility].  I did discuss with her the risks of doing so without having completed her work-up and my discussion with the specialist.  She voiced understanding and was agreeable to staying in the emergency department while awaiting consult. [RS]  2019 Consult call returned by Dr. Curly Shores, neurologist, who feel this patient is safe to be discharged home at this time with outpatient neurologic follow-up.  Will likely require outpatient MRI of the C-spine as well.  I appreciate her collaboration care of this patient. [RS]  2020 I completed my phone call with the neurologist and was informed by this patient's RN that she had left the department after signing an [place] form.  [RS]    Clinical Course User  Index [RS] Dnyla Antonetti, Sharlene Dory   MDM Rules/Calculators/A&P                         65 year old female with history of progressively worsening right-sided upper and lower extremity weakness and aching for the last 3 days with associated blurriness of vision and intermittent dizziness.  Differential diagnosis for this patient symptoms includes but is not limited to CVA, intracranial mass or hemorrhage, spinal nerve compression or cord impingement, meningitis, complex migraine, cerebral sinus thrombosis, transverse myelitis or epidural abscess/hematoma.  Mildly hypertensive on intake, vital signs otherwise normal.  Cardiopulmonary exam is normal, abdominal exam is benign.  Unfortunately neurologic exam did have some right upper and lower extremity weakness relative to the left extremities..  Additionally delay in coordination on the right side.  CBC with mild leukocytosis of 10.9.  CMP unremarkable, coag studies unremarkable.  EKG reassuring.  CT of the head negative for acute intercranial abnormality.  Given concerning deficits on neurologic exam we will proceed with MRI of the brain at this time.  Neurology consult placed.  Unfortunately there is a delay in reception of neurology consult due to high acuity situation on their service.  The patient expressed wishes to leave the emergency department  while awaiting MRI results in neurologic consultation.  Extensive discussion took place between this provider and the patient regarding risks of leaving the emergency department prior to completing her work-up and specialist consultation; she expressed wishes to leave [facility], however was able to be redirected and decided to stay in the emergency department to await neurologic consultation.   MRI of the brain additionally was negative for any acute abnormality.  Did receive consultation from Dr. Curly Shores, who offered recommendations that patient's work-up was very reassuring in the  emergency department at this time and she did not require admission to the hospital for further work-up.  She did recommend close outpatient neurologic follow-up.    Unfortunately before I could discuss reassuring specialist recommendations with the patient, as well as follow-up recommendations, I was informed by RN that the patient had expressed dissatisfaction with her care in our emergency department and had left the [facility] after removing her own intravenous catheters.    She is welcome to return to the ED at any time.  This chart was dictated using voice recognition software, Dragon. Despite the best efforts of this provider to proofread and correct errors, errors may still occur which can change documentation meaning.   Final Clinical Impression(s) / ED Diagnoses Final diagnoses:  None    Rx / DC Orders ED Discharge Orders    None       Aura Dials 06/13/20 0120    Daleen Bo, MD 06/13/20 1109

## 2020-06-12 NOTE — ED Notes (Signed)
Patient transported to CT 

## 2020-06-12 NOTE — ED Triage Notes (Signed)
Pt reports blurred vision with HA since Friday.Pt also has bil. Blurred vision and RT arm pain.

## 2020-06-12 NOTE — ED Notes (Signed)
Pt transported to MRI 

## 2020-06-12 NOTE — ED Notes (Signed)
R L ,NP at pt bedside . Pt referred to ED for Sx's

## 2020-06-12 NOTE — ED Notes (Signed)
Patient got mad bc this RN didn't write her name on the board and tried getting my name to report me because I was with my critical patient and not taking care of her.  Patient was rude to numerous nurses.  Patient left AMA and pulled own IV's and stated "if she can't handle a critical patient and the rest of her patients she doesn't need to be on this unit".  Patient then stated "I dont even come here I go to [facility]". Patient ambulatory to waiting room with steady gait and alert and oriented x 4

## 2020-06-12 NOTE — ED Notes (Signed)
Patient immediately came out and was rude talking about take these out (meaning IVs) so I can just leave.  Patient then begins to say Ive asked for water and to use the bathroom and no one has come in.  Tried to explain shift change and asked about her signing the waiver and patient said she didn't know and that "you have aids that is what they are for".  Tried to explain we have had some critical patients and patient was continuing to be rude.

## 2020-06-12 NOTE — ED Triage Notes (Addendum)
Pt reports bleeding from R ear on Friday.  Reports "tingling headache" (frontal), blurred vision (bilateral eyes) , and R arm numbness and weakness since Friday.  No arm drift.

## 2020-06-12 NOTE — ED Notes (Signed)
Patient removed her own IV.

## 2020-06-21 ENCOUNTER — Ambulatory Visit (INDEPENDENT_AMBULATORY_CARE_PROVIDER_SITE_OTHER): Payer: BC Managed Care – PPO | Admitting: Otolaryngology

## 2020-06-21 ENCOUNTER — Encounter (INDEPENDENT_AMBULATORY_CARE_PROVIDER_SITE_OTHER): Payer: Self-pay | Admitting: Otolaryngology

## 2020-06-21 ENCOUNTER — Other Ambulatory Visit: Payer: Self-pay

## 2020-06-21 VITALS — Temp 96.4°F

## 2020-06-21 DIAGNOSIS — J029 Acute pharyngitis, unspecified: Secondary | ICD-10-CM | POA: Diagnosis not present

## 2020-06-21 NOTE — Progress Notes (Signed)
HPI: Kelly Abbott is a 65 y.o. female who presents for evaluation of history of recurrent sore throats over the past year.  Presently she is doing much better.  A couple weeks ago she had acute bleeding from her right ear following a hot shower.  She was seen in the ED where she had scans of her head thinking that she might of had a stroke.  The scans were negative.  She apparently had some weakness of some extremity motion while seen in the ED and hence the thought that she might of had a stroke. She has had no further bleeding.  She has used Q-tips in the past.  She feels like her hearing is normal..  Past Medical History:  Diagnosis Date  . Allergy   . Anxiety   . Bronchitis 10/2016  . Gastric ulcer   . GERD (gastroesophageal reflux disease)   . H/O measles   . H/O mumps   . Headache    history of migraines  . History of chicken pox   . Migraines   . Panic attacks 2005  . Shingles   . Vitamin D deficiency   . Wears partial dentures    upper partial   Past Surgical History:  Procedure Laterality Date  . APPENDECTOMY    . BUNIONECTOMY Right 05/01/12   [place], right foot  . BUNIONECTOMY Left   . CHOLECYSTECTOMY N/A 03/18/2013   Procedure: LAPAROSCOPIC CHOLECYSTECTOMY;  Surgeon: Harl Bowie, MD;  Location: [facility];  Service: General;  Laterality: N/A;  . COLONOSCOPY    . CYSTOSCOPY N/A 03/19/2017   Procedure: CYSTOSCOPY;  Surgeon: Everett Graff, MD;  Location: [facility];  Service: Gynecology;  Laterality: N/A;  . DILATATION & CURRETTAGE/HYSTEROSCOPY WITH RESECTOCOPE N/A 09/07/2014   Procedure: [place];  Surgeon: Everett Graff, MD;  Location: [facility];  Service: Gynecology;  Laterality: N/A;  . ENDOMETRIAL BIOPSY  Rotator  . IUD REMOVAL    . LAPAROSCOPIC VAGINAL HYSTERECTOMY WITH SALPINGO OOPHORECTOMY Bilateral 03/19/2017   Procedure: LAPAROSCOPIC ASSISTED VAGINAL HYSTERECTOMY WITH SALPINGO OOPHORECTOMY;   Surgeon: Everett Graff, MD;  Location: [facility];  Service: Gynecology;  Laterality: Bilateral;  2.5 hours  . Remove Implant Deep Right 05/01/12/   Right foot  . ROTATOR CUFF REPAIR  02/09/2015   right  . TUBAL LIGATION    . UPPER GI ENDOSCOPY     Social History   Socioeconomic History  . Marital status: Married    Spouse name: Not on file  . Number of children: 1  . Years of education: 81  . Highest education level: Not on file  Occupational History  . Occupation: Agricultural engineer    Comment: Personnel officer  Tobacco Use  . Smoking status: Never Smoker  . Smokeless tobacco: Never Used  Vaping Use  . Vaping Use: Never used  Substance and Sexual Activity  . Alcohol use: No  . Drug use: No  . Sexual activity: Yes    Birth control/protection: None  Other Topics Concern  . Not on file  Social History Narrative   Fun: Bowling, shopping, workout   Denies religious beliefs effecting healthcare   Denies abuse and feels safe at home.    Social Determinants of Health   Financial Resource Strain: Not on file  Food Insecurity: Not on file  Transportation Needs: Not on file  Physical Activity: Not on file  Stress: Not on file  Social Connections: Not on file   Family History  Problem Relation Age of Onset  . Hypertension Mother   . Diabetes Mother   . Heart disease Sister   . Hypertension Sister   . Hypertension Brother   . Cancer Maternal Grandmother        female cancer ? type  . Colon cancer Maternal Grandfather   . Heart disease Maternal Aunt   . Heart attack Brother        x 3  . Prostate cancer Maternal Uncle   . Lung cancer Paternal Uncle   . Clotting disorder Brother    Allergies  Allergen Reactions  . Codeine Nausea Only  . Asa [Aspirin] Hives   Prior to Admission medications   Medication Sig Start Date End Date Taking? Authorizing Provider  albuterol (PROVENTIL HFA;VENTOLIN HFA) 108 (90 Base) MCG/ACT inhaler Inhale 2 puffs every 4 (four)  hours as needed into the lungs for wheezing or shortness of breath (cough, shortness of breath or wheezing.). 01/15/17   Robyn Haber, MD  Coenzyme Q10 (COQ-10) 50 MG CAPS Take 50 mg by mouth daily.    [provider]  ibuprofen (ADVIL,MOTRIN) 600 MG tablet Take 1 tablet (600 mg total) by mouth every 6 (six) hours as needed for mild pain, moderate pain or cramping. 03/20/17   Everett Graff, MD  Omega-3 Fatty Acids (FISH OIL) 1200 MG CAPS Take 1,200 mg by mouth daily.    [provider]  ranitidine (ZANTAC) 150 MG tablet Take 1 tablet (150 mg total) by mouth 2 (two) times daily as needed for heartburn. 03/12/17   Binnie Rail, MD     Positive ROS: Otherwise negative  All other systems have been reviewed and were otherwise negative with the exception of those mentioned in the HPI and as above.  Physical Exam: Constitutional: Alert, well-appearing, no acute distress Ears: External ears without lesions or tenderness.  On microscopic exam ear canals and TMs are clear bilaterally.  TMs with good mobility on pneumatic otoscopy.  No middle ear space abnormality noted. Nasal: External nose without lesions. Septum slightly deviated to the right with mild rhinitis.. Clear nasal passages otherwise. Oral: Lips and gums without lesions. Tongue and palate mucosa without lesions. Posterior oropharynx clear.  Tonsils are 2+ and symmetric bilaterally with no exudate.  Indirect laryngoscopy revealed a clear base of tongue vallecula and epiglottis.  Hypopharynx was clear.  Piriform sinuses were clear.  Vocal cords are clear. Neck: No palpable adenopathy or masses Respiratory: Breathing comfortably  Skin: No facial/neck lesions or rash noted.  Procedures  Assessment: I suspect the sore throat probably more tonsil related although she has no evidence of infection presently.  She has clear upper airway examination otherwise. Normal ear evaluation bilaterally.  Plan: Suspect the bleeding  from her right ear most likely was related to Q-tip use but she has normal exam today. Concerning the sore throat complaints she does have tonsils which are generous size but no acute exudate or signs of infection presently.  Clear upper airway exam otherwise.  Radene Journey, MD

## 2020-12-15 ENCOUNTER — Ambulatory Visit: Payer: BC Managed Care – PPO | Attending: Family

## 2020-12-15 DIAGNOSIS — Z23 Encounter for immunization: Secondary | ICD-10-CM

## 2020-12-15 NOTE — Progress Notes (Signed)
   Covid-19 Vaccination Clinic  Name:  Kelly Abbott    MRN: [id number] DOB: October 18, 1955  12/15/2020  Ms. Kelly Abbott was observed post Covid-19 immunization for 15 minutes without incident. She was provided with Vaccine Information Sheet and instruction to access the V-Safe system.   Ms. Kelly Abbott was instructed to call 911 with any severe reactions post vaccine: Difficulty breathing  Swelling of face and throat  A fast heartbeat  A bad rash all over body  Dizziness and weakness

## 2021-03-28 ENCOUNTER — Encounter (HOSPITAL_COMMUNITY): Payer: Self-pay

## 2021-03-28 ENCOUNTER — Other Ambulatory Visit: Payer: Self-pay

## 2021-03-28 ENCOUNTER — Ambulatory Visit (HOSPITAL_COMMUNITY)
Admission: EM | Admit: 2021-03-28 | Discharge: 2021-03-28 | Disposition: A | Discharge status: Home / Self Care | Payer: BC Managed Care – PPO | Attending: Family Medicine | Admitting: Family Medicine

## 2021-03-28 DIAGNOSIS — M62830 Muscle spasm of back: Secondary | ICD-10-CM | POA: Diagnosis not present

## 2021-03-28 DIAGNOSIS — S39012A Strain of muscle, fascia and tendon of lower back, initial encounter: Secondary | ICD-10-CM

## 2021-03-28 MED ORDER — KETOROLAC TROMETHAMINE 30 MG/ML IJ SOLN
INTRAMUSCULAR | Status: AC
  Filled 2021-03-28: qty 1

## 2021-03-28 MED ORDER — TIZANIDINE HCL 4 MG PO TABS: 4.0000 mg | ORAL_TABLET | Freq: Three times a day (TID) | ORAL | 0 refills | Status: DC | PRN

## 2021-03-28 MED ORDER — IBUPROFEN 800 MG PO TABS: 800.0000 mg | ORAL_TABLET | Freq: Three times a day (TID) | ORAL | 0 refills | Status: DC

## 2021-03-28 MED ORDER — KETOROLAC TROMETHAMINE 30 MG/ML IJ SOLN
30.0000 mg | Freq: Once | INTRAMUSCULAR | Status: AC
  Administered 2021-03-28: 16:00:00 30 mg via INTRAMUSCULAR

## 2021-03-28 NOTE — Discharge Instructions (Signed)

## 2021-03-28 NOTE — ED Provider Notes (Signed)
[place]   [id number] 03/28/21 Arrival Time: 8466  ASSESSMENT & PLAN:  1. Strain of lumbar region, initial encounter   2. Muscle spasm of back    Able to ambulate here and hemodynamically stable. No indication for imaging of back at this time given no trauma and normal neurological exam. Discussed.  Meds ordered this encounter  Medications   ketorolac (TORADOL) 30 MG/ML injection 30 mg   tiZANidine (ZANAFLEX) 4 MG tablet    Sig: Take 1 tablet (4 mg total) by mouth every 8 (eight) hours as needed for muscle spasms.    Dispense:  20 tablet    Refill:  0   Encourage ROM/movement as tolerated. Work note provided.    Discharge Instructions      HOME CARE INSTRUCTIONS: For many people, back pain returns. Since low back pain is rarely dangerous, it is often a condition that people can learn to manage on their own. Please remain active. It is stressful on the back to sit or stand in one place. Do not sit, drive, or stand in one place for more than 30 minutes at a time. Take short walks on level surfaces as soon as pain allows. Try to increase the length of time you walk each day. Do not stay in bed. Resting more than 1 or 2 days can delay your recovery. Do not avoid exercise or work. Your body is made to move. It is not dangerous to be active, even though your back may hurt. Your back will likely heal faster if you return to being active before your pain is gone. Over-the-counter medicines to reduce pain and inflammation are often the most helpful.  SEEK MEDICAL CARE IF: You have pain that is not relieved with rest or medicine. You have pain that does not improve in 1 week. You have new symptoms. You are generally not feeling well.  SEEK IMMEDIATE MEDICAL CARE IF: You have pain that radiates from your back into your legs. You develop new bowel or bladder control problems. You have unusual weakness or numbness in your arms or legs. You develop nausea or vomiting. You  develop abdominal pain. You feel faint.      Recommend:  Follow-up Information     Leeroy Cha, MD.   Specialty: Internal Medicine Why: If worsening or failing to improve as anticipated. Contact information: [address] [place] [address] [phone]         [place].   Specialty: Emergency Medicine Why: If symptoms worsen in any way. Contact information: [address] [id number] [place] [phone]                Reviewed expectations re: course of current medical issues. Questions answered. Outlined signs and symptoms indicating need for more acute intervention. Patient verbalized understanding. After Visit Summary given.   SUBJECTIVE: History from: patient.  Tyreona Panjwani is a 66 y.o. female who presents with complaint of fairly persistent R lower back pain; noted yesterday while sitting. "Feels like something pulled". No trauma. No radiation. No extremity sensation changes or weakness. Normal bowel/bladder habits. One ibuprofen tab without relief.  OBJECTIVE:  Vitals:   03/28/21 1535  BP: 138/81  Pulse: 76  Resp: 18  Temp: 98.7 F (37.1 C)  TempSrc: Oral  SpO2: 96%    General appearance: alert; no distress HEENT: ; AT Neck: supple with FROM; without midline tenderness Lungs: unlabored respirations; speaks full sentences without difficulty  Abdomen: soft, non-tender; non-distended Back: moderate and poorly localized tenderness to palpation over R lumbar paraspinal musculature ; FROM at waist; bruising: none; without midline tenderness Extremities: without edema; symmetrical without gross deformities; normal ROM of bilateral LE Skin: warm and dry; no rashes Neurologic: normal gait; normal sensation and strength of bilateral LE Psychological: alert and cooperative; normal mood and affect  Labs Reviewed: Results for orders  placed or performed during the hospital encounter of 06/12/20  Protime-INR  Result Value Ref Range   Prothrombin Time 13.2 11.4 - 15.2 seconds   INR 1.0 0.8 - 1.2  APTT  Result Value Ref Range   aPTT 30 24 - 36 seconds  CBC  Result Value Ref Range   WBC 10.9 (H) 4.0 - 10.5 K/uL   RBC 4.87 3.87 - 5.11 MIL/uL   Hemoglobin 13.4 12.0 - 15.0 g/dL   HCT 42.2 36.0 - 46.0 %   MCV 86.7 80.0 - 100.0 fL   MCH 27.5 26.0 - 34.0 pg   MCHC 31.8 30.0 - 36.0 g/dL   RDW 14.9 11.5 - 15.5 %   Platelets 424 (H) 150 - 400 K/uL   nRBC 0.0 0.0 - 0.2 %  Differential  Result Value Ref Range   Neutrophils Relative % 69 %   Neutro Abs 7.5 1.7 - 7.7 K/uL   Lymphocytes Relative 25 %   Lymphs Abs 2.7 0.7 - 4.0 K/uL   Monocytes Relative 5 %   Monocytes Absolute 0.5 0.1 - 1.0 K/uL   Eosinophils Relative 1 %   Eosinophils Absolute 0.1 0.0 - 0.5 K/uL   Basophils Relative 0 %   Basophils Absolute 0.0 0.0 - 0.1 K/uL   Immature Granulocytes 0 %   Abs Immature Granulocytes 0.04 0.00 - 0.07 K/uL  Comprehensive metabolic panel  Result Value Ref Range   Sodium 137 135 - 145 mmol/L   Potassium 3.8 3.5 - 5.1 mmol/L   Chloride 104 98 - 111 mmol/L   CO2 28 22 - 32 mmol/L   Glucose, Bld 97 70 - 99 mg/dL   BUN 7 (L) 8 - 23 mg/dL   Creatinine, Ser 0.74 0.44 - 1.00 mg/dL   Calcium 9.2 8.9 - 10.3 mg/dL   Total Protein 7.1 6.5 - 8.1 g/dL   Albumin 3.6 3.5 - 5.0 g/dL   AST 20 15 - 41 U/L   ALT 29 0 - 44 U/L   Alkaline Phosphatase 67 38 - 126 U/L   Total Bilirubin 0.6 0.3 - 1.2 mg/dL   GFR, Estimated >60 >60 mL/min   Anion gap 5 5 - 15   Labs Reviewed - No data to display  Imaging: No results found.  Allergies  Allergen Reactions   Codeine Nausea Only   Asa [Aspirin] Hives    Past Medical History:  Diagnosis Date   Allergy    Anxiety    Bronchitis 10/2016   Gastric ulcer    GERD (gastroesophageal reflux disease)    H/O measles    H/O mumps    Headache    history of migraines   History of chicken  pox    Migraines    Panic attacks 2005   Shingles    Vitamin D deficiency    Wears partial dentures    upper partial   Social History   Socioeconomic History   Marital status: Married    Spouse name: Not on file   Number of children: 1   Years of education: 16   Highest  education level: Not on file  Occupational History   Occupation: Agricultural engineer    Comment: [employer]  Tobacco Use   Smoking status: Never   Smokeless tobacco: Never  Vaping Use   Vaping Use: Never used  Substance and Sexual Activity   Alcohol use: No   Drug use: No   Sexual activity: Yes    Birth control/protection: None  Other Topics Concern   Not on file  Social History Narrative   Fun: Bowling, shopping, workout   Denies religious beliefs effecting healthcare   Denies abuse and feels safe at home.    Social Determinants of Health   Financial Resource Strain: Not on file  Food Insecurity: Not on file  Transportation Needs: Not on file  Physical Activity: Not on file  Stress: Not on file  Social Connections: Not on file  Intimate Partner Violence: Not on file   Family History  Problem Relation Age of Onset   Hypertension Mother    Diabetes Mother    Heart disease Sister    Hypertension Sister    Hypertension Brother    Cancer Maternal Grandmother        female cancer ? type   Colon cancer Maternal Grandfather    Heart disease Maternal Aunt    Heart attack Brother        x 3   Prostate cancer Maternal Uncle    Lung cancer Paternal Uncle    Clotting disorder Brother    Past Surgical History:  Procedure Laterality Date   APPENDECTOMY     BUNIONECTOMY Right 05/01/12   Liane Comber, right foot   BUNIONECTOMY Left    CHOLECYSTECTOMY N/A 03/18/2013   Procedure: LAPAROSCOPIC CHOLECYSTECTOMY;  Surgeon: Harl Bowie, MD;  Location: [facility];  Service: General;  Laterality: N/A;   COLONOSCOPY     CYSTOSCOPY N/A 03/19/2017   Procedure: Consuela Mimes;   Surgeon: Everett Graff, MD;  Location: [facility];  Service: Gynecology;  Laterality: N/A;   DILATATION & CURRETTAGE/HYSTEROSCOPY WITH RESECTOCOPE N/A 09/07/2014   Procedure: [place];  Surgeon: Everett Graff, MD;  Location: [facility];  Service: Gynecology;  Laterality: N/A;   ENDOMETRIAL BIOPSY  Rotator   IUD REMOVAL     LAPAROSCOPIC VAGINAL HYSTERECTOMY WITH SALPINGO OOPHORECTOMY Bilateral 03/19/2017   Procedure: LAPAROSCOPIC ASSISTED VAGINAL HYSTERECTOMY WITH SALPINGO OOPHORECTOMY;  Surgeon: Everett Graff, MD;  Location: [facility];  Service: Gynecology;  Laterality: Bilateral;  2.5 hours   Remove Implant Deep Right 05/01/12/   Right foot   ROTATOR CUFF REPAIR  02/09/2015   right   TUBAL LIGATION     UPPER GI ENDOSCOPY        Vanessa Kick, MD 03/28/21 [id number]

## 2021-03-28 NOTE — ED Triage Notes (Signed)
Pt presents with lower back pain x2 days.   States it moves to the R side.   Pt states she can hardly turn.

## 2021-05-24 ENCOUNTER — Institutional Professional Consult (permissible substitution): Payer: BC Managed Care – PPO | Admitting: Pulmonary Disease

## 2021-08-21 NOTE — Progress Notes (Unsigned)
NEW PATIENT Date of Service/Encounter:  08/23/21 Referring provider: Leeroy Cha,* Primary care provider: Leeroy Cha, MD  Subjective:  Kelly Abbott is a 66 y.o. female with a PMHx of GERD, moderate asthma, obesity, insomnia presenting today for evaluation of asthma. History obtained from: chart review and patient.   History -concern for possible asthma:  -Never been formally diagnosed with asthma.  At age 73 she did have bronchitis, and she was really sick and had an albuterol inhaler during that time which helped a lot.   Every now and then she will use an albuterol inhaler at night to help with wheezing. She thinks she uses inhaler for nighttime wheezing about 2-3 times per month.   Associates shortness of breath.  She thought this was due to weight gain.  She also coughs- cough is dry.  Worse at night.  Wakes her up at least twice per month. Occasionally coughs during the day - around 4 times per month.  When that happens, she uses her albuterol and drinks water and this helps.  She has had one steroid course during recent hospitalization this year.  She does get shortness of breath, cough and wheeze when she is doing laundry and is up and down lifting clothes. When she does exercise will also have these symptoms.  - 1 number of lifetime hospitalizations, 1 number of lifetime intubations.  - Identified Triggers: exercise and respiratory illness - Up-to-date with pneumonia,, Covid-19,, and Flu, vaccines. - History of prior pneumonias: once, over 10 years ago - History of prior COVID-19 infection: once - 2022 - Smoking exposure: no, denies previous smoking history Previous Diagnostics:  - Most Recent AEC (04/29/21): 100; (2019)-200 -Most Recent Chest Imaging: CXR on (2019): normal read - Today's Asthma Control Test:  .   Management:  - Rescue: Albuterol 2 puffs q4-6 hrs PRN, using prior to exercise which helps  Hospitalized on 04/29/21: "Reactive  airway disease with exacerbation likely undiagnosed asthma Rhino/enterovirus, without hypoxia, POA, active"   Presented with diffuse expiratory wheezing. Seen per PCP with concerns for asthma uncontrolled with Abx and steroids outpatient -S/P cont albuterol nebs for 4.5 hours in ED, IV mag, solu-medrol and duo-neb x Pro-calcitonin negative. -Scheduled duo-nebs Q4H while inpatient" Sent home on prednisone taper.  Other allergy screening: Rhino conjunctivitis: no Food allergy: no Medication allergy:  yes-aspirin caused vomiting and she has taken baby aspirin recently and did not have symptoms (around 6 months ago), and codeine causes her vomiting and hives Hymenoptera allergy: no Urticaria: no Eczema:no History of recurrent infections suggestive of immunodeficency: no Vaccinations are up to date.   Past Medical History: Past Medical History:  Diagnosis Date   Allergy    Anxiety    Bronchitis 10/2016   Gastric ulcer    GERD (gastroesophageal reflux disease)    H/O measles    H/O mumps    Headache    history of migraines   History of chicken pox    Migraines    Panic attacks 2005   Shingles    Vitamin D deficiency    Wears partial dentures    upper partial   Medication List:  Current Outpatient Medications  Medication Sig Dispense Refill   budesonide-formoterol (SYMBICORT) 80-4.5 MCG/ACT inhaler Start at onset of respiratory illness/asthma flare and use 2 puffs twice daily with spacer for 2 weeks or until symptoms resolve. Wash mouth out after use. 1 each 12   Coenzyme Q10 (COQ-10) 50 MG CAPS Take 50 mg by mouth daily.  ibuprofen (ADVIL) 800 MG tablet Take 1 tablet (800 mg total) by mouth 3 (three) times daily with meals. 21 tablet 0   IBUPROFEN PO      Multiple Vitamins-Minerals (ZINC PO) Take by mouth once daily as a supplement     Omega-3 Fatty Acids (FISH OIL) 1200 MG CAPS Take 1,200 mg by mouth daily.     albuterol (PROVENTIL HFA;VENTOLIN HFA) 108 (90 Base) MCG/ACT  inhaler Inhale 2 puffs every 4 (four) hours as needed into the lungs for wheezing or shortness of breath (cough, shortness of breath or wheezing.). (Patient not taking: Reported on 08/23/2021) 1 Inhaler 1   No current facility-administered medications for this visit.   Known Allergies:  Allergies  Allergen Reactions   Codeine Nausea Only   Past Surgical History: Past Surgical History:  Procedure Laterality Date   APPENDECTOMY     BUNIONECTOMY Right 05/01/12   Liane Comber, right foot   BUNIONECTOMY Left    CHOLECYSTECTOMY N/A 03/18/2013   Procedure: LAPAROSCOPIC CHOLECYSTECTOMY;  Surgeon: Harl Bowie, MD;  Location: [facility];  Service: General;  Laterality: N/A;   COLONOSCOPY     CYSTOSCOPY N/A 03/19/2017   Procedure: CYSTOSCOPY;  Surgeon: Everett Graff, MD;  Location: [facility];  Service: Gynecology;  Laterality: N/A;   DILATATION & CURRETTAGE/HYSTEROSCOPY WITH RESECTOCOPE N/A 09/07/2014   Procedure: [place];  Surgeon: Everett Graff, MD;  Location: [facility];  Service: Gynecology;  Laterality: N/A;   ENDOMETRIAL BIOPSY  Rotator   IUD REMOVAL     LAPAROSCOPIC VAGINAL HYSTERECTOMY WITH SALPINGO OOPHORECTOMY Bilateral 03/19/2017   Procedure: LAPAROSCOPIC ASSISTED VAGINAL HYSTERECTOMY WITH SALPINGO OOPHORECTOMY;  Surgeon: Everett Graff, MD;  Location: [facility];  Service: Gynecology;  Laterality: Bilateral;  2.5 hours   Remove Implant Deep Right 05/01/12/   Right foot   ROTATOR CUFF REPAIR  02/09/2015   right   TUBAL LIGATION     UPPER GI ENDOSCOPY     Family History: Family History  Problem Relation Age of Onset   Hypertension Mother    Diabetes Mother    Heart disease Sister    Hypertension Sister    Hypertension Brother    Cancer Maternal Grandmother        female cancer ? type   Colon cancer Maternal Grandfather    Heart disease Maternal Aunt    Heart attack Brother        x 3   Prostate cancer Maternal Uncle    Lung  cancer Paternal Uncle    Clotting disorder Brother    Social History: Patrici lives in a house built 31 years ago, no water damage, laminate floors, electric heating, central AC, no pets, using dust mite protection on the bedding and pillows, no smoke exposure.  She is a Environmental health practitioner for 23 years, [place].  She is not exposed to fumes chemicals or dust..   ROS:  All other systems negative except as noted per HPI.  Objective:  Blood pressure 124/78, pulse 69, temperature 97.9 F (36.6 C), resp. rate 18, height '5\' 4"'$  (1.626 m), weight 218 lb 8 oz (99.1 kg), last menstrual period 08/30/2014, SpO2 99 %. Body mass index is 37.51 kg/m. Physical Exam:  General Appearance:  Alert, cooperative, no distress, appears stated age  Head:  Normocephalic, without obvious abnormality, atraumatic  Eyes:  Conjunctiva clear, EOM's intact  Nose: Nares normal, hypertrophic turbinates, normal mucosa, and no visible anterior polyps  Throat: Lips, tongue normal;  teeth and gums normal, normal posterior oropharynx  Neck: Supple, symmetrical  Lungs:   clear to auscultation bilaterally, Respirations unlabored, no coughing  Heart:  regular rate and rhythm and no murmur, Appears well perfused  Extremities: No edema  Skin: Skin color, texture, turgor normal, no rashes or lesions on visualized portions of skin  Neurologic: No gross deficits   Diagnostics: Spirometry:  Tracings reviewed. Her effort: Good reproducible efforts. FVC: 1.68L (pre), 1.69L  (post) FEV1: 1.42L, 72% predicted (pre), 1.47L, 75% predicted (post) FEV1/FVC ratio: 109% (pre), 112% (post) Interpretation: Spirometry consistent with possible restrictive disease with out bronchodilator response  Assessment and Plan  [name] history is concerning for mild persistent asthma with triggers including exercise and infections.  Discussed starting a daily preventative medicine, but she does not feel that her  symptoms warrant a daily therapy.  Discussed block therapy using Symbicort 80 mcg, 2 puffs twice a day during illness to try and prevent hospitalizations as well as need for systemic steroids.  She preferred that approach at this time. She was not interested in allergy testing as denies every having allergies and denies rhinitis symptoms.   Also we discussed her drug allergies.  She has had vomiting with aspirin in the past, but did take a baby aspirin 6 months ago and had absolutely no symptoms.  This was removed from her allergy list.  Mild Persistent Asthma: - your lung testing today looked good, and did not improve significantly with albuterol but this does not rule out asthma-your history is consistent with asthma - Controller Inhaler: Start Symbicort 80 mcg 2 puffs twice a day; Use In Block Therapy-Start if having respiratory symptoms.  Use for at least 2 week or until symptoms resolve. - Rinse mouth out after use - Rescue Inhaler: Albuterol (Proair/Ventolin) 2 puffs . Use  every 4-6 hours as needed for chest tightness, wheezing, or coughing.  Can also use 15 minutes prior to exercise if you have symptoms with activity. - Asthma is not controlled if:  - Symptoms are occurring >2 times a week OR  - >2 times a month nighttime awakenings  - You are requiring systemic steroids (prednisone/steroid injections) more than once per year  - Your require hospitalization for your asthma. Please let us know about any ED visits, hospitalizations or injectable or oral steroids given for your asthma.   - Please call the clinic to schedule a follow up if these symptoms arise  Follow-up in 3 months, if doing well at that visit, can space to yearly or sooner if needed It was so nice meeting you today!  Sigurd Sos, MD [facility]  This note in its entirety was forwarded to the Provider who requested this consultation.  Thank you for your kind referral. I appreciate the opportunity to  take part in Des[place]anna's care. Please do not hesitate to contact me with questions.  Sincerely,  Sigurd Sos, MD [facility]

## 2021-08-23 ENCOUNTER — Encounter: Payer: Self-pay | Admitting: Internal Medicine

## 2021-08-23 ENCOUNTER — Ambulatory Visit (INDEPENDENT_AMBULATORY_CARE_PROVIDER_SITE_OTHER): Payer: BC Managed Care – PPO | Admitting: Internal Medicine

## 2021-08-23 VITALS — BP 124/78 | HR 69 | Temp 97.9°F | Resp 18 | Ht 64.0 in | Wt 218.5 lb

## 2021-08-23 DIAGNOSIS — J4530 Mild persistent asthma, uncomplicated: Secondary | ICD-10-CM | POA: Diagnosis not present

## 2021-08-23 DIAGNOSIS — Z889 Allergy status to unspecified drugs, medicaments and biological substances status: Secondary | ICD-10-CM

## 2021-08-23 MED ORDER — ALBUTEROL SULFATE HFA 108 (90 BASE) MCG/ACT IN AERS: 2.0000 | INHALATION_SPRAY | RESPIRATORY_TRACT | 1 refills | Status: DC | PRN

## 2021-08-23 MED ORDER — BUDESONIDE-FORMOTEROL FUMARATE 80-4.5 MCG/ACT IN AERO: INHALATION_SPRAY | RESPIRATORY_TRACT | 12 refills | Status: DC

## 2021-08-23 NOTE — Patient Instructions (Addendum)
Mild Persistent Asthma: - your lung testing today looked good, and did not improve significantly with albuterol but this does not rule out asthma-your history is consistent with asthma - Controller Inhaler: Start Symbicort 80 mcg 2 puffs twice a day; Use In Block Therapy-Start if having respiratory symptoms.  Use for at least 2 week or until symptoms resolve. - Rinse mouth out after use - Rescue Inhaler: Albuterol (Proair/Ventolin) 2 puffs . Use  every 4-6 hours as needed for chest tightness, wheezing, or coughing.  Can also use 15 minutes prior to exercise if you have symptoms with activity. - Asthma is not controlled if:  - Symptoms are occurring >2 times a week OR  - >2 times a month nighttime awakenings  - You are requiring systemic steroids (prednisone/steroid injections) more than once per year  - Your require hospitalization for your asthma. Please let us know about any ED visits, hospitalizations or injectable or oral steroids given for your asthma.   - Please call the clinic to schedule a follow up if these symptoms arise  Follow-up in 3 months, if doing well at that visit, can space to yearly or sooner if needed It was so nice meeting you today!  Sigurd Sos, MD [facility]

## 2022-04-23 ENCOUNTER — Observation Stay (HOSPITAL_BASED_OUTPATIENT_CLINIC_OR_DEPARTMENT_OTHER)
Admission: EM | Admit: 2022-04-23 | Discharge: 2022-04-25 | Disposition: A | Discharge status: Home / Self Care | Payer: Medicare PPO | Attending: Internal Medicine | Admitting: Internal Medicine

## 2022-04-23 ENCOUNTER — Encounter (HOSPITAL_BASED_OUTPATIENT_CLINIC_OR_DEPARTMENT_OTHER): Payer: Self-pay | Admitting: Emergency Medicine

## 2022-04-23 ENCOUNTER — Other Ambulatory Visit: Payer: Self-pay

## 2022-04-23 ENCOUNTER — Emergency Department (HOSPITAL_BASED_OUTPATIENT_CLINIC_OR_DEPARTMENT_OTHER): Payer: Medicare PPO | Admitting: Radiology

## 2022-04-23 DIAGNOSIS — F410 Panic disorder [episodic paroxysmal anxiety] without agoraphobia: Secondary | ICD-10-CM | POA: Insufficient documentation

## 2022-04-23 DIAGNOSIS — F411 Generalized anxiety disorder: Secondary | ICD-10-CM

## 2022-04-23 DIAGNOSIS — I4892 Unspecified atrial flutter: Secondary | ICD-10-CM

## 2022-04-23 DIAGNOSIS — J4551 Severe persistent asthma with (acute) exacerbation: Principal | ICD-10-CM | POA: Diagnosis present

## 2022-04-23 DIAGNOSIS — Z1152 Encounter for screening for COVID-19: Secondary | ICD-10-CM | POA: Diagnosis not present

## 2022-04-23 DIAGNOSIS — K219 Gastro-esophageal reflux disease without esophagitis: Secondary | ICD-10-CM | POA: Insufficient documentation

## 2022-04-23 DIAGNOSIS — Z79899 Other long term (current) drug therapy: Secondary | ICD-10-CM | POA: Insufficient documentation

## 2022-04-23 DIAGNOSIS — R0602 Shortness of breath: Secondary | ICD-10-CM | POA: Diagnosis present

## 2022-04-23 LAB — BASIC METABOLIC PANEL
Anion gap: 11 (ref 5–15)
BUN: 7 mg/dL — ABNORMAL LOW (ref 8–23)
CO2: 26 mmol/L (ref 22–32)
Calcium: 9.8 mg/dL (ref 8.9–10.3)
Chloride: 101 mmol/L (ref 98–111)
Creatinine, Ser: 0.66 mg/dL (ref 0.44–1.00)
GFR, Estimated: >60 mL/min (ref >60)
Glucose, Bld: 87 mg/dL (ref 70–99)
Potassium: 3.9 mmol/L (ref 3.5–5.1)
Sodium: 138 mmol/L (ref 135–145)

## 2022-04-23 LAB — RESP PANEL BY RT-PCR (RSV, FLU A&B, COVID)  RVPGX2
Influenza A by PCR: NEGATIVE
Influenza B by PCR: NEGATIVE
Resp Syncytial Virus by PCR: NEGATIVE
SARS Coronavirus 2 by RT PCR: NEGATIVE

## 2022-04-23 LAB — CBC WITH DIFFERENTIAL/PLATELET
Abs Immature Granulocytes: 0.05 10*3/uL (ref 0.00–0.07)
Basophils Absolute: 0.1 10*3/uL (ref 0.0–0.1)
Basophils Relative: 0 %
Eosinophils Absolute: 0.6 10*3/uL — ABNORMAL HIGH (ref 0.0–0.5)
Eosinophils Relative: 5 %
HCT: 41.6 % (ref 36.0–46.0)
Hemoglobin: 13.5 g/dL (ref 12.0–15.0)
Immature Granulocytes: 0 %
Lymphocytes Relative: 25 %
Lymphs Abs: 3.4 10*3/uL (ref 0.7–4.0)
MCH: 27.1 pg (ref 26.0–34.0)
MCHC: 32.5 g/dL (ref 30.0–36.0)
MCV: 83.4 fL (ref 80.0–100.0)
Monocytes Absolute: 0.8 10*3/uL (ref 0.1–1.0)
Monocytes Relative: 6 %
Neutro Abs: 8.7 10*3/uL — ABNORMAL HIGH (ref 1.7–7.7)
Neutrophils Relative %: 64 %
Platelets: 419 10*3/uL — ABNORMAL HIGH (ref 150–400)
RBC: 4.99 MIL/uL (ref 3.87–5.11)
RDW: 15.7 % — ABNORMAL HIGH (ref 11.5–15.5)
WBC: 13.6 10*3/uL — ABNORMAL HIGH (ref 4.0–10.5)
nRBC: 0 % (ref 0.0–0.2)

## 2022-04-23 LAB — BRAIN NATRIURETIC PEPTIDE: B Natriuretic Peptide: 15.6 pg/mL (ref 0.0–100.0)

## 2022-04-23 MED ORDER — IPRATROPIUM-ALBUTEROL 0.5-2.5 (3) MG/3ML IN SOLN
3.0000 mL | Freq: Four times a day (QID) | RESPIRATORY_TRACT | Status: DC
  Administered 2022-04-23 – 2022-04-24 (×3): 3 mL via RESPIRATORY_TRACT
  Filled 2022-04-23 (×3): qty 3

## 2022-04-23 MED ORDER — HYDROCOD POLI-CHLORPHE POLI ER 10-8 MG/5ML PO SUER
5.0000 mL | Freq: Two times a day (BID) | ORAL | Status: DC | PRN
  Administered 2022-04-23 – 2022-04-25 (×3): 5 mL via ORAL
  Filled 2022-04-23 (×3): qty 5

## 2022-04-23 MED ORDER — ACETAMINOPHEN 325 MG PO TABS
650.0000 mg | ORAL_TABLET | Freq: Four times a day (QID) | ORAL | Status: DC | PRN
  Administered 2022-04-23: 650 mg via ORAL
  Filled 2022-04-23: qty 2

## 2022-04-23 MED ORDER — ONDANSETRON HCL 4 MG PO TABS: 4.0000 mg | ORAL_TABLET | Freq: Four times a day (QID) | ORAL | Status: DC | PRN

## 2022-04-23 MED ORDER — ALBUTEROL SULFATE (2.5 MG/3ML) 0.083% IN NEBU
10.0000 mg | INHALATION_SOLUTION | Freq: Once | RESPIRATORY_TRACT | Status: AC
  Administered 2022-04-23: 10 mg via RESPIRATORY_TRACT

## 2022-04-23 MED ORDER — ONDANSETRON HCL 4 MG/2ML IJ SOLN: 4.0000 mg | Freq: Four times a day (QID) | INTRAMUSCULAR | Status: DC | PRN

## 2022-04-23 MED ORDER — ENOXAPARIN SODIUM 40 MG/0.4ML IJ SOSY
40.0000 mg | PREFILLED_SYRINGE | INTRAMUSCULAR | Status: DC
  Administered 2022-04-23 – 2022-04-24 (×2): 40 mg via SUBCUTANEOUS
  Filled 2022-04-23 (×2): qty 0.4

## 2022-04-23 MED ORDER — COQ-10 50 MG PO CAPS: 50.0000 mg | ORAL_CAPSULE | Freq: Every day | ORAL | Status: DC

## 2022-04-23 MED ORDER — ALBUTEROL SULFATE (2.5 MG/3ML) 0.083% IN NEBU
10.0000 mg | INHALATION_SOLUTION | Freq: Once | RESPIRATORY_TRACT | Status: AC
  Administered 2022-04-23: 10 mg via RESPIRATORY_TRACT
  Filled 2022-04-23: qty 12

## 2022-04-23 MED ORDER — SORBITOL 70 % SOLN: 30.0000 mL | Freq: Every day | Status: DC | PRN

## 2022-04-23 MED ORDER — TRAZODONE HCL 50 MG PO TABS: 25.0000 mg | ORAL_TABLET | Freq: Every evening | ORAL | Status: DC | PRN

## 2022-04-23 MED ORDER — ALPRAZOLAM 0.25 MG PO TABS: 0.2500 mg | ORAL_TABLET | Freq: Three times a day (TID) | ORAL | Status: DC | PRN

## 2022-04-23 MED ORDER — DILTIAZEM HCL ER COATED BEADS 120 MG PO CP24: 120.0000 mg | ORAL_CAPSULE | Freq: Every day | ORAL | Status: DC

## 2022-04-23 MED ORDER — GUAIFENESIN ER 600 MG PO TB12
600.0000 mg | ORAL_TABLET | Freq: Two times a day (BID) | ORAL | Status: DC
  Filled 2022-04-23 (×4): qty 1

## 2022-04-23 MED ORDER — METHYLPREDNISOLONE SODIUM SUCC 40 MG IJ SOLR
40.0000 mg | Freq: Two times a day (BID) | INTRAMUSCULAR | Status: AC
  Administered 2022-04-23 – 2022-04-24 (×2): 40 mg via INTRAVENOUS
  Filled 2022-04-23 (×2): qty 1

## 2022-04-23 MED ORDER — SODIUM CHLORIDE 0.9 % IV SOLN: INTRAVENOUS | Status: DC

## 2022-04-23 MED ORDER — OMEGA-3-ACID ETHYL ESTERS 1 G PO CAPS
1.0000 g | ORAL_CAPSULE | Freq: Every day | ORAL | Status: DC
  Administered 2022-04-24 – 2022-04-25 (×2): 1 g via ORAL
  Filled 2022-04-23 (×2): qty 1

## 2022-04-23 MED ORDER — ACETAMINOPHEN 650 MG RE SUPP: 650.0000 mg | Freq: Four times a day (QID) | RECTAL | Status: DC | PRN

## 2022-04-23 MED ORDER — IPRATROPIUM BROMIDE 0.02 % IN SOLN
0.5000 mg | Freq: Once | RESPIRATORY_TRACT | Status: AC
  Administered 2022-04-23: 0.5 mg via RESPIRATORY_TRACT

## 2022-04-23 MED ORDER — PREDNISONE 20 MG PO TABS
40.0000 mg | ORAL_TABLET | Freq: Every day | ORAL | Status: DC
  Administered 2022-04-25: 40 mg via ORAL
  Filled 2022-04-23: qty 2

## 2022-04-23 MED ORDER — MAGNESIUM SULFATE 2 GM/50ML IV SOLN
2.0000 g | Freq: Once | INTRAVENOUS | Status: AC
  Administered 2022-04-23: 2 g via INTRAVENOUS
  Filled 2022-04-23: qty 50

## 2022-04-23 MED ORDER — ALBUTEROL SULFATE (2.5 MG/3ML) 0.083% IN NEBU
INHALATION_SOLUTION | RESPIRATORY_TRACT | Status: AC
  Filled 2022-04-23: qty 12

## 2022-04-23 NOTE — H&P (Signed)
[place]   PATIENT NAME: Kelly Abbott    MR#:  [id number]  DATE OF BIRTH:  11/26/1955  DATE OF ADMISSION:  04/23/2022  PRIMARY CARE PHYSICIAN: Leeroy Cha, MD   Patient is coming from: Home     REQUESTING/REFERRING PHYSICIAN: Sherrill Raring, PA-C    CHIEF COMPLAINT:   Chief Complaint  Patient presents with   Shortness of Breath    HISTORY OF PRESENT ILLNESS:  Kelly Abbott is a 67 y.o. female with medical history significant for asthma, GERD, migraine, PUD, anxiety and panic attacks, presented to the emergency room with acute onset of worsening dyspnea over the last week with associated cough and wheezing which have been significantly worsening over the last couple of nights.  The patient has been using her bronchodilator nebulizer therapy every 4 hours with minimal improvement.  She has been expectorating whitish sputum.  No chest pain or palpitations.  She has been having orthopnea over the last couple of days without paroxysmal nocturnal dyspnea or lower extremity edema.  She denied any fever or chills.  No hemoptysis.  She denied any nausea or vomiting but has been having abdominal discomfort with cough.  No rhinorrhea or nasal congestion or ear ache.  She admitted to sore throat with cough as well as headache.  No dysuria, oliguria or hematuria or flank pain.  She was seen by her PCP and was given 80 mg of Depo-Medrol and DuoNeb in the office before she was seen at [facility] ED.  She was given albuterol nebulizer as well as DuoNeb and 2 g of IV magnesium sulfate therapy and was still wheezing.    ED Course: Initially when she came to the ED, vital signs were within normal.  Later she was having elevated BP of 152/102 and respiratory rate of 22 with otherwise normal vital signs.  Labs revealed unremarkable BMP.  CBC showed leukocytosis of 13.6 with neutrophilia.  BN P was 15.6 influenza, COVID-19 and RSV PCR's came back negative. EKG as reviewed  by me : Atrial flutter with controlled ventricular sponsor of 82 with low voltage QRS and T wave inversion laterally. Imaging: Two-view chest x-ray showed no acute cardiopulmonary disease.  She is directly admitted to a progressive unit observation bed for further evaluation and management. PAST MEDICAL HISTORY:   Past Medical History:  Diagnosis Date   Allergy    Anxiety    Bronchitis 10/2016   Gastric ulcer    GERD (gastroesophageal reflux disease)    H/O measles    H/O mumps    Headache    history of migraines   History of chicken pox    Migraines    Panic attacks 2005   Shingles    Vitamin D deficiency    Wears partial dentures    upper partial    PAST SURGICAL HISTORY:   Past Surgical History:  Procedure Laterality Date   APPENDECTOMY     BUNIONECTOMY Right 05/01/12   Liane Comber, right foot   BUNIONECTOMY Left    CHOLECYSTECTOMY N/A 03/18/2013   Procedure: LAPAROSCOPIC CHOLECYSTECTOMY;  Surgeon: Harl Bowie, MD;  Location: [place];  Service: General;  Laterality: N/A;   COLONOSCOPY     CYSTOSCOPY N/A 03/19/2017   Procedure: Consuela Mimes;  Surgeon: Everett Graff, MD;  Location: [place] [facility];  Service: Gynecology;  Laterality: N/A;   DILATATION & CURRETTAGE/HYSTEROSCOPY WITH RESECTOCOPE N/A 09/07/2014   Procedure: DILATATION & CURETTAGE/HYSTEROSCOPY WITH RESECTOCOPE;  Surgeon: Everett Graff, MD;  Location: [facility];  Service: Gynecology;  Laterality: N/A;   ENDOMETRIAL BIOPSY  Rotator   IUD REMOVAL     LAPAROSCOPIC VAGINAL HYSTERECTOMY WITH SALPINGO OOPHORECTOMY Bilateral 03/19/2017   Procedure: LAPAROSCOPIC ASSISTED VAGINAL HYSTERECTOMY WITH SALPINGO OOPHORECTOMY;  Surgeon: Everett Graff, MD;  Location: [facility];  Service: Gynecology;  Laterality: Bilateral;  2.5 hours   Remove Implant Deep Right 05/01/12/   Right foot   ROTATOR CUFF REPAIR  02/09/2015   right   TUBAL LIGATION     UPPER GI ENDOSCOPY      SOCIAL HISTORY:   Social History   Tobacco Use    Smoking status: Never   Smokeless tobacco: Never  Substance Use Topics   Alcohol use: No    FAMILY HISTORY:   Family History  Problem Relation Age of Onset   Hypertension Mother    Diabetes Mother    Heart disease Sister    Hypertension Sister    Hypertension Brother    Cancer Maternal Grandmother        female cancer ? type   Colon cancer Maternal Grandfather    Heart disease Maternal Aunt    Heart attack Brother        x 3   Prostate cancer Maternal Uncle    Lung cancer Paternal Uncle    Clotting disorder Brother     DRUG ALLERGIES:   Allergies  Allergen Reactions   Codeine Nausea Only    REVIEW OF SYSTEMS:   ROS As per history of present illness. All pertinent systems were reviewed above. Constitutional, HEENT, cardiovascular, respiratory, GI, GU, musculoskeletal, neuro, psychiatric, endocrine, integumentary and hematologic systems were reviewed and are otherwise negative/unremarkable except for positive findings mentioned above in the HPI.   MEDICATIONS AT HOME:   Prior to Admission medications   Medication Sig Start Date End Date Taking? Authorizing Provider  albuterol (VENTOLIN HFA) 108 (90 Base) MCG/ACT inhaler Inhale 2 puffs into the lungs every 4 (four) hours as needed for wheezing or shortness of breath (cough, shortness of breath or wheezing.). 08/23/21   Clemon Chambers, MD  budesonide-formoterol [facility]) 80-4.5 MCG/ACT inhaler Start at onset of respiratory illness/asthma flare and use 2 puffs twice daily with spacer for 2 weeks or until symptoms resolve. Wash mouth out after use. 08/23/21   Clemon Chambers, MD  Coenzyme Q10 (COQ-10) 50 MG CAPS Take 50 mg by mouth daily.    [provider]  ibuprofen (ADVIL) 800 MG tablet Take 1 tablet (800 mg total) by mouth 3 (three) times daily with meals. 03/28/21   Vanessa Kick, MD  IBUPROFEN PO     [provider]  Multiple Vitamins-Minerals (ZINC PO) Take by mouth once daily as a supplement     [provider]  Omega-3 Fatty Acids (FISH OIL) 1200 MG CAPS Take 1,200 mg by mouth daily.    [provider]      VITAL SIGNS:  Blood pressure (!) 157/78, pulse 90, temperature 98.7 F (37.1 C), temperature source Oral, resp. rate (!) 22, height 5' 4"$  (1.626 m), weight 98.7 kg, last menstrual period 08/30/2014, SpO2 94 %.  PHYSICAL EXAMINATION:  Physical Exam  GENERAL:  67 y.o.-year-old patient lying in the bed with no acute distress however with some conversational dyspnea.  EYES: Pupils equal, round, reactive to light and accommodation. No scleral icterus. Extraocular muscles intact.  HEENT: Head atraumatic, normocephalic. Oropharynx and nasopharynx clear.  NECK:  Supple, no jugular venous distention. No thyroid enlargement, no tenderness.  LUNGS: Diffuse  expiratory wheezes with tight expiratory airflow and harsh vesicular breathing.. No use of accessory muscles of respiration.  CARDIOVASCULAR: Regular rate and rhythm, S1, S2 normal. No murmurs, rubs, or gallops.  ABDOMEN: Soft, nondistended, nontender. Bowel sounds present. No organomegaly or mass.  EXTREMITIES: No pedal edema, cyanosis, or clubbing.  NEUROLOGIC: Cranial nerves II through XII are intact. Muscle strength 5/5 in all extremities. Sensation intact. Gait not checked.  PSYCHIATRIC: The patient is alert and oriented x 3.  Normal affect and good eye contact. SKIN: No obvious rash, lesion, or ulcer.   LABORATORY PANEL:   CBC Recent Labs  Lab 04/23/22 1528  WBC 13.6*  HGB 13.5  HCT 41.6  PLT 419*   ------------------------------------------------------------------------------------------------------------------  Chemistries  Recent Labs  Lab 04/23/22 1528  NA 138  K 3.9  CL 101  CO2 26  GLUCOSE 87  BUN 7*  CREATININE 0.66  CALCIUM 9.8   ------------------------------------------------------------------------------------------------------------------  Cardiac Enzymes No results for  input(s): "TROPONINI" in the last 168 hours. ------------------------------------------------------------------------------------------------------------------  RADIOLOGY:  DG Chest 2 View  Result Date: 04/23/2022 CLINICAL DATA:  Sob EXAM: CHEST - 2 VIEW COMPARISON:  01/24/2018. FINDINGS: The heart size and mediastinal contours are within normal limits. Both lungs are clear. No pneumothorax or pleural effusion. Aorta is calcified. There are thoracic degenerative changes. IMPRESSION: No active cardiopulmonary disease. Electronically Signed   By: Sammie Bench M.D.   On: 04/23/2022 16:15      IMPRESSION AND PLAN:  Assessment and Plan: * Severe persistent asthma with exacerbation - The patient will be admitted to a progressive unit observation bed. - We will continue bronchodilator therapy with DuoNebs 4 times daily and every 4 hours as needed. - We will continue steroid therapy with IV Solu-Medrol. - Mucolytic therapy will be provided. - O2 protocol will be followed. - We will hold off long-acting beta agonist for now and resume Singulair.  Atrial flutter with controlled response ([place]) - Given elevated blood pressure we will place her on p.o. Cardizem CD. - We will obtain a 2D echo and cardiology consult. - I notified Gay Filler about the patient.  Generalized anxiety disorder with panic attacks - She will be placed on as needed Xanax while she is here.  GERD without esophagitis - We will continue H2 blocker therapy as well as PPI.  DVT prophylaxis: Lovenox.  Advanced Care Planning:  Code Status: full code.  Family Communication:  The plan of care was discussed in details with the patient (and family). I answered all questions. The patient agreed to proceed with the above mentioned plan. Further management will depend upon hospital course. Disposition Plan: Back to previous home environment Consults called: none.  All the records are reviewed and case discussed with ED  provider.  Status is: Observation   I certify that at the time of admission, it is my clinical judgment that the patient will require hospital care extending less than 2 midnights.                            Dispo: The patient is from: Home              Anticipated d/c is to: Home              Patient currently is not medically stable to d/c.              Difficult to place patient: No  Christel Mormon M.D on 04/23/2022  at 11:01 PM  [facility]   From 7 PM-7 AM, contact night-coverage [email or web address]  CC: Primary care physician; Leeroy Cha, MD

## 2022-04-23 NOTE — Plan of Care (Signed)
[facility] will assume care on arrival to accepting facility. Until arrival, care as per EDP. However, [facility] available 24/7 for questions and assistance.  Nursing staff, please page [facility] Admits and Consults ([phone]) as soon as the patient arrives to the hospital.   

## 2022-04-23 NOTE — ED Notes (Signed)
Pt did not want a covid swab

## 2022-04-23 NOTE — Assessment & Plan Note (Addendum)
-   The patient will be admitted to a progressive unit observation bed. - We will continue bronchodilator therapy with DuoNebs 4 times daily and every 4 hours as needed. - We will continue steroid therapy with IV Solu-Medrol. - Mucolytic therapy will be provided. - O2 protocol will be followed. - We will hold off long-acting beta agonist for now and resume Singulair.

## 2022-04-23 NOTE — Assessment & Plan Note (Signed)
-   She will be placed on as needed Xanax while she is here.

## 2022-04-23 NOTE — Progress Notes (Signed)
PHARMACIST - PHYSICIAN ORDER COMMUNICATION  CONCERNING: P&T Medication Policy on Herbal Medications  DESCRIPTION:  This patient's order for:  co-Q 10  has been noted.  This product(s) is classified as an "herbal" or natural product. Due to a lack of definitive safety studies or FDA approval, nonstandard manufacturing practices, plus the potential risk of unknown drug-drug interactions while on inpatient medications, the Pharmacy and Therapeutics Committee does not permit the use of "herbal" or natural products of this type within [facility].   ACTION TAKEN: The pharmacy department is unable to verify this order at this time and your patient has been informed of this safety policy. Please reevaluate patient's clinical condition at discharge and address if the herbal or natural product(s) should be resumed at that time.   Dolly Rias RPh 04/23/2022, 10:00 PM

## 2022-04-23 NOTE — ED Triage Notes (Signed)
PT here for sob x2 days. Pt has hx of asthma, states she has also had productive cough with thick white mucus. Pt saw her pcp today and was sent here. Pt states sob is worse w/ laying down. Pt denies cp, except for when coughing then chest feels tight.

## 2022-04-23 NOTE — ED Notes (Signed)
ED Provider at bedside. 

## 2022-04-23 NOTE — Assessment & Plan Note (Addendum)
-   Given elevated blood pressure we will place her on p.o. Cardizem CD. - We will obtain a 2D echo and cardiology consult. - I notified Gay Filler about the patient.

## 2022-04-23 NOTE — Progress Notes (Signed)
Peak flow pr TX. 140

## 2022-04-23 NOTE — ED Provider Notes (Signed)
[place] Provider Note   CSN: [id number] Arrival date & time: 04/23/22  1451     History {Add pertinent medical, surgical, social history, OB history to HPI:1} Chief Complaint  Patient presents with   Shortness of Breath    Kelly Abbott is a 67 y.o. female.   Shortness of Breath    This is a 67 year old female with medical history of persistent asthma presenting to the emergency department due to shortness of breath.  Patient is been feeling short of breath for the last week, she has been using her nebulizer every 4 hours with minimal improvement.  She is having a productive cough with white sputum, denies any chest pain.  2 days ago she started requiring multiple pillows to sleep -endorses orthopnea but no lower extremity swelling, fever, hypoxia.  Seen by her primary prior to ED arrival and given 80 mg of Depo-Medrol and had a DuoNeb treatment in the office.   Home Medications Prior to Admission medications   Medication Sig Start Date End Date Taking? Authorizing Provider  albuterol (VENTOLIN HFA) 108 (90 Base) MCG/ACT inhaler Inhale 2 puffs into the lungs every 4 (four) hours as needed for wheezing or shortness of breath (cough, shortness of breath or wheezing.). 08/23/21   Clemon Chambers, MD  budesonide-formoterol [facility]) 80-4.5 MCG/ACT inhaler Start at onset of respiratory illness/asthma flare and use 2 puffs twice daily with spacer for 2 weeks or until symptoms resolve. Wash mouth out after use. 08/23/21   Clemon Chambers, MD  Coenzyme Q10 (COQ-10) 50 MG CAPS Take 50 mg by mouth daily.    [provider]  ibuprofen (ADVIL) 800 MG tablet Take 1 tablet (800 mg total) by mouth 3 (three) times daily with meals. 03/28/21   Vanessa Kick, MD  IBUPROFEN PO     [provider]  Multiple Vitamins-Minerals (ZINC PO) Take by mouth once daily as a supplement    [provider]  Omega-3 Fatty Acids (FISH  OIL) 1200 MG CAPS Take 1,200 mg by mouth daily.    [provider]      Allergies    Codeine    Review of Systems   Review of Systems  Respiratory:  Positive for shortness of breath.     Physical Exam Updated Vital Signs BP (!) 142/79 (BP Location: Left Arm)   Pulse 81   Temp 98.2 F (36.8 C)   Resp 18   Ht 5' 4"$  (1.626 m)   Wt 98.9 kg   LMP 08/30/2014 (Exact Date)   SpO2 95%   BMI 37.42 kg/m  Physical Exam Vitals and nursing note reviewed. Exam conducted with a chaperone present.  Constitutional:      Appearance: Normal appearance.  HENT:     Head: Normocephalic and atraumatic.  Eyes:     General: No scleral icterus.       Right eye: No discharge.        Left eye: No discharge.     Extraocular Movements: Extraocular movements intact.     Pupils: Pupils are equal, round, and reactive to light.  Cardiovascular:     Rate and Rhythm: Normal rate and regular rhythm.     Pulses: Normal pulses.     Heart sounds: Normal heart sounds. No murmur heard.    No friction rub. No gallop.     Comments: Regular rate and rhythm, upper and lower extremity pulses are symmetric bilaterally Pulmonary:     Effort: Pulmonary  effort is normal. Tachypnea present. No respiratory distress.     Breath sounds: Wheezing present.     Comments: Mild inspiratory wheeze with diffuse expiratory wheezing.  Unable to speak in complete sentences. Abdominal:     General: Abdomen is flat. Bowel sounds are normal. There is no distension.     Palpations: Abdomen is soft.     Tenderness: There is no abdominal tenderness.  Musculoskeletal:     Right lower leg: No edema.     Left lower leg: No edema.     Comments: No lower extremity edema  Skin:    General: Skin is warm and dry.     Coloration: Skin is not jaundiced.  Neurological:     Mental Status: She is alert. Mental status is at baseline.     Coordination: Coordination normal.     ED Results / Procedures / Treatments   Labs (all  labs ordered are listed, but only abnormal results are displayed) Labs Reviewed  RESP PANEL BY RT-PCR (RSV, FLU A&B, COVID)  RVPGX2  CBC WITH DIFFERENTIAL/PLATELET  BASIC METABOLIC PANEL    EKG None  Radiology No results found.  Procedures Procedures  {Document cardiac monitor, telemetry assessment procedure when appropriate:1}  Medications Ordered in ED Medications  albuterol (PROVENTIL) (2.5 MG/3ML) 0.083% nebulizer solution 10 mg (10 mg Nebulization Given 04/23/22 1508)  albuterol (PROVENTIL) (2.5 MG/3ML) 0.083% nebulizer solution (  Not Given 04/23/22 1514)  ipratropium (ATROVENT) nebulizer solution 0.5 mg (0.5 mg Nebulization Given 04/23/22 1508)    ED Course/ Medical Decision Making/ A&P Clinical Course as of 04/23/22 1601  Mon Apr 23, 2022  1554 EKG 12-Lead EKG shows sinus rhythm, patient is on cardiac monitoring per my interpretation she is in sinus rhythm. [HS]    Clinical Course User Index [HS] Sherrill Raring, PA-C   {   Click here for ABCD2, HEART and other calculatorsREFRESH Note before signing :1}                          Medical Decision Making Amount and/or Complexity of Data Reviewed Labs: ordered. Radiology: ordered.   ***  {Document critical care time when appropriate:1} {Document review of labs and clinical decision tools ie heart score, Chads2Vasc2 etc:1}  {Document your independent review of radiology images, and any outside records:1} {Document your discussion with family members, caretakers, and with consultants:1} {Document social determinants of health affecting pt's care:1} {Document your decision making why or why not admission, treatments were needed:1} Final Clinical Impression(s) / ED Diagnoses Final diagnoses:  None    Rx / DC Orders ED Discharge Orders     None

## 2022-04-23 NOTE — Assessment & Plan Note (Addendum)
-   We will continue H2 blocker therapy as well as PPI.

## 2022-04-24 ENCOUNTER — Observation Stay (HOSPITAL_BASED_OUTPATIENT_CLINIC_OR_DEPARTMENT_OTHER): Payer: Medicare PPO

## 2022-04-24 DIAGNOSIS — I428 Other cardiomyopathies: Secondary | ICD-10-CM | POA: Diagnosis not present

## 2022-04-24 DIAGNOSIS — J4551 Severe persistent asthma with (acute) exacerbation: Secondary | ICD-10-CM | POA: Diagnosis not present

## 2022-04-24 LAB — BASIC METABOLIC PANEL
Anion gap: 11 (ref 5–15)
BUN: 9 mg/dL (ref 8–23)
CO2: 23 mmol/L (ref 22–32)
Calcium: 8.9 mg/dL (ref 8.9–10.3)
Chloride: 101 mmol/L (ref 98–111)
Creatinine, Ser: 0.78 mg/dL (ref 0.44–1.00)
GFR, Estimated: >60 mL/min (ref >60)
Glucose, Bld: 180 mg/dL — ABNORMAL HIGH (ref 70–99)
Potassium: 4.1 mmol/L (ref 3.5–5.1)
Sodium: 135 mmol/L (ref 135–145)

## 2022-04-24 LAB — ECHOCARDIOGRAM COMPLETE
AR max vel: 3.71 cm2
AV Area VTI: 3.79 cm2
AV Area mean vel: 3.54 cm2
AV Mean grad: 7 mmHg
AV Peak grad: 12.4 mmHg
Ao pk vel: 1.76 m/s
Area-P 1/2: 4.49 cm2
Calc EF: 79.2 %
Est EF: >75
Height: 64 in
S' Lateral: 2.4 cm
Single Plane A2C EF: 75.2 %
Single Plane A4C EF: 82.5 %
Weight: 3481.5 oz

## 2022-04-24 LAB — CBC
HCT: 40.4 % (ref 36.0–46.0)
Hemoglobin: 12.8 g/dL (ref 12.0–15.0)
MCH: 27.1 pg (ref 26.0–34.0)
MCHC: 31.7 g/dL (ref 30.0–36.0)
MCV: 85.6 fL (ref 80.0–100.0)
Platelets: 401 10*3/uL — ABNORMAL HIGH (ref 150–400)
RBC: 4.72 MIL/uL (ref 3.87–5.11)
RDW: 15.9 % — ABNORMAL HIGH (ref 11.5–15.5)
WBC: 12.7 10*3/uL — ABNORMAL HIGH (ref 4.0–10.5)
nRBC: 0 % (ref 0.0–0.2)

## 2022-04-24 LAB — HIV ANTIBODY (ROUTINE TESTING W REFLEX): HIV Screen 4th Generation wRfx: NONREACTIVE

## 2022-04-24 LAB — MAGNESIUM: Magnesium: 2.3 mg/dL (ref 1.7–2.4)

## 2022-04-24 MED ORDER — IPRATROPIUM-ALBUTEROL 0.5-2.5 (3) MG/3ML IN SOLN
3.0000 mL | Freq: Three times a day (TID) | RESPIRATORY_TRACT | Status: DC
  Administered 2022-04-24 – 2022-04-25 (×2): 3 mL via RESPIRATORY_TRACT
  Filled 2022-04-24 (×2): qty 3

## 2022-04-24 MED ORDER — FLUTICASONE FUROATE-VILANTEROL 100-25 MCG/ACT IN AEPB
1.0000 | INHALATION_SPRAY | Freq: Every day | RESPIRATORY_TRACT | Status: DC
  Administered 2022-04-24 – 2022-04-25 (×2): 1 via RESPIRATORY_TRACT
  Filled 2022-04-24: qty 28

## 2022-04-24 NOTE — Progress Notes (Signed)
  Echocardiogram 2D Echocardiogram has been performed.  Kelly Abbott 04/24/2022, 11:11 AM

## 2022-04-24 NOTE — Progress Notes (Signed)
PROGRESS NOTE   Kelly Abbott  [id number] DOB: 1955-06-08 DOA: 04/23/2022 PCP: Leeroy Cha, MD  Brief Narrative:   67 year old black female BMI 37.5 (morbid obesity) Status post lap vag hys BTL TSO cystoscopy 2019 for postmenopausal bleeding Known mild persistent asthma followed by Dr. Novella Rob allergy asthma clinic--- hospitalized 04/29/2021 with reactive airway disease?  Undiagnosed asthma?  Hypoxia----spirometry = FEV1/FVC 109%, 112% post and diagnosed with mild persistent asthma at that time  Went to see PCP 2/19-found to be in respiratory distress, tripoding-Rx Depo-Medrol nebulizing given and patient went to [facility]  2/19 SOB X 2 days productive cough + white mucus Found to have WBC 13, platelet 419, BUN/creatinine 9/0.7 BNP 15 CXR no active cardiopulmonary disease COVID RSV flu all negative On monitors a flutter RVR  Rx Cardizem CD 120, Solu-Medrol 40 every 12, saline 100 cc/H Tussionex 5 every 12 as needed, Mucinex 600 twice daily, DuoNeb 3 mL 4 times daily   Hospital-Problem based course  Acute asthma exacerbation -Get peak flows, transition IV to oral prednisone 40 mg - Add flutter valve - Continue Mucinex 600 twice daily, Tussionex 5 mL every 12 as needed Continue-continue Breo Ellipta 1 puff daily (on discharge resume Symbicort), albuterol nebs/DuoNebs as needed - Should follow-up with allergist in the outpatient setting  Sinus tachycardia on admission - Telemetry reviewed and not flutter-no need for Cardizem therefore discontinue  BMI 37.5 Morbid obesity - Outpatient follow-up with PCP regarding this to weight loss  DVT prophylaxis: Lovenox Code Status: Full Family Communication: D/W husband at bedside Disposition:  Status is: Observation The patient will require care spanning > 2 midnights and should be moved to inpatient because:   Requires further improvement as his wheezing today     Subjective: Awake coherent no  distress EOMI Some wheeze-short of breath with ambulation but does not seem to require oxygen  Tells me never smoker-unlikely COPD-old notes reviewed  Objective: Vitals:   04/23/22 2046 04/23/22 2252 04/24/22 0041 04/24/22 0430  BP: (!) 157/78  (!) 143/71 136/69  Pulse: 90  96 81  Resp: (!) 22  18 16  $ Temp: 98.7 F (37.1 C)  98.5 F (36.9 C) 98.1 F (36.7 C)  TempSrc: Oral  Oral Oral  SpO2: 97% 94% 96% 96%  Weight: 98.7 kg     Height: 5' 4"$  (1.626 m)       Intake/Output Summary (Last 24 hours) at 04/24/2022 0743 Last data filed at 04/24/2022 0500 Gross per 24 hour  Intake 591.63 ml  Output --  Net 591.63 ml   Filed Weights   04/23/22 1514 04/23/22 2046  Weight: 98.9 kg 98.7 kg    Examination:  EOMI NCAT no focal deficit no icterus or pallor Mallampati 4 Wheeze bilaterally posterolaterally Abdomen soft no rebound no guarding S1-S2 sinus, sinus tach Neurologically intact power-reflexes and other deferred   Data Reviewed: personally reviewed   CBC    Component Value Date/Time   WBC 12.7 (H) 04/24/2022 0524   RBC 4.72 04/24/2022 0524   HGB 12.8 04/24/2022 0524   HGB 12.9 04/24/2017 1149   HGB 13.1 06/20/2016 0919   HCT 40.4 04/24/2022 0524   HCT 39.6 06/20/2016 0919   PLT 401 (H) 04/24/2022 0524   PLT 370 04/24/2017 1149   MCV 85.6 04/24/2022 0524   MCV 83 06/20/2016 0919   MCH 27.1 04/24/2022 0524   MCHC 31.7 04/24/2022 0524   RDW 15.9 (H) 04/24/2022 0524   RDW 16.0 (H) 06/20/2016 0919   LYMPHSABS 3.4  04/23/2022 1528   LYMPHSABS 2.6 06/20/2016 0919   MONOABS 0.8 04/23/2022 1528   EOSABS 0.6 (H) 04/23/2022 1528   EOSABS 0.1 06/20/2016 0919   BASOSABS 0.1 04/23/2022 1528   BASOSABS 0.0 06/20/2016 0919      Latest Ref Rng & Units 04/24/2022    5:24 AM 04/23/2022    3:28 PM 06/12/2020    3:31 PM  CMP  Glucose 70 - 99 mg/dL 180  87  97   BUN 8 - 23 mg/dL 9  7  7   $ Creatinine 0.44 - 1.00 mg/dL 0.78  0.66  0.74   Sodium 135 - 145 mmol/L 135  138  137    Potassium 3.5 - 5.1 mmol/L 4.1  3.9  3.8   Chloride 98 - 111 mmol/L 101  101  104   CO2 22 - 32 mmol/L 23  26  28   $ Calcium 8.9 - 10.3 mg/dL 8.9  9.8  9.2   Total Protein 6.5 - 8.1 g/dL   7.1   Total Bilirubin 0.3 - 1.2 mg/dL   0.6   Alkaline Phos 38 - 126 U/L   67   AST 15 - 41 U/L   20   ALT 0 - 44 U/L   29      Radiology Studies: DG Chest 2 View  Result Date: 04/23/2022 CLINICAL DATA:  Sob EXAM: CHEST - 2 VIEW COMPARISON:  01/24/2018. FINDINGS: The heart size and mediastinal contours are within normal limits. Both lungs are clear. No pneumothorax or pleural effusion. Aorta is calcified. There are thoracic degenerative changes. IMPRESSION: No active cardiopulmonary disease. Electronically Signed   By: Sammie Bench M.D.   On: 04/23/2022 16:15     Scheduled Meds:  diltiazem  120 mg Oral Daily   enoxaparin (LOVENOX) injection  40 mg Subcutaneous Q24H   guaiFENesin  600 mg Oral BID   ipratropium-albuterol  3 mL Nebulization QID   methylPREDNISolone (SOLU-MEDROL) injection  40 mg Intravenous Q12H   Followed by   Derrill Memo ON 04/25/2022] predniSONE  40 mg Oral Q breakfast   omega-3 acid ethyl esters  1 g Oral Daily   Continuous Infusions:  sodium chloride 100 mL/hr at 04/23/22 2336     LOS: 0 days   Time spent: [place], MD [facility] To contact the attending provider between 7A-7P or the covering provider during after hours 7P-7A, please log into the web site [email or web address] and access using universal [place] password for that web site. If you do not have the password, please call the hospital operator.  04/24/2022, 7:43 AM

## 2022-04-25 DIAGNOSIS — J4551 Severe persistent asthma with (acute) exacerbation: Secondary | ICD-10-CM | POA: Diagnosis not present

## 2022-04-25 DIAGNOSIS — K219 Gastro-esophageal reflux disease without esophagitis: Secondary | ICD-10-CM | POA: Diagnosis not present

## 2022-04-25 MED ORDER — HYDROCOD POLI-CHLORPHE POLI ER 10-8 MG/5ML PO SUER: 5.0000 mL | Freq: Two times a day (BID) | ORAL | 0 refills | Status: DC | PRN

## 2022-04-25 MED ORDER — IPRATROPIUM-ALBUTEROL 0.5-2.5 (3) MG/3ML IN SOLN: 3.0000 mL | Freq: Four times a day (QID) | RESPIRATORY_TRACT | 12 refills | Status: DC | PRN

## 2022-04-25 MED ORDER — GUAIFENESIN-DM 100-10 MG/5ML PO SYRP: 10.0000 mL | ORAL_SOLUTION | ORAL | 0 refills | Status: DC | PRN

## 2022-04-25 MED ORDER — BENZONATATE 100 MG PO CAPS: 100.0000 mg | ORAL_CAPSULE | Freq: Three times a day (TID) | ORAL | 0 refills | Status: DC | PRN

## 2022-04-25 MED ORDER — BUDESONIDE-FORMOTEROL FUMARATE 80-4.5 MCG/ACT IN AERO: INHALATION_SPRAY | RESPIRATORY_TRACT | 12 refills | Status: DC

## 2022-04-25 MED ORDER — ALBUTEROL SULFATE HFA 108 (90 BASE) MCG/ACT IN AERS: 2.0000 | INHALATION_SPRAY | RESPIRATORY_TRACT | 1 refills | Status: DC | PRN

## 2022-04-25 MED ORDER — PREDNISONE 20 MG PO TABS: 40.0000 mg | ORAL_TABLET | Freq: Every day | ORAL | 0 refills | Status: AC

## 2022-04-25 NOTE — Discharge Summary (Signed)
Physician Discharge Summary   Patient: Kelly Abbott MRN: [id number] DOB: 1955-04-22  Admit date:     04/23/2022  Discharge date: 04/25/22  Discharge Physician: Kelly Abbott   PCP: Kelly Cha, MD   Recommendations at discharge:  Please follow up with PCp in one week.  Please follow up with pulmonology if symptoms persist.   Discharge Diagnoses: Principal Problem:   Severe persistent asthma with exacerbation Active Problems:   Atrial flutter with controlled response (HCC)   GERD without esophagitis   Generalized anxiety disorder with panic attacks   Hospital Course:  67 year old black female BMI 37.5 (morbid obesity), Status post lap vag hys BTL TSO cystoscopy 2019 for postmenopausal bleeding , with Known mild persistent asthma followed by Dr. Novella Rob allergy asthma clinic--- hospitalized 04/29/2021 diagnosed with mild persistent asthma, presents with sob.    Assessment and Plan: * Severe persistent asthma with exacerbation Symptoms improved with steroids. Discharged on oral steroids and duonebs.  Cough medications  GERD without esophagitis Resume home meds.    Atrial flutter ruled out. Telemetry showed sinus tachycardia.      Consultants: none.  Procedures performed: none.   Disposition: Home Diet recommendation:  Discharge Diet Orders (From admission, onward)     Start     Ordered   04/25/22 0000  Diet - low sodium heart healthy        04/25/22 1121           Regular diet DISCHARGE MEDICATION: Allergies as of 04/25/2022       Reactions   Codeine Nausea Only        Medication List     STOP taking these medications    ibuprofen 800 MG tablet Commonly known as: ADVIL       TAKE these medications    albuterol 108 (90 Base) MCG/ACT inhaler Commonly known as: VENTOLIN HFA Inhale 2 puffs into the lungs every 4 (four) hours as needed for wheezing or shortness of breath (cough, shortness of breath or wheezing.).    benzonatate 100 MG capsule Commonly known as: Tessalon Perles Take 1 capsule (100 mg total) by mouth 3 (three) times daily as needed for cough.   budesonide-formoterol 80-4.5 MCG/ACT inhaler Commonly known as: Symbicort Start at onset of respiratory illness/asthma flare and use 2 puffs twice daily with spacer for 2 weeks or until symptoms resolve. Wash mouth out after use.   chlorpheniramine-HYDROcodone 10-8 MG/5ML Commonly known as: TUSSIONEX Take 5 mLs by mouth every 12 (twelve) hours as needed for cough.   CoQ-10 50 MG Caps Take 50 mg by mouth daily.   Fish Oil 1200 MG Caps Take 1,200 mg by mouth daily.   ipratropium-albuterol 0.5-2.5 (3) MG/3ML Soln Commonly known as: DUONEB Take 3 mLs by nebulization every 6 (six) hours as needed.   multivitamin with minerals Tabs tablet Take 1 tablet by mouth daily.   predniSONE 20 MG tablet Commonly known as: DELTASONE Take 2 tablets (40 mg total) by mouth daily with breakfast for 5 days. Start taking on: April 26, 2022 What changed: See the new instructions.   ZINC PO Take 1 tablet by mouth daily.        Follow-up Information     Kelly Cha, MD. Schedule an appointment as soon as possible for a visit in 1 week(s).   Specialty: Internal Medicine Contact information: [address] [place] [address] [phone]                Discharge Exam:  Filed Weights   04/23/22 1514 04/23/22 2046  Weight: 98.9 kg 98.7 kg   General exam: Appears calm and comfortable  Respiratory system: Clear to auscultation. Respiratory effort normal. Cardiovascular system: S1 & S2 heard, RRR. No JVD,  Gastrointestinal system: Abdomen is nondistended, soft and nontender.  Central nervous system: Alert and oriented. No focal neurological deficits. Extremities: Symmetric 5 x 5 power. Skin: No rashes, lesions or ulcers Psychiatry:  Mood & affect appropriate.    Condition at discharge: fair  The results  of significant diagnostics from this hospitalization (including imaging, microbiology, ancillary and laboratory) are listed below for reference.   Imaging Studies: ECHOCARDIOGRAM COMPLETE  Result Date: 04/24/2022    ECHOCARDIOGRAM REPORT   Patient Name:   Kelly Abbott Date of Exam: 04/24/2022 Medical Rec #:  [id number]               Height:       64.0 in Accession #:    [id number]              Weight:       217.6 lb Date of Birth:  Jan 28, 1956              BSA:          2.028 m Patient Age:    29 years                BP:           134/68 mmHg Patient Gender: F                       HR:           86 bpm. Exam Location:  Inpatient Procedure: 2D Echo, Cardiac Doppler and Color Doppler Indications:    I48.92* Unspecified atrial flutter  History:        Patient has no prior history of Echocardiogram examinations.                 Abnormal ECG, Arrythmias:Atrial Flutter;                 Signs/Symptoms:Shortness of Breath and Dyspnea.  Sonographer:    Roseanna Rainbow RDCS Referring Phys: [id number] JAN A MANSY  Sonographer Comments: Technically difficult study due to poor echo windows and patient is obese. Image acquisition challenging due to patient body habitus. IMPRESSIONS  1. Left ventricular ejection fraction, by estimation, is >75%. The left ventricle has hyperdynamic function. The left ventricle has no regional wall motion abnormalities. There is mild concentric left ventricular hypertrophy. Left ventricular diastolic parameters are consistent with Grade I diastolic dysfunction (impaired relaxation). Appears sinus at time of imaging.  2. Right ventricular systolic function is hyperdynamic. The right ventricular size is normal.  3. The mitral valve is grossly normal. No evidence of mitral valve regurgitation. No evidence of mitral stenosis.  4. The aortic valve was not well visualized. Aortic valve regurgitation is not visualized. Comparison(s): No prior Echocardiogram. FINDINGS  Left Ventricle: Elevated Valsalva  gradients appear to be related to hyperdynamic function. Left ventricular ejection fraction, by estimation, is >75%. The left ventricle has hyperdynamic function. The left ventricle has no regional wall motion abnormalities. The left ventricular internal cavity size was small. There is mild concentric left ventricular hypertrophy. Left ventricular diastolic parameters are consistent with Grade I diastolic dysfunction (impaired relaxation). Right Ventricle: The right ventricular size is normal. No increase in right ventricular wall thickness. Right ventricular systolic function is hyperdynamic. Left Atrium:  Left atrial size was normal in size. Right Atrium: Right atrial size was normal in size. Pericardium: There is no evidence of pericardial effusion. Mitral Valve: The mitral valve is grossly normal. No evidence of mitral valve regurgitation. No evidence of mitral valve stenosis. Tricuspid Valve: The tricuspid valve is normal in structure. Tricuspid valve regurgitation is not demonstrated. No evidence of tricuspid stenosis. Aortic Valve: The aortic valve was not well visualized. Aortic valve regurgitation is not visualized. Aortic valve mean gradient measures 7.0 mmHg. Aortic valve peak gradient measures 12.4 mmHg. Aortic valve area, by VTI measures 3.79 cm. Pulmonic Valve: The pulmonic valve was not well visualized. Pulmonic valve regurgitation is not visualized. No evidence of pulmonic stenosis. Aorta: The aortic root and ascending aorta are structurally normal, with no evidence of dilitation. IAS/Shunts: The interatrial septum was not well visualized.  LEFT VENTRICLE PLAX 2D LVIDd:         3.50 cm     Diastology LVIDs:         2.40 cm     LV e' medial:    6.09 cm/s LV PW:         1.10 cm     LV E/e' medial:  13.1 LV IVS:        1.20 cm     LV e' lateral:   9.46 cm/s LVOT diam:     2.30 cm     LV E/e' lateral: 8.4 LV SV:         111 LV SV Index:   55 LVOT Area:     4.15 cm  LV Volumes (MOD) LV vol d, MOD A2C:  69.3 ml LV vol d, MOD A4C: 52.4 ml LV vol s, MOD A2C: 17.2 ml LV vol s, MOD A4C: 9.2 ml LV SV MOD A2C:     52.1 ml LV SV MOD A4C:     52.4 ml LV SV MOD BP:      49.1 ml RIGHT VENTRICLE             IVC RV S prime:     16.30 cm/s  IVC diam: 1.90 cm TAPSE (M-mode): 2.5 cm LEFT ATRIUM             Index        RIGHT ATRIUM          Index LA diam:        2.80 cm 1.38 cm/m   RA Area:     9.36 cm LA Vol (A2C):   30.3 ml 14.94 ml/m  RA Volume:   18.30 ml 9.02 ml/m LA Vol (A4C):   42.0 ml 20.71 ml/m LA Biplane Vol: 38.7 ml 19.09 ml/m  AORTIC VALVE AV Area (Vmax):    3.71 cm AV Area (Vmean):   3.54 cm AV Area (VTI):     3.79 cm AV Vmax:           176.00 cm/s AV Vmean:          122.000 cm/s AV VTI:            0.294 m AV Peak Grad:      12.4 mmHg AV Mean Grad:      7.0 mmHg LVOT Vmax:         157.00 cm/s LVOT Vmean:        104.000 cm/s LVOT VTI:          0.268 m LVOT/AV VTI ratio: 0.91  AORTA Ao Root diam: 2.80 cm Ao Asc diam:  3.10 cm MITRAL VALVE MV Area (PHT): 4.49 cm     SHUNTS MV Decel Time: 169 msec     Systemic VTI:  0.27 m MV E velocity: 79.50 cm/s   Systemic Diam: 2.30 cm MV A velocity: 112.00 cm/s MV E/A ratio:  0.71 Rudean Haskell MD Electronically signed by Rudean Haskell MD Signature Date/Time: 04/24/2022/12:00:04 PM    Final    DG Chest 2 View  Result Date: 04/23/2022 CLINICAL DATA:  Sob EXAM: CHEST - 2 VIEW COMPARISON:  01/24/2018. FINDINGS: The heart size and mediastinal contours are within normal limits. Both lungs are clear. No pneumothorax or pleural effusion. Aorta is calcified. There are thoracic degenerative changes. IMPRESSION: No active cardiopulmonary disease. Electronically Signed   By: Sammie Bench M.D.   On: 04/23/2022 16:15    Microbiology: Results for orders placed or performed during the hospital encounter of 04/23/22  Resp panel by RT-PCR (RSV, Flu A&B, Covid) Anterior Nasal Swab     Status: None   Collection Time: 04/23/22  5:55 PM   Specimen: Anterior Nasal  Swab  Result Value Ref Range Status   SARS Coronavirus 2 by RT PCR NEGATIVE NEGATIVE Final    Comment: (NOTE) SARS-CoV-2 target nucleic acids are NOT DETECTED.  The SARS-CoV-2 RNA is generally detectable in upper respiratory specimens during the acute phase of infection. The lowest concentration of SARS-CoV-2 viral copies this assay can detect is 138 copies/mL. A negative result does not preclude SARS-Cov-2 infection and should not be used as the sole basis for treatment or other patient management decisions. A negative result may occur with  improper specimen collection/handling, submission of specimen other than nasopharyngeal swab, presence of viral mutation(s) within the areas targeted by this assay, and inadequate number of viral copies(<138 copies/mL). A negative result must be combined with clinical observations, patient history, and epidemiological information. The expected result is Negative.  Fact Sheet for Patients:  EntrepreneurPulse.com.au  Fact Sheet for Healthcare Providers:  IncredibleEmployment.be  This test is no t yet approved or cleared by the [place] FDA and  has been authorized for detection and/or diagnosis of SARS-CoV-2 by FDA under an Emergency Use Authorization (EUA). This EUA will remain  in effect (meaning this test can be used) for the duration of the COVID-19 declaration under Section 564(b)(1) of the Act, 21 U.S.C.section 360bbb-3(b)(1), unless the authorization is terminated  or revoked sooner.       Influenza A by PCR NEGATIVE NEGATIVE Final   Influenza B by PCR NEGATIVE NEGATIVE Final    Comment: (NOTE) The Xpert Xpress SARS-CoV-2/FLU/RSV plus assay is intended as an aid in the diagnosis of influenza from Nasopharyngeal swab specimens and should not be used as a sole basis for treatment. Nasal washings and aspirates are unacceptable for Xpert Xpress SARS-CoV-2/FLU/RSV testing.  Fact Sheet for  Patients: EntrepreneurPulse.com.au  Fact Sheet for Healthcare Providers: IncredibleEmployment.be  This test is not yet approved or cleared by the [place] FDA and has been authorized for detection and/or diagnosis of SARS-CoV-2 by FDA under an Emergency Use Authorization (EUA). This EUA will remain in effect (meaning this test can be used) for the duration of the COVID-19 declaration under Section 564(b)(1) of the Act, 21 U.S.C. section 360bbb-3(b)(1), unless the authorization is terminated or revoked.     Resp Syncytial Virus by PCR NEGATIVE NEGATIVE Final    Comment: (NOTE) Fact Sheet for Patients: EntrepreneurPulse.com.au  Fact Sheet for Healthcare Providers: IncredibleEmployment.be  This test is not yet approved or cleared by  the [employer] and has been authorized for detection and/or diagnosis of SARS-CoV-2 by FDA under an Emergency Use Authorization (EUA). This EUA will remain in effect (meaning this test can be used) for the duration of the COVID-19 declaration under Section 564(b)(1) of the Act, 21 U.S.C. section 360bbb-3(b)(1), unless the authorization is terminated or revoked.  Performed at [facility], [address] [place], [place] [address]     Labs: CBC: Recent Labs  Lab 04/23/22 1528 04/24/22 0524  WBC 13.6* 12.7*  NEUTROABS 8.7*  --   HGB 13.5 12.8  HCT 41.6 40.4  MCV 83.4 85.6  PLT 419* 123XX123*   Basic Metabolic Panel: Recent Labs  Lab 04/23/22 1528 04/24/22 0524  NA 138 135  K 3.9 4.1  CL 101 101  CO2 26 23  GLUCOSE 87 180*  BUN 7* 9  CREATININE 0.66 0.78  CALCIUM 9.8 8.9  MG  --  2.3   Liver Function Tests: No results for input(s): "AST", "ALT", "ALKPHOS", "BILITOT", "PROT", "ALBUMIN" in the last 168 hours. CBG: No results for input(s): "GLUCAP" in the last 168 hours.  Discharge time spent: 36 minutes.   Signed: Hosie Poisson,  MD [facility] 04/25/2022

## 2022-04-25 NOTE — Progress Notes (Signed)
  Transition of Care [facility]) Screening Note   Patient Details  Name: Kelly Abbott Date of Birth: Jun 27, 1955   Transition of Care [facility]) CM/SW Contact:    Dessa Phi, RN Phone Number: 04/25/2022, 11:46 AM    Transition of Care Department [facility]) has reviewed patient and no TOC needs have been identified at this time. We will continue to monitor patient advancement through interdisciplinary progression rounds. If new patient transition needs arise, please place a TOC consult.

## 2022-05-18 ENCOUNTER — Encounter: Payer: Self-pay | Admitting: Physician Assistant

## 2022-06-28 NOTE — Progress Notes (Signed)
06/29/2022 Kelly Abbott [id number] Oct 22, 1955  Referring provider: Lorenda Abbott,* Primary GI doctor: Kelly Abbott  ASSESSMENT AND PLAN:   Kelly Abbott was seen today for dysphagia.  Diagnoses and all orders for this visit:  Esophageal dysphagia with GERD/odynophagia Multiple symptoms, some pain with swallowing as well as issues with solids and liquids. Recent steroids and steroid inhaler use, empirically treat with Diflucan for possible infectious esophagitis from yeast Started on proton pump inhibitor once daily Will get barium swallow to evaluate for aspiration, achalasia, dysmotility, strictures due to her symptoms  Will schedule for endoscopy with possible dilatation and colonoscopy with Kelly Abbott at the [facility] Will schedule EGD. I discussed risks of EGD with patient today, including risk of sedation, bleeding or perforation.  Patient provides understanding and gave verbal consent to proceed.  History of adenomatous polyp of colon 3 years ago Brother diagnosed with colon cancer 2014, due 2019 Will schedule for colonoscopy at [facility] We have discussed the risks of bleeding, infection, perforation, medication reactions, and remote risk of death associated with colonoscopy. All questions were answered and the patient acknowledges these risk and wishes to proceed.  Mild persistent asthma, unspecified whether complicated No shortness of breath at this time Only on inhaler as needed.  Morbid obesity  Body mass index is 37.14 kg/m.  -Patient has been advised to make an attempt to improve diet and exercise patterns to aid in weight loss. -Recommended diet heavy in fruits and veggies and low in animal meats, cheeses, and dairy products, appropriate calorie intake     Patient Care Team: Kelly Ishihara, MD as PCP - General (Internal Medicine)  HISTORY OF PRESENT ILLNESS: 67 y.o. female with a past medical history of GERD, gastric ulcer, anxiety, asthma,  echo EF 75%, morbid obesity and others listed below presents for evaluation of dysphagia.   Brother with esophageal cancer, heavy smoker.  Another brother with colon cancer age 75 3 years ago. .  Colonoscopy - 02/20/13 - 5mm sigmoid adenoma, due in 2019 08/23/2014 barium swallow no mucosal irregularities, normal esophageal motility, trace reflux, small hiatal hernia no stricture 08/27/2014 EGD with Kelly Abbott grade a esophagitis, small reducible hiatal hernia, pathology chronic gastritis 09/05/2014 CT abdomen pelvis with contrast right lower abdominal pain no acute intra-abdominal or intrapelvic abnormalities, small hepatic and left renal cyst  04/25/2022 severe persistent asthma, given steroids and oral steroid.  Was having symptoms prior to that. Though possible atrial flutter but this was just tachycardia, normal echocardiogram.  Her breathing is okay, no chest pain.   Patient complains of difficulty swallowing for approximately 6-7 months.  The symptoms are gradually worsening. The dysphagia occurs with both solids and liquids, can have coughing with eating/drinking.  She indigestion and reflux. She is having bad taste and smell in her mouth. She has some throat discomfort upper throat and into her ears, has hoarseness. She feels intermittent lower esophageal dysphagia, can have regurgitation.  Can have nocturnal symptoms of regurgitation and acid into her throat, especially if she eats late.  She has had dark stools but no dark black stool.  She denies of  AB pain, nausea, vomiting.  Patient complains of AB bloating. Denies early satiety.  She denies unintentional weight loss. She is on pepcid as needed, will take at night occ.    Denies sinus issues/drainage, has drainage in her ears.  Denies history of neck surgery or radiation to the neck.  She was on steroid inhaler for only 3 weeks, on albuterol.  She  has history of constipation.   She denies blood thinner use.  She denies NSAID  use, was on ibuprofen as needed 800mg  once a month.   She denies ETOH use.   She denies tobacco use.  She denies drug use.    She  reports that she has never smoked. She has never used smokeless tobacco. She reports that she does not drink alcohol and does not use drugs.  RELEVANT LABS AND IMAGING: CBC    Component Value Date/Time   WBC 12.7 (H) 04/24/2022 0524   RBC 4.72 04/24/2022 0524   HGB 12.8 04/24/2022 0524   HGB 12.9 04/24/2017 1149   HGB 13.1 06/20/2016 0919   HCT 40.4 04/24/2022 0524   HCT 39.6 06/20/2016 0919   PLT 401 (H) 04/24/2022 0524   PLT 370 04/24/2017 1149   MCV 85.6 04/24/2022 0524   MCV 83 06/20/2016 0919   MCH 27.1 04/24/2022 0524   MCHC 31.7 04/24/2022 0524   RDW 15.9 (H) 04/24/2022 0524   RDW 16.0 (H) 06/20/2016 0919   LYMPHSABS 3.4 04/23/2022 1528   LYMPHSABS 2.6 06/20/2016 0919   MONOABS 0.8 04/23/2022 1528   EOSABS 0.6 (H) 04/23/2022 1528   EOSABS 0.1 06/20/2016 0919   BASOSABS 0.1 04/23/2022 1528   BASOSABS 0.0 06/20/2016 0919   Recent Labs    04/23/22 1528 04/24/22 0524  HGB 13.5 12.8    CMP     Component Value Date/Time   NA 135 04/24/2022 0524   K 4.1 04/24/2022 0524   CL 101 04/24/2022 0524   CO2 23 04/24/2022 0524   GLUCOSE 180 (H) 04/24/2022 0524   BUN 9 04/24/2022 0524   CREATININE 0.78 04/24/2022 0524   CREATININE 0.78 04/24/2017 1148   CREATININE 0.76 06/20/2016 0919   CALCIUM 8.9 04/24/2022 0524   PROT 7.1 06/12/2020 1531   ALBUMIN 3.6 06/12/2020 1531   AST 20 06/12/2020 1531   AST 16 04/24/2017 1148   ALT 29 06/12/2020 1531   ALT 14 04/24/2017 1148   ALKPHOS 67 06/12/2020 1531   BILITOT 0.6 06/12/2020 1531   BILITOT 0.5 04/24/2017 1148   GFRNONAA >60 04/24/2022 0524   GFRNONAA >60 04/24/2017 1148   GFRNONAA 85 06/20/2016 0919   GFRAA >60 01/24/2018 1752   GFRAA >60 04/24/2017 1148   GFRAA >89 06/20/2016 0919      Latest Ref Rng & Units 06/12/2020    3:31 PM 04/24/2017   11:48 AM 06/20/2016    9:19 AM   Hepatic Function  Total Protein 6.5 - 8.1 g/dL 7.1  7.6  6.7   Albumin 3.5 - 5.0 g/dL 3.6  3.8  3.9   AST 15 - 41 U/L 20  16  13    ALT 0 - 44 U/L 29  14  11    Alk Phosphatase 38 - 126 U/L 67  84  80   Total Bilirubin 0.3 - 1.2 mg/dL 0.6  0.5  0.4       Current Medications:     Current Outpatient Medications (Respiratory):    albuterol (VENTOLIN HFA) 108 (90 Base) MCG/ACT inhaler, Inhale 2 puffs into the lungs every 4 (four) hours as needed for wheezing or shortness of breath (cough, shortness of breath or wheezing.).    Current Outpatient Medications (Other):    Coenzyme Q10 (COQ-10) 50 MG CAPS, Take 50 mg by mouth daily.   fluconazole (DIFLUCAN) 200 MG tablet, Take 2 tablets (400 mg total) by mouth daily for 1 day, THEN 1 tablet (200  mg total) daily for 13 days.   Multiple Vitamin (MULTIVITAMIN WITH MINERALS) TABS tablet, Take 1 tablet by mouth daily.   Multiple Vitamins-Minerals (ZINC PO), Take 1 tablet by mouth daily.   Na Sulfate-K Sulfate-Mg Sulf 17.5-3.13-1.6 GM/177ML SOLN, Take 1 kit by mouth once for 1 dose.   Omega-3 Fatty Acids (FISH OIL) 1200 MG CAPS, Take 1,200 mg by mouth daily.   pantoprazole (PROTONIX) 40 MG tablet, Take 1 tablet (40 mg total) by mouth daily.   famotidine (PEPCID) 20 MG tablet, TAKE 1 TABLET BY MOUTH AT BEDTIME AS NEEDED FOR 30 DAYS  Medical History:  Past Medical History:  Diagnosis Date   Allergy    Anxiety    Bronchitis 10/2016   Gastric ulcer    GERD (gastroesophageal reflux disease)    H/O measles    H/O mumps    Headache    history of migraines   History of chicken pox    Migraines    Panic attacks 2005   Shingles    Vitamin D deficiency    Wears partial dentures    upper partial   Allergies:  Allergies  Allergen Reactions   Codeine Nausea Only   Fluticasone-Umeclidin-Vilant Itching     Surgical History:  She  has a past surgical history that includes Appendectomy; Tubal ligation; Bunionectomy (Right, 05/01/12); Remove  Implant Deep (Right, 05/01/12/); Colonoscopy; Cholecystectomy (N/A, 03/18/2013); Endometrial biopsy (Rotator); Bunionectomy (Left); Upper gi endoscopy; Dilatation & currettage/hysteroscopy with resectoscope (N/A, 09/07/2014); Rotator cuff repair (02/09/2015); IUD removal; Laparoscopic vaginal hysterectomy with salpingo oophorectomy (Bilateral, 03/19/2017); and Cystoscopy (N/A, 03/19/2017). Family History:  Her family history includes Cancer in her maternal grandmother; Clotting disorder in her brother; Colon cancer in her maternal grandfather; Diabetes in her mother; Heart attack in her brother; Heart disease in her maternal aunt and sister; Hypertension in her brother, mother, and sister; Lung cancer in her paternal uncle; Prostate cancer in her maternal uncle.  REVIEW OF SYSTEMS  : All other systems reviewed and negative except where noted in the History of Present Illness.  PHYSICAL EXAM: BP 128/88   Pulse 73   Ht 5\' 4"  (1.626 m)   Wt 216 lb 6 oz (98.1 kg)   LMP 08/30/2014 (Exact Date)   BMI 37.14 kg/m  General Appearance: Well nourished, in no apparent distress. Head:   Normocephalic and atraumatic. Eyes:  sclerae anicteric,conjunctive pink  Respiratory: Respiratory effort normal, BS equal bilaterally without rales, rhonchi, wheezing. Cardio: RRR with no MRGs. Peripheral pulses intact.  Abdomen: Soft,  Obese ,active bowel sounds. mild tenderness in the epigastrium. Without guarding and Without rebound. No masses. Rectal: Not evaluated Musculoskeletal: Full ROM, Normal gait. Without edema. Skin:  Dry and intact without significant lesions or rashes Neuro: Alert and  oriented x4;  No focal deficits. Psych:  Cooperative. Normal mood and affect.    Doree Albee, PA-C 3:16 PM

## 2022-06-29 ENCOUNTER — Telehealth: Payer: Self-pay

## 2022-06-29 ENCOUNTER — Ambulatory Visit: Payer: Medicare PPO | Admitting: Physician Assistant

## 2022-06-29 ENCOUNTER — Encounter: Payer: Self-pay | Admitting: Physician Assistant

## 2022-06-29 VITALS — BP 128/88 | HR 73 | Ht 64.0 in | Wt 216.4 lb

## 2022-06-29 DIAGNOSIS — R1319 Other dysphagia: Secondary | ICD-10-CM

## 2022-06-29 DIAGNOSIS — K2100 Gastro-esophageal reflux disease with esophagitis, without bleeding: Secondary | ICD-10-CM

## 2022-06-29 DIAGNOSIS — Z86010 Personal history of colonic polyps: Secondary | ICD-10-CM | POA: Diagnosis not present

## 2022-06-29 DIAGNOSIS — J4530 Mild persistent asthma, uncomplicated: Secondary | ICD-10-CM

## 2022-06-29 DIAGNOSIS — Z800 Family history of malignant neoplasm of digestive organs: Secondary | ICD-10-CM | POA: Diagnosis not present

## 2022-06-29 MED ORDER — FLUCONAZOLE 200 MG PO TABS: ORAL_TABLET | ORAL | 0 refills | Status: AC

## 2022-06-29 MED ORDER — NA SULFATE-K SULFATE-MG SULF 17.5-3.13-1.6 GM/177ML PO SOLN: 1.0000 | Freq: Once | ORAL | 0 refills | Status: AC

## 2022-06-29 MED ORDER — PANTOPRAZOLE SODIUM 40 MG PO TBEC: 40.0000 mg | DELAYED_RELEASE_TABLET | Freq: Every day | ORAL | 3 refills | Status: AC

## 2022-06-29 NOTE — Telephone Encounter (Signed)
Called patient and let her know to start Miralax a week prior to her procedure.

## 2022-06-29 NOTE — Progress Notes (Signed)
I agree with the assessment and plan as outlined by Ms. Collier. 

## 2022-06-29 NOTE — Patient Instructions (Signed)
_______________________________________________________  If your blood pressure at your visit was 140/90 or greater, please contact your primary care physician to follow up on this.  _______________________________________________________  If you are age 68 or older, your body mass index should be between 23-30. Your Body mass index is 37.14 kg/m. If this is out of the aforementioned range listed, please consider follow up with your Primary Care Provider.  If you are age 50 or younger, your body mass index should be between 19-25. Your Body mass index is 37.14 kg/m. If this is out of the aformentioned range listed, please consider follow up with your Primary Care Provider.   You have been scheduled for a Barium Esophogram at [facility] (1st floor of the hospital) on 07/02/22 at 1pm. Please arrive 30 minutes prior to your appointment for registration. Make certain not to have anything to eat or drink 3 hours prior to your test. If you need to reschedule for any reason, please contact radiology at [phone] to do so. __________________________________________________________________ A barium swallow is an examination that concentrates on views of the esophagus. This tends to be a double contrast exam (barium and two liquids which, when combined, create a gas to distend the wall of the oesophagus) or single contrast (non-ionic iodine based). The study is usually tailored to your symptoms so a good history is essential. Attention is paid during the study to the form, structure and configuration of the esophagus, looking for functional disorders (such as aspiration, dysphagia, achalasia, motility and reflux) EXAMINATION You may be asked to change into a gown, depending on the type of swallow being performed. A radiologist and radiographer will perform the procedure. The radiologist will advise you of the type of contrast selected for your procedure and direct you during the exam. You will be  asked to stand, sit or lie in several different positions and to hold a small amount of fluid in your mouth before being asked to swallow while the imaging is performed .In some instances you may be asked to swallow barium coated marshmallows to assess the motility of a solid food bolus. The exam can be recorded as a digital or video fluoroscopy procedure. POST PROCEDURE It will take 1-2 days for the barium to pass through your system. To facilitate this, it is important, unless otherwise directed, to increase your fluids for the next 24-48hrs and to resume your normal diet.  This test typically takes about 30 minutes to perform. __________________________________________________________________________________   ________________________________________________________  The [place] providers would like to encourage you to use [facility] to communicate with providers for non-urgent requests or questions.  Due to long hold times on the telephone, sending your provider a message by [facility] may be a faster and more efficient way to get a response.  Please allow 48 business hours for a response.  Please remember that this is for non-urgent requests.   It was a pleasure to see you today!  Thank you for trusting me with your gastrointestinal care!

## 2022-07-02 ENCOUNTER — Ambulatory Visit (HOSPITAL_COMMUNITY)
Admission: RE | Admit: 2022-07-02 | Discharge: 2022-07-02 | Disposition: A | Discharge status: Home / Self Care | Payer: Medicare PPO | Source: Ambulatory Visit | Attending: Physician Assistant | Admitting: Physician Assistant

## 2022-07-02 DIAGNOSIS — R1319 Other dysphagia: Secondary | ICD-10-CM | POA: Diagnosis present

## 2022-07-02 DIAGNOSIS — K2100 Gastro-esophageal reflux disease with esophagitis, without bleeding: Secondary | ICD-10-CM

## 2022-07-03 ENCOUNTER — Other Ambulatory Visit: Payer: Self-pay | Admitting: Internal Medicine

## 2022-07-26 DIAGNOSIS — Z01419 Encounter for gynecological examination (general) (routine) without abnormal findings: Secondary | ICD-10-CM | POA: Diagnosis not present

## 2022-07-26 DIAGNOSIS — Z1231 Encounter for screening mammogram for malignant neoplasm of breast: Secondary | ICD-10-CM | POA: Diagnosis not present

## 2022-08-03 ENCOUNTER — Telehealth: Payer: Self-pay | Admitting: Internal Medicine

## 2022-08-03 NOTE — Telephone Encounter (Signed)
PT is calling to confirm the status of her pre cert. Please advise.

## 2022-08-03 NOTE — Telephone Encounter (Signed)
PT is calling to confirm instructions to take Miralax a week before her egd/colon. Please advise.

## 2022-08-03 NOTE — Telephone Encounter (Signed)
Left VM for patient letting her know to start Miralax starting a week before her procedure.

## 2022-08-04 ENCOUNTER — Encounter (HOSPITAL_COMMUNITY): Payer: Self-pay | Admitting: Emergency Medicine

## 2022-08-04 ENCOUNTER — Other Ambulatory Visit: Payer: Self-pay

## 2022-08-04 ENCOUNTER — Ambulatory Visit (HOSPITAL_COMMUNITY)
Admission: EM | Admit: 2022-08-04 | Discharge: 2022-08-04 | Disposition: A | Discharge status: Home / Self Care | Payer: Medicare PPO | Attending: Orthopedic Surgery | Admitting: Orthopedic Surgery

## 2022-08-04 DIAGNOSIS — R21 Rash and other nonspecific skin eruption: Secondary | ICD-10-CM | POA: Diagnosis not present

## 2022-08-04 MED ORDER — PREDNISONE 10 MG PO TABS: 10.0000 mg | ORAL_TABLET | Freq: Every day | ORAL | 0 refills | Status: DC

## 2022-08-04 MED ORDER — DEXAMETHASONE SODIUM PHOSPHATE 10 MG/ML IJ SOLN
INTRAMUSCULAR | Status: AC
  Filled 2022-08-04: qty 1

## 2022-08-04 MED ORDER — DEXAMETHASONE SODIUM PHOSPHATE 10 MG/ML IJ SOLN
10.0000 mg | Freq: Once | INTRAMUSCULAR | Status: AC
  Administered 2022-08-04: 10 mg via INTRAMUSCULAR

## 2022-08-04 MED ORDER — HYDROXYZINE HCL 25 MG PO TABS: 25.0000 mg | ORAL_TABLET | Freq: Four times a day (QID) | ORAL | 0 refills | Status: DC | PRN

## 2022-08-04 NOTE — ED Triage Notes (Addendum)
Patient has had issues with rash starting a few weeks ago.  Has seen provider, no diagnosis.  Provider gave hydroxyzine and thought symptoms improving.  Rash to torso and neck has worsened over the past 4-5 days.  Reports scalp is itching too.  Reports used Cortizone cream, calamine.  No one else at home has rash.  Rash itches.

## 2022-08-04 NOTE — Discharge Instructions (Signed)
Please take prednisone as prescribed along with hydroxyzine.  You may use topical hydrocortisone cream as needed for additional anti-itch relief.  Return to the urgent care for any worsening symptoms or any urgent changes in your health

## 2022-08-04 NOTE — ED Provider Notes (Signed)
[facility]    CSN: [id number] Arrival date & time: 08/04/22  1100      History   Chief Complaint Chief Complaint  Patient presents with   Rash    HPI Kelly Abbott is a 67 y.o. female.  Presents to the urgent care for evaluation of skin rash.  She has had skin rash for a few weeks.  She describes intermittent itching rash along her chest arms and neck.  No facial swelling, wheezing, chest pain shortness of breath or angioedema.  She denies any new soaps detergents foods medications or chemicals that are applied to her body.  She was given some hydroxyzine with some relief but has ran out.  She does admit to having some stress and is uncertain if stress could be a factor.  Patient appears well, and feels well.  She has no sore throat, chills body aches headaches nausea vomiting  HPI  Past Medical History:  Diagnosis Date   Allergy    Anxiety    Bronchitis 10/2016   Gastric ulcer    GERD (gastroesophageal reflux disease)    H/O measles    H/O mumps    Headache    history of migraines   History of chicken pox    Migraines    Panic attacks 2005   Shingles    Vitamin D deficiency    Wears partial dentures    upper partial    Patient Active Problem List   Diagnosis Date Noted   Severe persistent asthma with exacerbation 04/23/2022   GERD without esophagitis 04/23/2022   Generalized anxiety disorder with panic attacks 04/23/2022   Atrial flutter with controlled response (HCC) 04/23/2022   Mild persistent asthma 08/23/2021   Post-menopausal bleeding 03/19/2017   Abnormal CBC 03/12/2017   Menorrhagia with irregular cycle 03/12/2017   Grief reaction with prolonged bereavement 06/22/2016   Gastroesophageal reflux disease with esophagitis 06/20/2016   Abdominal pain, epigastric 06/20/2016   Nausea without vomiting 06/20/2016   Right shoulder pain 12/24/2014   Elevated platelet count 11/17/2014   RLQ abdominal pain 10/21/2014   BMI 35.0-35.9,adult  09/02/2012   Status post foot joint surgery 06/24/2012   Metatarsal deformity 06/03/2012   Vitamin D deficiency    Panic attacks     Past Surgical History:  Procedure Laterality Date   APPENDECTOMY     BUNIONECTOMY Right 05/01/12   Eliberto Ivory, right foot   BUNIONECTOMY Left    CHOLECYSTECTOMY N/A 03/18/2013   Procedure: LAPAROSCOPIC CHOLECYSTECTOMY;  Surgeon: Shelly Rubenstein, MD;  Location: [facility];  Service: General;  Laterality: N/A;   COLONOSCOPY     CYSTOSCOPY N/A 03/19/2017   Procedure: Bluford Kaufmann;  Surgeon: Osborn Coho, MD;  Location: [facility];  Service: Gynecology;  Laterality: N/A;   DILATATION & CURRETTAGE/HYSTEROSCOPY WITH RESECTOCOPE N/A 09/07/2014   Procedure: DILATATION & CURETTAGE/HYSTEROSCOPY WITH RESECTOCOPE;  Surgeon: Osborn Coho, MD;  Location: [facility];  Service: Gynecology;  Laterality: N/A;   ENDOMETRIAL BIOPSY  Rotator   IUD REMOVAL     LAPAROSCOPIC VAGINAL HYSTERECTOMY WITH SALPINGO OOPHORECTOMY Bilateral 03/19/2017   Procedure: LAPAROSCOPIC ASSISTED VAGINAL HYSTERECTOMY WITH SALPINGO OOPHORECTOMY;  Surgeon: Osborn Coho, MD;  Location: [facility];  Service: Gynecology;  Laterality: Bilateral;  2.5 hours   Remove Implant Deep Right 05/01/12/   Right foot   ROTATOR CUFF REPAIR  02/09/2015   right   TUBAL LIGATION     UPPER GI ENDOSCOPY      OB History     Gravida  2   Para  1   Term  1   Preterm      AB      Living  1      SAB      IAB      Ectopic      Multiple      Live Births  1            Home Medications    Prior to Admission medications   Medication Sig Start Date End Date Taking? Authorizing Provider  hydrOXYzine (ATARAX) 25 MG tablet Take 1 tablet (25 mg total) by mouth every 6 (six) hours as needed. 08/04/22  Yes Evon Slack, PA-C  predniSONE (DELTASONE) 10 MG tablet Take 1 tablet (10 mg total) by mouth daily. 6,5,4,3,2,1 six day taper 08/04/22  Yes Evon Slack, PA-C  albuterol (VENTOLIN HFA) 108 (90  Base) MCG/ACT inhaler INHALE 2 PUFFS BY MOUTH EVERY 4 HOURS AS NEEDED FOR COUGH, WHEEZING OR SHORTNESS OF BREATH 07/03/22   Verlee Monte, MD  Coenzyme Q10 (COQ-10) 50 MG CAPS Take 50 mg by mouth daily.    [provider]  famotidine (PEPCID) 20 MG tablet TAKE 1 TABLET BY MOUTH AT BEDTIME AS NEEDED FOR 30 DAYS    [provider]  Multiple Vitamin (MULTIVITAMIN WITH MINERALS) TABS tablet Take 1 tablet by mouth daily.    [provider]  Multiple Vitamins-Minerals (ZINC PO) Take 1 tablet by mouth daily.    [provider]  Omega-3 Fatty Acids (FISH OIL) 1200 MG CAPS Take 1,200 mg by mouth daily.    [provider]  pantoprazole (PROTONIX) 40 MG tablet Take 1 tablet (40 mg total) by mouth daily. 06/29/22   Doree Albee, PA-C    Family History Family History  Problem Relation Age of Onset   Hypertension Mother    Diabetes Mother    Heart disease Sister    Hypertension Sister    Hypertension Brother    Heart attack Brother        x 3   Clotting disorder Brother    Cancer Maternal Grandmother        female cancer ? type   Colon cancer Maternal Grandfather    Heart disease Maternal Aunt    Prostate cancer Maternal Uncle    Lung cancer Paternal Uncle     Social History Social History   Tobacco Use   Smoking status: Never   Smokeless tobacco: Never  Vaping Use   Vaping Use: Never used  Substance Use Topics   Alcohol use: No   Drug use: No     Allergies   Codeine and Fluticasone-umeclidin-vilant   Review of Systems Review of Systems   Physical Exam Triage Vital Signs ED Triage Vitals  Enc Vitals Group     BP 08/04/22 1317 118/79     Pulse Rate 08/04/22 1317 69     Resp 08/04/22 1317 20     Temp 08/04/22 1317 97.6 F (36.4 C)     Temp Source 08/04/22 1317 Oral     SpO2 08/04/22 1317 97 %     Weight --      Height --      Head Circumference --      Peak Flow --      Pain Score 08/04/22 1312 0     Pain Loc --       Pain Edu? --      Excl. in GC? --  No data found.  Updated Vital Signs BP 118/79 (BP Location: Right Arm) Comment (BP Location): large cuff  Pulse 69   Temp 97.6 F (36.4 C) (Oral)   Resp 20   LMP 08/30/2014 (Exact Date)   SpO2 97%   Visual Acuity Right Eye Distance:   Left Eye Distance:   Bilateral Distance:    Right Eye Near:   Left Eye Near:    Bilateral Near:     Physical Exam Constitutional:      Appearance: She is well-developed.  HENT:     Head: Normocephalic and atraumatic.  Eyes:     Conjunctiva/sclera: Conjunctivae normal.  Cardiovascular:     Rate and Rhythm: Normal rate.  Pulmonary:     Effort: Pulmonary effort is normal. No respiratory distress.  Musculoskeletal:        General: Normal range of motion.     Cervical back: Normal range of motion.  Skin:    General: Skin is warm.     Findings: Rash present.     Comments: Minimal maculopapular rash sporadic along the chest wall, right upper extremity and anterior soft tissues of the neck.  No facial swelling or angioedema  Neurological:     Mental Status: She is alert and oriented to person, place, and time.  Psychiatric:        Behavior: Behavior normal.        Thought Content: Thought content normal.      UC Treatments / Results  Labs (all labs ordered are listed, but only abnormal results are displayed) Labs Reviewed - No data to display  EKG   Radiology No results found.  Procedures Procedures (including critical care time)  Medications Ordered in UC Medications  dexamethasone (DECADRON) injection 10 mg (has no administration in time range)    Initial Impression / Assessment and Plan / UC Course  I have reviewed the triage vital signs and the nursing notes.  Pertinent labs & imaging results that were available during my care of the patient were reviewed by me and considered in my medical decision making (see chart for details).     67 year old female with pruritic rash.  No  signs of angioedema or anaphylaxis.  Her vital signs are stable.  Lungs are clear to auscultation.  Patient given IM dexamethasone x 1.  Patient will be placed on a steroid taper.  She was given hydroxyzine and she will use topical hydrocortisone cream.  Patient presents signs symptoms return to the urgent care for Final Clinical Impressions(s) / UC Diagnoses   Final diagnoses:  Rash     Discharge Instructions      Please take prednisone as prescribed along with hydroxyzine.  You may use topical hydrocortisone cream as needed for additional anti-itch relief.  Return to the urgent care for any worsening symptoms or any urgent changes in your health   ED Prescriptions     Medication Sig Dispense Auth. Provider   hydrOXYzine (ATARAX) 25 MG tablet Take 1 tablet (25 mg total) by mouth every 6 (six) hours as needed. 15 tablet Evon Slack, PA-C   predniSONE (DELTASONE) 10 MG tablet Take 1 tablet (10 mg total) by mouth daily. 6,5,4,3,2,1 six day taper 21 tablet Evon Slack, PA-C      PDMP not reviewed this encounter.   Evon Slack, PA-C 08/04/22 1329

## 2022-08-06 NOTE — Telephone Encounter (Signed)
Contacted patient and cleared all questions. Patient understood to start Miralax tonight up until her procedure.

## 2022-08-08 ENCOUNTER — Ambulatory Visit (AMBULATORY_SURGERY_CENTER): Payer: Medicare PPO | Admitting: Internal Medicine

## 2022-08-08 ENCOUNTER — Other Ambulatory Visit: Payer: Self-pay

## 2022-08-08 ENCOUNTER — Encounter: Payer: Self-pay | Admitting: Internal Medicine

## 2022-08-08 VITALS — BP 137/71 | HR 69 | Temp 97.3°F | Resp 15 | Ht 64.0 in | Wt 216.0 lb

## 2022-08-08 DIAGNOSIS — Z1211 Encounter for screening for malignant neoplasm of colon: Secondary | ICD-10-CM | POA: Diagnosis not present

## 2022-08-08 DIAGNOSIS — D122 Benign neoplasm of ascending colon: Secondary | ICD-10-CM

## 2022-08-08 DIAGNOSIS — Z800 Family history of malignant neoplasm of digestive organs: Secondary | ICD-10-CM

## 2022-08-08 DIAGNOSIS — D124 Benign neoplasm of descending colon: Secondary | ICD-10-CM

## 2022-08-08 DIAGNOSIS — K319 Disease of stomach and duodenum, unspecified: Secondary | ICD-10-CM | POA: Diagnosis not present

## 2022-08-08 DIAGNOSIS — F410 Panic disorder [episodic paroxysmal anxiety] without agoraphobia: Secondary | ICD-10-CM | POA: Diagnosis not present

## 2022-08-08 DIAGNOSIS — R1319 Other dysphagia: Secondary | ICD-10-CM

## 2022-08-08 DIAGNOSIS — K222 Esophageal obstruction: Secondary | ICD-10-CM

## 2022-08-08 DIAGNOSIS — K2990 Gastroduodenitis, unspecified, without bleeding: Secondary | ICD-10-CM

## 2022-08-08 DIAGNOSIS — D123 Benign neoplasm of transverse colon: Secondary | ICD-10-CM

## 2022-08-08 DIAGNOSIS — F419 Anxiety disorder, unspecified: Secondary | ICD-10-CM | POA: Diagnosis not present

## 2022-08-08 DIAGNOSIS — R1310 Dysphagia, unspecified: Secondary | ICD-10-CM | POA: Diagnosis not present

## 2022-08-08 MED ORDER — SODIUM CHLORIDE 0.9 % IV SOLN: 500.0000 mL | Freq: Once | INTRAVENOUS | Status: DC

## 2022-08-08 MED ORDER — PANTOPRAZOLE SODIUM 40 MG PO TBEC
40.0000 mg | DELAYED_RELEASE_TABLET | Freq: Two times a day (BID) | ORAL | 2 refills | Status: AC
Start: 2022-08-08 — End: 2022-11-06

## 2022-08-08 NOTE — Op Note (Signed)
[facility] Patient Name: Kelly Abbott Procedure Date: 08/08/2022 1:24 PM MRN: [id number] Endoscopist: Particia Lather , , [id number] Age: 67 Referring MD:  Date of Birth: [birth year] Gender: Female Account #: [id number] Procedure:                Colonoscopy Indications:              Screening patient at increased risk: Family history                            of 1st-degree relative with colorectal cancer at                            age 73 years (or older) Medicines:                Monitored Anesthesia Care Procedure:                Pre-Anesthesia Assessment:                           - Prior to the procedure, a History and Physical                            was performed, and patient medications and                            allergies were reviewed. The patient's tolerance of                            previous anesthesia was also reviewed. The risks                            and benefits of the procedure and the sedation                            options and risks were discussed with the patient.                            All questions were answered, and informed consent                            was obtained. Prior Anticoagulants: The patient has                            taken no anticoagulant or antiplatelet agents. ASA                            Grade Assessment: II - A patient with mild systemic                            disease. After reviewing the risks and benefits,                            the patient was deemed in satisfactory condition to  undergo the procedure.                           After obtaining informed consent, the colonoscope                            was passed under direct vision. Throughout the                            procedure, the patient's blood pressure, pulse, and                            oxygen saturations were monitored continuously. The                            Olympus SN [id number]  was introduced through the anus                            and advanced to the the cecum, identified by                            appendiceal orifice and ileocecal valve. The                            colonoscopy was performed without difficulty. The                            patient tolerated the procedure well. The quality                            of the bowel preparation was good. The ileocecal                            valve, appendiceal orifice, and rectum were                            photographed. Scope In: 1:49:53 PM Scope Out: 2:05:45 PM Scope Withdrawal Time: 0 hours 12 minutes 31 seconds  Total Procedure Duration: 0 hours 15 minutes 52 seconds  Findings:                 Eight sessile polyps were found in the descending                            colon, transverse colon and ascending colon. The                            polyps were 3 to 8 mm in size. These polyps were                            removed with a cold snare. Resection and retrieval                            were complete.  Non-bleeding internal hemorrhoids were found during                            retroflexion. Complications:            No immediate complications. Estimated Blood Loss:     Estimated blood loss was minimal. Impression:               - Eight 3 to 8 mm polyps in the descending colon,                            in the transverse colon and in the ascending colon,                            removed with a cold snare. Resected and retrieved.                           - Non-bleeding internal hemorrhoids. Recommendation:           - Discharge patient to home (with escort).                           - Await pathology results.                           - The findings and recommendations were discussed                            with the patient. Dr Particia Lather "[name]" [name],  08/08/2022 2:16:56 PM

## 2022-08-08 NOTE — Progress Notes (Signed)
Called to room to assist during endoscopic procedure.  Patient ID and intended procedure confirmed with present staff. Received instructions for my participation in the procedure from the performing physician.  

## 2022-08-08 NOTE — Progress Notes (Signed)
GASTROENTEROLOGY PROCEDURE H&P NOTE   Primary Care Physician: Lorenda Ishihara, MD    Reason for Procedure:   Dysphagia, family history of esophageal cancer, family history of colon cancer  Plan:    EGD/colonoscopy  Patient is appropriate for endoscopic procedure(s) in the ambulatory ([facility]) setting.  The nature of the procedure, as well as the risks, benefits, and alternatives were carefully and thoroughly reviewed with the patient. Ample time for discussion and questions allowed. The patient understood, was satisfied, and agreed to proceed.     HPI: Kelly Abbott is a 67 y.o. female who presents for EGD/colonoscopy for evaluation of dysphagia and family history of esophageal and colon cancer.  Patient was most recently seen in the Gastroenterology Clinic on 06/29/22.  No interval change in medical history since that appointment. Please refer to that note for full details regarding GI history and clinical presentation.   Past Medical History:  Diagnosis Date   Allergy    Anxiety    Bronchitis 10/2016   Gastric ulcer    GERD (gastroesophageal reflux disease)    H/O measles    H/O mumps    Headache    history of migraines   History of chicken pox    Migraines    Panic attacks 2005   Shingles    Vitamin D deficiency    Wears partial dentures    upper partial    Past Surgical History:  Procedure Laterality Date   APPENDECTOMY     BUNIONECTOMY Right 05/01/12   Eliberto Ivory, right foot   BUNIONECTOMY Left    CHOLECYSTECTOMY N/A 03/18/2013   Procedure: LAPAROSCOPIC CHOLECYSTECTOMY;  Surgeon: Shelly Rubenstein, MD;  Location: [facility];  Service: General;  Laterality: N/A;   COLONOSCOPY     CYSTOSCOPY N/A 03/19/2017   Procedure: Bluford Kaufmann;  Surgeon: Osborn Coho, MD;  Location: [facility];  Service: Gynecology;  Laterality: N/A;   DILATATION & CURRETTAGE/HYSTEROSCOPY WITH RESECTOCOPE N/A 09/07/2014   Procedure: DILATATION & CURETTAGE/HYSTEROSCOPY  WITH RESECTOCOPE;  Surgeon: Osborn Coho, MD;  Location: [facility];  Service: Gynecology;  Laterality: N/A;   ENDOMETRIAL BIOPSY  Rotator   IUD REMOVAL     LAPAROSCOPIC VAGINAL HYSTERECTOMY WITH SALPINGO OOPHORECTOMY Bilateral 03/19/2017   Procedure: LAPAROSCOPIC ASSISTED VAGINAL HYSTERECTOMY WITH SALPINGO OOPHORECTOMY;  Surgeon: Osborn Coho, MD;  Location: [facility];  Service: Gynecology;  Laterality: Bilateral;  2.5 hours   Remove Implant Deep Right 05/01/12/   Right foot   ROTATOR CUFF REPAIR  02/09/2015   right   TUBAL LIGATION     UPPER GI ENDOSCOPY      Prior to Admission medications   Medication Sig Start Date End Date Taking? Authorizing Provider  acetaminophen (TYLENOL) 325 MG tablet Take 650 mg by mouth every 4 (four) hours as needed.   Yes [provider]  Coenzyme Q10 (COQ-10) 50 MG CAPS Take 50 mg by mouth daily.   Yes [provider]  famotidine (PEPCID) 20 MG tablet TAKE 1 TABLET BY MOUTH AT BEDTIME AS NEEDED FOR 30 DAYS   Yes [provider]  Multiple Vitamin (MULTIVITAMIN WITH MINERALS) TABS tablet Take 1 tablet by mouth daily.   Yes [provider]  Multiple Vitamins-Minerals (ZINC PO) Take 1 tablet by mouth daily.   Yes [provider]  Omega-3 Fatty Acids (FISH OIL) 1200 MG CAPS Take 1,200 mg by mouth daily.   Yes [provider]  predniSONE (DELTASONE) 10 MG tablet Take 1 tablet (10 mg total) by mouth daily.  6,5,4,3,2,1 six day taper 08/04/22  Yes Evon Slack, PA-C  albuterol (VENTOLIN HFA) 108 (90 Base) MCG/ACT inhaler INHALE 2 PUFFS BY MOUTH EVERY 4 HOURS AS NEEDED FOR COUGH, WHEEZING OR SHORTNESS OF BREATH 07/03/22   Verlee Monte, MD  hydrOXYzine (ATARAX) 25 MG tablet Take 1 tablet (25 mg total) by mouth every 6 (six) hours as needed. 08/04/22   Evon Slack, PA-C  pantoprazole (PROTONIX) 40 MG tablet Take 1 tablet (40 mg total) by mouth daily. 06/29/22   Doree Albee, PA-C    Current Outpatient  Medications  Medication Sig Dispense Refill   acetaminophen (TYLENOL) 325 MG tablet Take 650 mg by mouth every 4 (four) hours as needed.     Coenzyme Q10 (COQ-10) 50 MG CAPS Take 50 mg by mouth daily.     famotidine (PEPCID) 20 MG tablet TAKE 1 TABLET BY MOUTH AT BEDTIME AS NEEDED FOR 30 DAYS     Multiple Vitamin (MULTIVITAMIN WITH MINERALS) TABS tablet Take 1 tablet by mouth daily.     Multiple Vitamins-Minerals (ZINC PO) Take 1 tablet by mouth daily.     Omega-3 Fatty Acids (FISH OIL) 1200 MG CAPS Take 1,200 mg by mouth daily.     predniSONE (DELTASONE) 10 MG tablet Take 1 tablet (10 mg total) by mouth daily. 6,5,4,3,2,1 six day taper 21 tablet 0   albuterol (VENTOLIN HFA) 108 (90 Base) MCG/ACT inhaler INHALE 2 PUFFS BY MOUTH EVERY 4 HOURS AS NEEDED FOR COUGH, WHEEZING OR SHORTNESS OF BREATH 18 g 0   hydrOXYzine (ATARAX) 25 MG tablet Take 1 tablet (25 mg total) by mouth every 6 (six) hours as needed. 15 tablet 0   pantoprazole (PROTONIX) 40 MG tablet Take 1 tablet (40 mg total) by mouth daily. 30 tablet 3   Current Facility-Administered Medications  Medication Dose Route Frequency Provider Last Rate Last Admin   0.9 %  sodium chloride infusion  500 mL Intravenous Once Imogene Burn, MD        Allergies as of 08/08/2022 - Review Complete 08/08/2022  Allergen Reaction Noted   Codeine Nausea Only 07/12/2011   Fluticasone-umeclidin-vilant Itching 06/29/2022    Family History  Problem Relation Age of Onset   Hypertension Mother    Diabetes Mother    Heart disease Sister    Hypertension Sister    Hypertension Brother    Heart attack Brother        x 3   Clotting disorder Brother    Heart disease Maternal Aunt    Prostate cancer Maternal Uncle    Lung cancer Paternal Uncle    Cancer Maternal Grandmother        female cancer ? type   Colon cancer Maternal Grandfather    Esophageal cancer Neg Hx    Stomach cancer Neg Hx    Rectal cancer Neg Hx     Social History    Socioeconomic History   Marital status: Married    Spouse name: Not on file   Number of children: 1   Years of education: 16   Highest education level: Not on file  Occupational History   Occupation: Secretary/administrator    Comment: [employer]  Tobacco Use   Smoking status: Never   Smokeless tobacco: Never  Vaping Use   Vaping Use: Never used  Substance and Sexual Activity   Alcohol use: No   Drug use: No   Sexual activity: Yes    Birth control/protection: None  Other  Topics Concern   Not on file  Social History Narrative   Fun: Bowling, shopping, workout   Denies religious beliefs effecting healthcare   Denies abuse and feels safe at home.    Social Determinants of Health   Financial Resource Strain: Not on file  Food Insecurity: Patient Declined (04/24/2022)   Hunger Vital Sign    Worried About Running Out of Food in the Last Year: Patient declined    Ran Out of Food in the Last Year: Patient declined  Transportation Needs: Not on file  Physical Activity: Not on file  Stress: Not on file  Social Connections: Not on file  Intimate Partner Violence: Not on file    Physical Exam: Vital signs in last 24 hours: BP 129/75   Pulse 63   Temp (!) 97.3 F (36.3 C)   Ht 5\' 4"  (1.626 m)   Wt 216 lb (98 kg)   LMP 08/30/2014 (Exact Date)   SpO2 98%   BMI 37.08 kg/m  GEN: NAD EYE: Sclerae anicteric ENT: MMM CV: Non-tachycardic Pulm: No increased WOB GI: Soft NEURO:  Alert & Oriented   Eulah Pont, MD [facility]   08/08/2022 1:31 PM

## 2022-08-08 NOTE — Progress Notes (Signed)
A and O x3. Report to RN. Tolerated MAC anesthesia well.Teeth unchanged after procedure. 

## 2022-08-08 NOTE — Op Note (Signed)
Kelly Abbott Patient Name: Kelly Abbott Procedure Date: 08/08/2022 1:24 PM MRN: [id number] Endoscopist: Particia Lather , , [id number] Age: 67 Referring MD:  Date of Birth: Mar 25, 1955 Gender: Female Account #: [id number] Procedure:                Upper GI endoscopy Indications:              Dysphagia Medicines:                Monitored Anesthesia Care Procedure:                Pre-Anesthesia Assessment:                           - Prior to the procedure, a History and Physical                            was performed, and patient medications and                            allergies were reviewed. The patient's tolerance of                            previous anesthesia was also reviewed. The risks                            and benefits of the procedure and the sedation                            options and risks were discussed with the patient.                            All questions were answered, and informed consent                            was obtained. Prior Anticoagulants: The patient has                            taken no anticoagulant or antiplatelet agents. ASA                            Grade Assessment: II - A patient with mild systemic                            disease. After reviewing the risks and benefits,                            the patient was deemed in satisfactory condition to                            undergo the procedure.                           After obtaining informed consent, the endoscope was  passed under direct vision. Throughout the                            procedure, the patient's blood pressure, pulse, and                            oxygen saturations were monitored continuously. The                            Olympus Scope [id number] was introduced through the                            mouth, and advanced to the second part of duodenum.                            The upper GI endoscopy was  accomplished without                            difficulty. The patient tolerated the procedure                            well. Scope In: Scope Out: Findings:                 One benign-appearing, intrinsic moderate                            (circumferential scarring or stenosis; an endoscope                            may pass) stenosis was found at the                            gastroesophageal junction. This stenosis measured                            less than one cm (in length). The stenosis was                            traversed. A TTS dilator was passed through the                            scope. Dilation with an 18-19-20 mm balloon dilator                            was performed to 20 mm. The dilation site was                            examined and showed mild mucosal disruption.                            Biopsies of the stricture and entire esophagus were  taken with a cold forceps for histology.                           A small hiatal hernia was present.                           Localized mild inflammation characterized by                            congestion (edema), erosions and erythema was found                            in the gastric antrum. Biopsies were taken with a                            cold forceps for histology.                           Localized mild inflammation characterized by                            congestion (edema) and erythema was found in the                            duodenal bulb. Complications:            No immediate complications. Estimated Blood Loss:     Estimated blood loss was minimal. Impression:               - Benign-appearing esophageal stenosis. Dilated.                            Biopsied.                           - Small hiatal hernia.                           - Gastritis. Biopsied.                           - Duodenitis. Recommendation:           - Await pathology results.                            - Use Protonix (pantoprazole) 40 mg PO BID for 4                            weeks, then decrease to QD                           - Perform a colonoscopy today.                           - Return to GI clinic in 8 weeks. Dr Particia Lather "Kelly Ripper" [name],  08/08/2022 2:13:53 PM

## 2022-08-08 NOTE — Patient Instructions (Addendum)
Per Dr. [name] resume normal diet. Handout on polyps given.  Use Protonix 40 mg twice daily for four weeks, then decrease to daily. Dr. Ladoris Gene office to call you with scheduled appointment.       YOU HAD AN ENDOSCOPIC PROCEDURE TODAY AT THE [facility]:   Refer to the procedure report that was given to you for any specific questions about what was found during the examination.  If the procedure report does not answer your questions, please call your gastroenterologist to clarify.  If you requested that your care partner not be given the details of your procedure findings, then the procedure report has been included in a sealed envelope for you to review at your convenience later.  YOU SHOULD EXPECT: Some feelings of bloating in the abdomen. Passage of more gas than usual.  Walking can help get rid of the air that was put into your GI tract during the procedure and reduce the bloating. If you had a lower endoscopy (such as a colonoscopy or flexible sigmoidoscopy) you may notice spotting of blood in your stool or on the toilet paper. If you underwent a bowel prep for your procedure, you may not have a normal bowel movement for a few days.  Please Note:  You might notice some irritation and congestion in your nose or some drainage.  This is from the oxygen used during your procedure.  There is no need for concern and it should clear up in a day or so.  SYMPTOMS TO REPORT IMMEDIATELY:  Following lower endoscopy (colonoscopy or flexible sigmoidoscopy):  Excessive amounts of blood in the stool  Significant tenderness or worsening of abdominal pains  Swelling of the abdomen that is new, acute  Fever of 100F or higher  Following upper endoscopy (EGD)  Vomiting of blood or coffee ground material  New chest pain or pain under the shoulder blades  Painful or persistently difficult swallowing  New shortness of breath  Fever of 100F or higher  Black, tarry-looking stools  For urgent or  emergent issues, a gastroenterologist can be reached at any hour by calling [phone]. Do not use MyChart messaging for urgent concerns.    DIET:  We do recommend a small meal at first, but then you may proceed to your regular diet.  Drink plenty of fluids but you should avoid alcoholic beverages for 24 hours.  ACTIVITY:  You should plan to take it easy for the rest of today and you should NOT DRIVE or use heavy machinery until tomorrow (because of the sedation medicines used during the test).    FOLLOW UP: Our staff will call the number listed on your records the next business day following your procedure.  We will call around 7:15- 8:00 am to check on you and address any questions or concerns that you may have regarding the information given to you following your procedure. If we do not reach you, we will leave a message.     If any biopsies were taken you will be contacted by phone or by letter within the next 1-3 weeks.  Please call us at [phone] if you have not heard about the biopsies in 3 weeks.    SIGNATURES/CONFIDENTIALITY: You and/or your care partner have signed paperwork which will be entered into your electronic medical record.  These signatures attest to the fact that that the information above on your After Visit Summary has been reviewed and is understood.  Full responsibility of the confidentiality of this  discharge information lies with you and/or your care-partner.

## 2022-08-09 ENCOUNTER — Telehealth: Payer: Self-pay | Admitting: *Deleted

## 2022-08-09 NOTE — Telephone Encounter (Signed)
  Follow up Call-     08/08/2022    1:08 PM  Call back number  Post procedure Call Back phone  # [phone]  Permission to leave phone message Yes     Patient questions:  Do you have a fever, pain , or abdominal swelling? No. Pain Score  0 *  Have you tolerated food without any problems? Yes.    Have you been able to return to your normal activities? Yes.    Do you have any questions about your discharge instructions: Diet   No. Medications  No. Follow up visit  No.  Do you have questions or concerns about your Care? No.  Actions: * If pain score is 4 or above: No action needed, pain <4.

## 2022-08-13 ENCOUNTER — Encounter: Payer: Self-pay | Admitting: Internal Medicine

## 2022-10-08 ENCOUNTER — Ambulatory Visit: Payer: Medicare PPO | Admitting: Internal Medicine

## 2022-10-16 ENCOUNTER — Other Ambulatory Visit: Payer: Self-pay | Admitting: Obstetrics and Gynecology

## 2022-10-16 DIAGNOSIS — L918 Other hypertrophic disorders of the skin: Secondary | ICD-10-CM | POA: Diagnosis not present

## 2022-10-30 DIAGNOSIS — Z1231 Encounter for screening mammogram for malignant neoplasm of breast: Secondary | ICD-10-CM | POA: Diagnosis not present

## 2022-10-30 DIAGNOSIS — K2100 Gastro-esophageal reflux disease with esophagitis, without bleeding: Secondary | ICD-10-CM | POA: Diagnosis not present

## 2022-10-30 DIAGNOSIS — Z8249 Family history of ischemic heart disease and other diseases of the circulatory system: Secondary | ICD-10-CM | POA: Diagnosis not present

## 2022-10-30 DIAGNOSIS — Z0000 Encounter for general adult medical examination without abnormal findings: Secondary | ICD-10-CM | POA: Diagnosis not present

## 2022-10-30 DIAGNOSIS — J4520 Mild intermittent asthma, uncomplicated: Secondary | ICD-10-CM | POA: Diagnosis not present

## 2022-10-30 DIAGNOSIS — Z8616 Personal history of COVID-19: Secondary | ICD-10-CM | POA: Diagnosis not present

## 2022-10-30 DIAGNOSIS — J45901 Unspecified asthma with (acute) exacerbation: Secondary | ICD-10-CM | POA: Diagnosis not present

## 2022-10-30 DIAGNOSIS — M179 Osteoarthritis of knee, unspecified: Secondary | ICD-10-CM | POA: Diagnosis not present

## 2023-01-19 ENCOUNTER — Ambulatory Visit (HOSPITAL_COMMUNITY)
Admission: EM | Admit: 2023-01-19 | Discharge: 2023-01-19 | Disposition: A | Discharge status: Home / Self Care | Payer: Medicare PPO | Attending: Physician Assistant | Admitting: Physician Assistant

## 2023-01-19 ENCOUNTER — Encounter (HOSPITAL_COMMUNITY): Payer: Self-pay | Admitting: Emergency Medicine

## 2023-01-19 DIAGNOSIS — F418 Other specified anxiety disorders: Secondary | ICD-10-CM | POA: Diagnosis not present

## 2023-01-19 DIAGNOSIS — L299 Pruritus, unspecified: Secondary | ICD-10-CM | POA: Diagnosis not present

## 2023-01-19 LAB — CBC WITH DIFFERENTIAL/PLATELET
Abs Immature Granulocytes: 0.05 10*3/uL (ref 0.00–0.07)
Basophils Absolute: 0 10*3/uL (ref 0.0–0.1)
Basophils Relative: 0 %
Eosinophils Absolute: 0.4 10*3/uL (ref 0.0–0.5)
Eosinophils Relative: 3 %
HCT: 40.7 % (ref 36.0–46.0)
Hemoglobin: 13.1 g/dL (ref 12.0–15.0)
Immature Granulocytes: 0 %
Lymphocytes Relative: 25 %
Lymphs Abs: 2.9 10*3/uL (ref 0.7–4.0)
MCH: 27.1 pg (ref 26.0–34.0)
MCHC: 32.2 g/dL (ref 30.0–36.0)
MCV: 84.1 fL (ref 80.0–100.0)
Monocytes Absolute: 0.8 10*3/uL (ref 0.1–1.0)
Monocytes Relative: 7 %
Neutro Abs: 7.3 10*3/uL (ref 1.7–7.7)
Neutrophils Relative %: 65 %
Platelets: 360 10*3/uL (ref 150–400)
RBC: 4.84 MIL/uL (ref 3.87–5.11)
RDW: 15.5 % (ref 11.5–15.5)
WBC: 11.4 10*3/uL — ABNORMAL HIGH (ref 4.0–10.5)
nRBC: 0 % (ref 0.0–0.2)

## 2023-01-19 LAB — COMPREHENSIVE METABOLIC PANEL
ALT: 15 U/L (ref 0–44)
AST: 18 U/L (ref 15–41)
Albumin: 3.5 g/dL (ref 3.5–5.0)
Alkaline Phosphatase: 71 U/L (ref 38–126)
Anion gap: 12 (ref 5–15)
BUN: 5 mg/dL — ABNORMAL LOW (ref 8–23)
CO2: 25 mmol/L (ref 22–32)
Calcium: 9.4 mg/dL (ref 8.9–10.3)
Chloride: 104 mmol/L (ref 98–111)
Creatinine, Ser: 0.97 mg/dL (ref 0.44–1.00)
GFR, Estimated: >60 mL/min (ref >60)
Glucose, Bld: 78 mg/dL (ref 70–99)
Potassium: 4.3 mmol/L (ref 3.5–5.1)
Sodium: 141 mmol/L (ref 135–145)
Total Bilirubin: 0.4 mg/dL (ref <1.2)
Total Protein: 7 g/dL (ref 6.5–8.1)

## 2023-01-19 LAB — TSH: TSH: 3.608 u[IU]/mL (ref 0.350–4.500)

## 2023-01-19 MED ORDER — HYDROXYZINE HCL 25 MG PO TABS: 25.0000 mg | ORAL_TABLET | Freq: Three times a day (TID) | ORAL | 0 refills | Status: AC | PRN

## 2023-01-19 NOTE — Discharge Instructions (Signed)
Take hydroxyzine up to 3 times a day.  This will make you sleepy so do not drive or drink alcohol while taking it.  Do not take Benadryl with this medication.  It is okay to take it just at night.  Switch to [facility] hypoallergenic soap.  I also recommend hypoallergenic emollient such as Aquaphor to help make sure your skin is appropriately hydrated.  Use hypoallergenic detergent as well.  I will contact you if any of your blood work is abnormal.  It is possible that your itching is related to anxiety.  If you start feeling overwhelmed please follow-up with the behavioral health urgent care as we discussed.  Follow-up with your primary care first thing next week.

## 2023-01-19 NOTE — ED Provider Notes (Signed)
[facility]    CSN: [id number] Arrival date & time: 01/19/23  1008      History   Chief Complaint Chief Complaint  Patient presents with   Anxiety   Pruritis    HPI Kelly Abbott is a 67 y.o. female.   Patient presents today for evaluation of generalized pruritus.  She reports this has been ongoing for a while but has gotten significantly worse in the past few weeks.  She has previously seen her primary care and they prescribed hydroxyzine which did temporarily eliminate symptoms.  She has run out of this medication and so has had a recurrence of symptoms.  She reports that symptoms tend to be worse when she is upset; either anxious or agitated.  She lost her brother a few months ago and this is following multiple other losses of close family members.  She is not taking any medication to manage anxiety and is not following with a therapist.  She does report that she has been short with her grandchildren and has no patience and this has been very upsetting for her.  She denies any history of liver, kidney, thyroid disease.  She denies history of diabetes.  She has not tried any over-the-counter medications for symptom management but has tried moisturizers and creams.  Denies any changes to her personal hygiene products including soaps or detergents.  She is status post cholecystectomy.  She denies any medication or dietary changes.    Past Medical History:  Diagnosis Date   Allergy    Anxiety    Bronchitis 10/2016   Gastric ulcer    GERD (gastroesophageal reflux disease)    H/O measles    H/O mumps    Headache    history of migraines   History of chicken pox    Migraines    Panic attacks 2005   Shingles    Vitamin D deficiency    Wears partial dentures    upper partial    Patient Active Problem List   Diagnosis Date Noted   Severe persistent asthma with exacerbation 04/23/2022   GERD without esophagitis 04/23/2022   Generalized anxiety disorder  with panic attacks 04/23/2022   Atrial flutter with controlled response (HCC) 04/23/2022   Mild persistent asthma 08/23/2021   Post-menopausal bleeding 03/19/2017   Abnormal CBC 03/12/2017   Menorrhagia with irregular cycle 03/12/2017   Grief reaction with prolonged bereavement 06/22/2016   Gastroesophageal reflux disease with esophagitis 06/20/2016   Abdominal pain, epigastric 06/20/2016   Nausea without vomiting 06/20/2016   Right shoulder pain 12/24/2014   Elevated platelet count 11/17/2014   RLQ abdominal pain 10/21/2014   BMI 35.0-35.9,adult 09/02/2012   Status post foot joint surgery 06/24/2012   Metatarsal deformity 06/03/2012   Vitamin D deficiency    Panic attacks     Past Surgical History:  Procedure Laterality Date   APPENDECTOMY     BUNIONECTOMY Right 05/01/12   Eliberto Ivory, right foot   BUNIONECTOMY Left    CHOLECYSTECTOMY N/A 03/18/2013   Procedure: LAPAROSCOPIC CHOLECYSTECTOMY;  Surgeon: Shelly Rubenstein, MD;  Location: [facility];  Service: General;  Laterality: N/A;   COLONOSCOPY     CYSTOSCOPY N/A 03/19/2017   Procedure: Bluford Kaufmann;  Surgeon: Osborn Coho, MD;  Location: [facility];  Service: Gynecology;  Laterality: N/A;   DILATATION & CURRETTAGE/HYSTEROSCOPY WITH RESECTOCOPE N/A 09/07/2014   Procedure: DILATATION & CURETTAGE/HYSTEROSCOPY WITH RESECTOCOPE;  Surgeon: Osborn Coho, MD;  Location: [facility];  Service: Gynecology;  Laterality: N/A;  ENDOMETRIAL BIOPSY  Rotator   IUD REMOVAL     LAPAROSCOPIC VAGINAL HYSTERECTOMY WITH SALPINGO OOPHORECTOMY Bilateral 03/19/2017   Procedure: LAPAROSCOPIC ASSISTED VAGINAL HYSTERECTOMY WITH SALPINGO OOPHORECTOMY;  Surgeon: Osborn Coho, MD;  Location: [facility];  Service: Gynecology;  Laterality: Bilateral;  2.5 hours   Remove Implant Deep Right 05/01/12/   Right foot   ROTATOR CUFF REPAIR  02/09/2015   right   TUBAL LIGATION     UPPER GI ENDOSCOPY      OB History     Gravida  2   Para  1   Term  1    Preterm      AB      Living  1      SAB      IAB      Ectopic      Multiple      Live Births  1            Home Medications    Prior to Admission medications   Medication Sig Start Date End Date Taking? Authorizing Provider  acetaminophen (TYLENOL) 325 MG tablet Take 650 mg by mouth every 4 (four) hours as needed.    [provider]  albuterol (VENTOLIN HFA) 108 (90 Base) MCG/ACT inhaler INHALE 2 PUFFS BY MOUTH EVERY 4 HOURS AS NEEDED FOR COUGH, WHEEZING OR SHORTNESS OF BREATH 07/03/22   Verlee Monte, MD  Coenzyme Q10 (COQ-10) 50 MG CAPS Take 50 mg by mouth daily.    [provider]  famotidine (PEPCID) 20 MG tablet TAKE 1 TABLET BY MOUTH AT BEDTIME AS NEEDED FOR 30 DAYS    [provider]  hydrOXYzine (ATARAX) 25 MG tablet Take 1 tablet (25 mg total) by mouth every 8 (eight) hours as needed. 01/19/23   Aryiana Klinkner, Noberto Retort, PA-C  Multiple Vitamin (MULTIVITAMIN WITH MINERALS) TABS tablet Take 1 tablet by mouth daily.    [provider]  Multiple Vitamins-Minerals (ZINC PO) Take 1 tablet by mouth daily.    [provider]  Omega-3 Fatty Acids (FISH OIL) 1200 MG CAPS Take 1,200 mg by mouth daily.    [provider]  pantoprazole (PROTONIX) 40 MG tablet Take 1 tablet (40 mg total) by mouth daily. 06/29/22   Doree Albee, PA-C  pantoprazole (PROTONIX) 40 MG tablet Take 1 tablet (40 mg total) by mouth 2 (two) times daily. Take 40 mg twice a day for four weeks, then decrease to daily. 08/08/22 11/06/22  Imogene Burn, MD    Family History Family History  Problem Relation Age of Onset   Hypertension Mother    Diabetes Mother    Heart disease Sister    Hypertension Sister    Hypertension Brother    Heart attack Brother        x 3   Clotting disorder Brother    Heart disease Maternal Aunt    Prostate cancer Maternal Uncle    Lung cancer Paternal Uncle    Cancer Maternal Grandmother        female cancer ? type   Colon  cancer Maternal Grandfather    Esophageal cancer Neg Hx    Stomach cancer Neg Hx    Rectal cancer Neg Hx     Social History Social History   Tobacco Use   Smoking status: Never   Smokeless tobacco: Never  Vaping Use   Vaping status: Never Used  Substance Use Topics   Alcohol use: No   Drug use: No  Allergies   Codeine and Fluticasone-umeclidin-vilant   Review of Systems Review of Systems  Constitutional:  Positive for activity change. Negative for appetite change, fatigue and fever.  Gastrointestinal:  Negative for abdominal pain, diarrhea, nausea and vomiting.  Musculoskeletal:  Negative for arthralgias and myalgias.  Skin:  Negative for rash.  Neurological:  Negative for weakness and numbness.  Psychiatric/Behavioral:  Positive for agitation. Negative for confusion, dysphoric mood, hallucinations, self-injury, sleep disturbance and suicidal ideas. The patient is nervous/anxious. The patient is not hyperactive.      Physical Exam Triage Vital Signs ED Triage Vitals  Encounter Vitals Group     BP 01/19/23 1021 (!) 145/88     Systolic BP Percentile --      Diastolic BP Percentile --      Pulse Rate 01/19/23 1021 77     Resp 01/19/23 1021 17     Temp 01/19/23 1021 98 F (36.7 C)     Temp Source 01/19/23 1021 Oral     SpO2 01/19/23 1021 (!) 81 %     Weight --      Height --      Head Circumference --      Peak Flow --      Pain Score 01/19/23 1027 0     Pain Loc --      Pain Education --      Exclude from Growth Chart --    No data found.  Updated Vital Signs BP (!) 145/88 (BP Location: Left Arm)   Pulse 87   Temp 98 F (36.7 C) (Oral)   Resp 17   LMP 08/30/2014 (Exact Date)   SpO2 98%   Visual Acuity Right Eye Distance:   Left Eye Distance:   Bilateral Distance:    Right Eye Near:   Left Eye Near:    Bilateral Near:     Physical Exam Vitals reviewed.  Constitutional:      General: She is awake. She is not in acute distress.     Appearance: Normal appearance. She is well-developed. She is not ill-appearing.     Comments: Very pleasant female appears stated age in no acute distress sitting comfortably in exam room  HENT:     Head: Normocephalic and atraumatic.     Mouth/Throat:     Pharynx: Uvula midline. No oropharyngeal exudate or posterior oropharyngeal erythema.  Cardiovascular:     Rate and Rhythm: Normal rate and regular rhythm.     Heart sounds: Normal heart sounds, S1 normal and S2 normal. No murmur heard. Pulmonary:     Effort: Pulmonary effort is normal.     Breath sounds: Normal breath sounds. No wheezing, rhonchi or rales.     Comments: Clear to auscultation bilaterally Abdominal:     Palpations: Abdomen is soft.     Tenderness: There is no abdominal tenderness.  Skin:    General: Skin is dry.     Findings: No rash.     Comments: No associated rash but there are several areas of excoriation particularly in bilateral antecubital region and lower back.  Psychiatric:        Attention and Perception: Attention normal.        Mood and Affect: Mood is anxious. Affect is tearful.        Behavior: Behavior is cooperative.     Comments: Maintains eye contact.  Appropriate speech.  Normal appearance/hygiene.  Tearful throughout exam.      UC Treatments / Results  Labs (all labs  ordered are listed, but only abnormal results are displayed) Labs Reviewed  CBC WITH DIFFERENTIAL/PLATELET  COMPREHENSIVE METABOLIC PANEL  TSH    EKG   Radiology No results found.  Procedures Procedures (including critical care time)  Medications Ordered in UC Medications - No data to display  Initial Impression / Assessment and Plan / UC Course  I have reviewed the triage vital signs and the nursing notes.  Pertinent labs & imaging results that were available during my care of the patient were reviewed by me and considered in my medical decision making (see chart for details).     Patient initially had an  oxygen recorded of 81% but on recheck this appears to have been entered incorrectly as it was 98%.  Patient denies any shortness of breath or chest pain.  She is well-appearing, afebrile, nontoxic, nontachycardic.  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  She was encouraged to use hypoallergenic soaps and detergents and apply emollient such as Aquaphor to manage the symptoms.  She has previously had success with hydroxyzine and so we will restart this medication as hopefully will help manage her anxiety as well as pruritus.  We discussed that it is sedating and so she should avoid driving or drinking alcohol with this medicine.  Given widespread and recurrent symptoms we will obtain basic blood work to rule out systemic causes including CBC, CMP, thyroid.  We will contact her if this is abnormal and changes our treatment plan.  We did discuss potential utility of establishing with a mental health specialist particularly as symptoms are exacerbated by anxiety.  She was given the information for behavioral health urgent care in case her symptoms worsen but also encouraged her to follow-up with her primary care for additional management.  Discussed that if she has any worsening or changing symptoms including development of rash, discoloration of her skin or eyes, increasing anxiety, fever, nausea, vomiting she should be seen emergently.  Strict return precautions given.  All questions answered to patient satisfaction.  Final Clinical Impressions(s) / UC Diagnoses   Final diagnoses:  Pruritus  Situational anxiety     Discharge Instructions      Take hydroxyzine up to 3 times a day.  This will make you sleepy so do not drive or drink alcohol while taking it.  Do not take Benadryl with this medication.  It is okay to take it just at night.  Switch to [facility] hypoallergenic soap.  I also recommend hypoallergenic emollient such as Aquaphor to help make sure your skin is appropriately hydrated.   Use hypoallergenic detergent as well.  I will contact you if any of your blood work is abnormal.  It is possible that your itching is related to anxiety.  If you start feeling overwhelmed please follow-up with the behavioral health urgent care as we discussed.  Follow-up with your primary care first thing next week.     ED Prescriptions     Medication Sig Dispense Auth. Provider   hydrOXYzine (ATARAX) 25 MG tablet Take 1 tablet (25 mg total) by mouth every 8 (eight) hours as needed. 30 tablet Darbi Chandran, Noberto Retort, PA-C      PDMP not reviewed this encounter.   Jeani Hawking, PA-C 01/19/23 1055

## 2023-01-19 NOTE — ED Triage Notes (Signed)
Pt c/o being very anxious and being itchy all over. She recently lost her brother and her anxiety has spiked.  States was on medication for anxiety about a year.

## 2023-01-23 ENCOUNTER — Ambulatory Visit (HOSPITAL_COMMUNITY)
Admission: EM | Admit: 2023-01-23 | Discharge: 2023-01-23 | Disposition: A | Discharge status: Home / Self Care | Payer: Medicare PPO | Attending: Psychiatry | Admitting: Psychiatry

## 2023-01-23 DIAGNOSIS — F411 Generalized anxiety disorder: Secondary | ICD-10-CM | POA: Insufficient documentation

## 2023-01-23 DIAGNOSIS — F419 Anxiety disorder, unspecified: Secondary | ICD-10-CM | POA: Diagnosis present

## 2023-01-23 DIAGNOSIS — Z634 Disappearance and death of family member: Secondary | ICD-10-CM | POA: Insufficient documentation

## 2023-01-23 DIAGNOSIS — F458 Other somatoform disorders: Secondary | ICD-10-CM | POA: Diagnosis not present

## 2023-01-23 DIAGNOSIS — Z636 Dependent relative needing care at home: Secondary | ICD-10-CM | POA: Diagnosis not present

## 2023-01-23 DIAGNOSIS — G4700 Insomnia, unspecified: Secondary | ICD-10-CM | POA: Insufficient documentation

## 2023-01-23 MED ORDER — TRAZODONE HCL 50 MG PO TABS: 50.0000 mg | ORAL_TABLET | Freq: Every evening | ORAL | 0 refills | Status: AC | PRN

## 2023-01-23 NOTE — ED Provider Notes (Signed)
Behavioral Health Urgent Care Medical Screening Exam  Patient Name: Kelly Abbott MRN: [id number] Date of Evaluation: 01/23/23 Chief Complaint:   Diagnosis:  Final diagnoses:  Anxiety state    History of Present illness: Kelly Abbott is a 66 y.o. female.   Patient presents to [facility] complaining of increased anxiety and insomnia. She reports that she has been experiencing difficulty falling and staying asleep. Reports that these symptoms are related to recent trauma as she has recently  lost many family members and currently taking care of her 5 year-old brother. She reports a hx of anxiety and depression with no current therapist or psychiatrist. Reports that her PCP prescribed her Vistaril for itching but it is not helping.  Patient reports that she has been experiencing mood swings and "being mean to people".  Patient reports that "I know this is not right but I told my friend about my anxiety and how I can't sleep, she gave me 2 pills of Xanax and they made me feel sooo good, I wish I can have those again".  Patient denies SI/HI/AVH.   Assessment: 66 year old female who is pleasant upon approach. Alert and oriented x 4. She is appropriately dressed with good hygiene. She does not seem to be responding to internal stimuli. She has good eye contact and her thought process is coherent/goal-directed.  Patient  reports that she has not been able to control her anxiety and vistaril is not helping. She reports that not being able to sleep is making it worse. Patient reports not having any therapist or psychiatrist and she is struggling with loss and grief. She reports that her PCP [facility]) prescribed her the Vistaril but she does not want to return there for psychiatric medications.  Patient reports that she currently lives with her husband who is supportive. She is motivated for outpatient services to discuss medications that can improve her mood and  sleep.   Patient  does not appear to be in any acute distress and expresses interest in receiving outpatient services. Different resources were provided  for therapy and medication management. Trazodone 50 mg PO HS PRN prescribed for sleep. Patient was encouraged to take both Trazodone and Vistaril together at bed time for improved sleep. Patient did not express any additional concerns.        Flowsheet Row ED from 01/23/2023 in [facility] ED from 01/19/2023 in [facility] at [facility] ED from 08/04/2022 in [facility] at [facility] RISK CATEGORY No Risk No Risk Error: Question 6 not populated       Psychiatric Specialty Exam  Presentation  General Appearance:Appropriate for Environment  Eye Contact:Good  Speech:Clear and Coherent  Speech Volume:Normal  Handedness:Right   Mood and Affect  Mood: Anxious; Hopeless  Affect: Appropriate   Thought Process  Thought Processes: Coherent; Goal Directed  Descriptions of Associations:Intact  Orientation:Full (Time, Place and Person)  Thought Content:Logical    Hallucinations:None  Ideas of Reference:None  Suicidal Thoughts:No  Homicidal Thoughts:No   Sensorium  Memory: Immediate Good; Recent Good; Remote Good  Judgment: Fair  Insight: Good   Executive Functions  Concentration: Fair  Attention Span: Fair  Recall: Fair  Fund of Knowledge: Fair  Language: Fair   Psychomotor Activity  Psychomotor Activity: Restlessness   Assets  Assets: Manufacturing systems engineer; Desire for Improvement; Social Support; Housing; Intimacy   Sleep  Sleep: Poor  Number of hours:  2   Physical Exam: Physical Exam Vitals and  nursing note reviewed.  Constitutional:      Appearance: Normal appearance.  HENT:     Head: Normocephalic and atraumatic.     Right Ear: Tympanic membrane normal.     Left Ear: Tympanic membrane normal.     Nose: Nose normal.      Mouth/Throat:     Mouth: Mucous membranes are moist.  Eyes:     Extraocular Movements: Extraocular movements intact.     Pupils: Pupils are equal, round, and reactive to light.  Cardiovascular:     Rate and Rhythm: Normal rate.  Pulmonary:     Effort: Pulmonary effort is normal.  Musculoskeletal:        General: Normal range of motion.     Cervical back: Normal range of motion.  Skin:    General: Skin is warm.  Neurological:     General: No focal deficit present.     Mental Status: She is alert and oriented to person, place, and time.    Review of Systems  Constitutional: Negative.   HENT: Negative.    Eyes: Negative.   Respiratory: Negative.    Cardiovascular: Negative.   Gastrointestinal: Negative.   Musculoskeletal: Negative.   Skin: Negative.   Neurological: Negative.   Endo/Heme/Allergies: Negative.   Psychiatric/Behavioral:  Positive for depression. The patient is nervous/anxious and has insomnia.    Blood pressure (!) 158/93, pulse 86, temperature 98.6 F (37 C), temperature source Oral, resp. rate 16, last menstrual period 08/30/2014, SpO2 99%. There is no height or weight on file to calculate BMI.  Musculoskeletal: Strength & Muscle Tone: within normal limits Gait & Station: normal Patient leans: N/A   [facility] MSE Discharge Disposition for Follow up and Recommendations: Based on my evaluation the patient does not appear to have an emergency medical condition and can be discharged with resources and follow up care in outpatient services for Medication Management, Individual Therapy, and Group Therapy   Olin Pia, NP 01/23/2023, 4:52 PM

## 2023-01-23 NOTE — Discharge Instructions (Addendum)

## 2023-01-23 NOTE — Progress Notes (Signed)
   01/23/23 1501  [facility] Triage Screening (Walk-ins at [facility] only)  How Did You Hear About [place]? Self  What Is the Reason for Your Visit/Call Today? Pt presents to [facility] voluntarily unaccompanied. Pt states that when her nerves gets the best of her, she gets to itching. Pt states that she is dealing with anxiety and unable to sleep. Pt states that she has had about 5 deaths of important people in her life in the past 2 years. Pt states that she is beginning to lash out at family members. Pt states that she takes hydroxyzine. Pt denies SI, HI, AVH and alcohol/drug use at this present time.  How Long Has This Been Causing You Problems? > than 6 months  Have You Recently Had Any Thoughts About Hurting Yourself? No  Are You Planning to Commit Suicide/Harm Yourself At This time? No  Have you Recently Had Thoughts About Hurting Someone Karolee Ohs? No  Are You Planning To Harm Someone At This Time? No  Are you currently experiencing any auditory, visual or other hallucinations? No  Have You Used Any Alcohol or Drugs in the Past 24 Hours? No  Do you have any current medical co-morbidities that require immediate attention? No  Clinician description of patient physical appearance/behavior: neatly dressed, calm, cooperative  What Do You Feel Would Help You the Most Today? Social Support;Medication(s);Treatment for Depression or other mood problem  If access to [facility]gent Care was not available, would you have sought care in the Emergency Department? No  Determination of Need Routine (7 days)  Options For Referral Medication Management;Outpatient Therapy

## 2023-01-23 NOTE — ED Notes (Signed)
Patient dc by provider

## 2023-02-07 DIAGNOSIS — F418 Other specified anxiety disorders: Secondary | ICD-10-CM | POA: Diagnosis not present

## 2023-02-07 DIAGNOSIS — F4323 Adjustment disorder with mixed anxiety and depressed mood: Secondary | ICD-10-CM | POA: Diagnosis not present

## 2023-02-07 DIAGNOSIS — F331 Major depressive disorder, recurrent, moderate: Secondary | ICD-10-CM | POA: Diagnosis not present

## 2023-02-12 DIAGNOSIS — F321 Major depressive disorder, single episode, moderate: Secondary | ICD-10-CM | POA: Diagnosis not present

## 2023-02-12 DIAGNOSIS — J4540 Moderate persistent asthma, uncomplicated: Secondary | ICD-10-CM | POA: Diagnosis not present

## 2023-02-12 DIAGNOSIS — L500 Allergic urticaria: Secondary | ICD-10-CM | POA: Diagnosis not present

## 2023-02-12 DIAGNOSIS — F419 Anxiety disorder, unspecified: Secondary | ICD-10-CM | POA: Diagnosis not present

## 2023-02-12 DIAGNOSIS — E785 Hyperlipidemia, unspecified: Secondary | ICD-10-CM | POA: Diagnosis not present

## 2023-02-12 DIAGNOSIS — Z8249 Family history of ischemic heart disease and other diseases of the circulatory system: Secondary | ICD-10-CM | POA: Diagnosis not present

## 2023-03-15 ENCOUNTER — Encounter: Payer: Self-pay | Admitting: Internal Medicine

## 2023-03-15 ENCOUNTER — Other Ambulatory Visit: Payer: Self-pay

## 2023-03-15 ENCOUNTER — Ambulatory Visit: Payer: Medicare PPO | Admitting: Internal Medicine

## 2023-03-15 VITALS — BP 138/80 | HR 86 | Temp 98.5°F | Resp 18 | Ht 64.0 in | Wt 233.6 lb

## 2023-03-15 DIAGNOSIS — L308 Other specified dermatitis: Secondary | ICD-10-CM | POA: Diagnosis not present

## 2023-03-15 DIAGNOSIS — J4530 Mild persistent asthma, uncomplicated: Secondary | ICD-10-CM | POA: Diagnosis not present

## 2023-03-15 MED ORDER — FLUOCINONIDE 0.05 % EX OINT: 1.0000 | TOPICAL_OINTMENT | Freq: Two times a day (BID) | CUTANEOUS | 0 refills | Status: DC

## 2023-03-15 MED ORDER — METHYLPREDNISOLONE ACETATE 40 MG/ML IJ SUSP
40.0000 mg | Freq: Once | INTRAMUSCULAR | Status: AC
  Administered 2023-03-15: 40 mg via INTRAMUSCULAR

## 2023-03-15 MED ORDER — FLUTICASONE-SALMETEROL 115-21 MCG/ACT IN AERO: 2.0000 | INHALATION_SPRAY | Freq: Two times a day (BID) | RESPIRATORY_TRACT | 12 refills | Status: AC

## 2023-03-15 NOTE — Progress Notes (Signed)
 FOLLOW UP Date of Service/Encounter:  03/15/23  Subjective:  Kelly Abbott (DOB: Dec 25, 1955) is a 68 y.o. female who [facility] in re-evaluation of the following: asthma and new compliant for rash and itchign  History obtained from: chart review and patient.  For Review, LV was on 08/23/2021 with Dr.Dennis seen for intial visit for asthma . See below for summary of history and diagnostics.   Therapeutic plans/changes recommended: Started on Symbicort  80 mcg 2 puffs twice daily ----------------------------------------------------- Pertinent History/Diagnostics:  Asthma: - Symbicort  started 08/23/21:  -Normal spirometry (08/23/2021): ratio 85, 1.42L, 72 FEV1 (pre) without reversibility  --------------------------------------------------- Today presents for follow-up. Discussed the use of AI scribe software for clinical note transcription with the patient, who gave verbal consent to proceed.  History of Present Illness   The patient, previously diagnosed with asthma, presents with a recent exacerbation of symptoms, including wheezing, particularly at night. She reports that her asthma has been better overall since her last visit, but she has experienced a few episodes of wheezing in the past couple of nights. The patient has been managing her symptoms with an albuterol  pump, which she uses as needed, approximately once or twice a month. She reports that the albuterol  effectively relieves her symptoms.  However, the patient has had two hospital admissions in the past year due to severe asthma attacks, during which she was treated with oral steroids. She was advised to restart her Symbicort  inhaler after her hospital stays, but she has not been taking it regularly due to a reluctance to feel dependent on medication.  In addition to her asthma, the patient has been experiencing a persistent itchy rash for the past four to five months.  The rash, which is located primarily on her arms and chest, comes and goes and individual spots can last for about a week. She has been treating the rash with over-the-counter cortisone cream and eczema creams, but these have provided only temporary relief. The patient has not identified any changes in her environment or personal care products that could be causing the rash.  No exacerbating or alleviating factors.  Lesions are fixed.       All medications reviewed by clinical staff and updated in chart. No new pertinent medical or surgical history except as noted in HPI.  ROS: All others negative except as noted per HPI.   Objective:  BP 138/80 (Cuff Size: Large)   Pulse 86   Temp 98.5 F (36.9 C)   Resp 18   Ht 5' 4 (1.626 m)   Wt 233 lb 9.6 oz (106 kg) Comment: with shoes  LMP 08/30/2014 (Exact Date)   SpO2 98%   BMI 40.10 kg/m  Body mass index is 40.1 kg/m. Physical Exam: General Appearance:  Alert, cooperative, no distress, appears stated age  Head:  Normocephalic, without obvious abnormality, atraumatic  Eyes:  Conjunctiva clear, EOM's intact  Ears EACs normal bilaterally  Nose: Nares normal, hypertrophic turbinates, normal mucosa, no visible anterior polyps, and septum midline  Throat: Lips, tongue normal; teeth and gums normal, normal posterior oropharynx  Neck: Supple, symmetrical  Lungs:   clear to auscultation bilaterally, Respirations unlabored, no coughing  Heart:  regular rate and rhythm and no murmur, Appears well perfused  Extremities: No edema  Skin: erythematous, dry patches scattered on arms trunk breast   Neurologic: No gross deficits   Labs:  Lab Orders  No laboratory test(s) ordered today    Spirometry:  Tracings  reviewed. Her effort: Good reproducible efforts. FVC: 1.78 L FEV1: 1.43 L, 73% predicted FEV1/FVC ratio: 80% Interpretation: Spirometry consistent with possible restrictive disease.  After 4 puffs of albuterol  FVC increased by 9.5%, FEV1  increased by 5.6%.  Is not a significant postbronchodilator response but does show partial reversibility please see scanned spirometry results for details.    Assessment/Plan   Mild Persistent Asthma: - your lung testing today: showed some inflammation in your lungs that was not fully reversible  - Controller Inhaler: Start Advair 115mcg 2 puffs twice a day; Use In Block Therapy-Start if having respiratory symptoms.  Use for at least 2 week or until symptoms resolve. - Rinse mouth out after use - Rescue Inhaler: Albuterol  (Proair /Ventolin ) 2 puffs . Use  every 4-6 hours as needed for chest tightness, wheezing, or coughing.  Can also use 15 minutes prior to exercise if you have symptoms with activity. - Asthma is not controlled if:  - Symptoms are occurring >2 times a week OR  - >2 times a month nighttime awakenings  - You are requiring systemic steroids (prednisone /steroid injections) more than once per year  - Your require hospitalization for your asthma. Please let us  know about any ED visits, hospitalizations or injectable or oral steroids given for your asthma.    Dermatitis Patient presents with itchy, flaky rash on arms and neck, present for 4-5 months. Over-the-counter cortisone provides temporary relief. Possible contact dermatitis. -Administer Depo 40mg  IM given today  -Start Lidexl ointment  use twice daily (avoiding face, armpits, and groin). -Plan for patch testing in 4 weeks to identify potential allergens, after inflammation has subsided. -Plan to reassess in 4-6 weeks, may consider dupixent if symptoms persistent   Follow up: 4-6 weeks   Other:  Depo 40mg  given today , reviewed spirometry technique, and reviewed inhaler technique  Thank you so much for letting me partake in your care today.  Don't hesitate to reach out if you have any additional concerns!  Hargis Springer, MD  [facility] [place], [place]

## 2023-03-15 NOTE — Patient Instructions (Addendum)
 Mild Persistent Asthma: - your lung testing today: showed some inflammation in your lungs that was not fully reversible  - Controller Inhaler: Start Advair 115mcg 2 puffs twice a day; Use In Block Therapy-Start if having respiratory symptoms.  Use for at least 2 week or until symptoms resolve. - Rinse mouth out after use - Rescue Inhaler: Albuterol  (Proair /Ventolin ) 2 puffs . Use  every 4-6 hours as needed for chest tightness, wheezing, or coughing.  Can also use 15 minutes prior to exercise if you have symptoms with activity. - Asthma is not controlled if:  - Symptoms are occurring >2 times a week OR  - >2 times a month nighttime awakenings  - You are requiring systemic steroids (prednisone /steroid injections) more than once per year  - Your require hospitalization for your asthma. Please let us  know about any ED visits, hospitalizations or injectable or oral steroids given for your asthma.    Dermatitis Patient presents with itchy, flaky rash on arms and neck, present for 4-5 months. Over-the-counter cortisone provides temporary relief. Possible contact dermatitis. -Administer Depo 40mg  IM given today  -Start Lidexl ointment  use twice daily (avoiding face, armpits, and groin). -Plan for patch testing in 4 weeks to identify potential allergens, after inflammation has subsided. -Plan to reassess in 4-6 weeks, may consider dupixent if symptoms persistent   Follow up: 4-6 weeks   Thank you so much for letting me partake in your care today.  Don't hesitate to reach out if you have any additional concerns!  Hargis Springer, MD  [facility] [place], [place]

## 2023-03-19 DIAGNOSIS — F321 Major depressive disorder, single episode, moderate: Secondary | ICD-10-CM | POA: Diagnosis not present

## 2023-03-19 DIAGNOSIS — E785 Hyperlipidemia, unspecified: Secondary | ICD-10-CM | POA: Diagnosis not present

## 2023-03-19 DIAGNOSIS — L209 Atopic dermatitis, unspecified: Secondary | ICD-10-CM | POA: Diagnosis not present

## 2023-03-19 DIAGNOSIS — J4551 Severe persistent asthma with (acute) exacerbation: Secondary | ICD-10-CM | POA: Diagnosis not present

## 2023-03-19 DIAGNOSIS — F411 Generalized anxiety disorder: Secondary | ICD-10-CM | POA: Diagnosis not present

## 2023-04-08 DIAGNOSIS — L811 Chloasma: Secondary | ICD-10-CM | POA: Diagnosis not present

## 2023-04-19 NOTE — Progress Notes (Signed)
    Follow-up Note  RE: Yexalen Deike MRN: [id number] DOB: Jun 12, 1955 Date of Office Visit: 04/22/2023  Primary care provider: Lorenda Ishihara, MD Referring provider: Caralyn Guile returns to the office today for the patch test placement, given suspected history of contact dermatitis.    Diagnostics:  TRUE Test patches placed.    Plan:   Suspected Allergic contact dermatitis  Discussed with patient that patch testing tests for contact dermatitis and sometimes it does not correlate to how one will react to  allergens   in the body. Positive patch testing results can help in avoiding those items however it is possible to get false negative results.  Nevertheless, this is one of the most accessible test for  contact dermatitis  currently available  - Instructions provided on care of the patches for the next 48 hours. Shareena Nusz was instructed to avoid showering for the next 48 hours. Kentrell Guettler will follow up in 48 hours and 96 hours for patch readings.     Tonny Bollman, MD [facility] [place]

## 2023-04-22 ENCOUNTER — Ambulatory Visit: Payer: Medicare PPO | Admitting: Internal Medicine

## 2023-04-22 ENCOUNTER — Encounter: Payer: Self-pay | Admitting: Internal Medicine

## 2023-04-22 DIAGNOSIS — L308 Other specified dermatitis: Secondary | ICD-10-CM | POA: Diagnosis not present

## 2023-04-24 ENCOUNTER — Encounter: Payer: Self-pay | Admitting: Allergy

## 2023-04-24 ENCOUNTER — Ambulatory Visit (INDEPENDENT_AMBULATORY_CARE_PROVIDER_SITE_OTHER): Payer: Medicare PPO | Admitting: Allergy

## 2023-04-24 DIAGNOSIS — L2389 Allergic contact dermatitis due to other agents: Secondary | ICD-10-CM

## 2023-04-24 NOTE — Progress Notes (Signed)
   Follow Up Note  RE: Kelly Abbott MRN: [id number] DOB: [birth year] Date of Office Visit: 04/24/2023  Referring provider: Lorenda Ishihara,* Primary care provider: Lorenda Ishihara, MD  History of Present Illness: I had the pleasure of seeing Kelly Abbott for a follow up visit at the [facility] [place] on 04/24/2023. She is a 68 y.o. female, who is being followed for dermatitis. Today she is here for initial patch test interpretation, given suspected history of contact dermatitis.   Diagnostics:  TRUE TEST 48 hour reading:   T.R.U.E. Test - 04/24/23 1000       Test Information   Time Antigen Placed 1016    Manufacturer Greer    Lot # [id number]    Location Back    Number of Test 36    Reading Interval Day 3    Panel Panel 1;Panel 2;Panel 3      Panel 1   1. Nickel Sulfate --   +/-   2. Wool Alcohols 0    3. Neomycin Sulfate 0    4. Potassium Dichromate 0    5. Caine Mix 0    6. Fragrance Mix 0    7. Colophony 0    8. Paraben Mix 0    9. Negative Control 0    10. Balsam of [place] 0    11. Ethylenediamine Dihydrochloride 0    12. Cobalt Dichloride 0      Panel 2   13. p-tert Butylphenol Formaldehyde Resin 0    14. Epoxy Resin 0    15. Carba Mix 0    16.  Black Rubber Mix 0    17. Cl+ Me-Isothiazolinone 0    18. Quaternium-15 0    19. Methyldibromo Glutaronitrile 0    20. p-Phenylenediamine 0    21. Formaldehyde 0    22. Mercapto Mix 0    23. Thimerosal 0    24. Thiuram Mix 0      Panel 3   25. Diazolidinyl Urea 0    26. Quinoline Mix 0    27. Tixocortol-21-Pivalate 0    28. Gold Sodium Thiosulfate 0    29. Imidazolidinyl Urea 0    30. Budesonide 0    31. Hydrocortisone-17-Butyrate 0    32. Mercaptobenzothiazole 0    33. Bacitracin 0    34. Parthenolide 0    35. Disperse Blue 106 0    36. 2-Bromo-2-Nitropropane-1,3-diol 0              Assessment and Plan: [name] is a 68 y.o. female with: Allergic contact  dermatitis due to other agents TRUE Patches removed and 48 hour reading was borderline positive to nickel. The patient has been provided detailed information regarding the substances she is sensitive to, as well as products containing the substances.  Meticulous avoidance of these substances is recommended.   Return in about 2 days (around 04/26/2023).  It was my pleasure to see [name] today and participate in her care. Please feel free to contact me with any questions or concerns.  Sincerely,  Wyline Mood, DO Allergy & Immunology  [facility] office: [phone] [facility] office: [phone] [place] office: [phone]

## 2023-04-24 NOTE — Addendum Note (Signed)
 Addended by: Ellamae Sia on: 04/24/2023 07:09 PM   Modules accepted: Level of Service

## 2023-04-26 ENCOUNTER — Encounter: Payer: Self-pay | Admitting: Internal Medicine

## 2023-04-26 ENCOUNTER — Other Ambulatory Visit: Payer: Self-pay

## 2023-04-26 ENCOUNTER — Ambulatory Visit: Payer: Medicare PPO | Admitting: Internal Medicine

## 2023-04-26 DIAGNOSIS — L2389 Allergic contact dermatitis due to other agents: Secondary | ICD-10-CM | POA: Diagnosis not present

## 2023-04-26 NOTE — Progress Notes (Signed)
   Follow Up Note  RE: Kelly Abbott MRN: [id number] DOB: 1955/08/08 Date of Office Visit: 04/26/2023  Referring provider: Lorenda Ishihara,* Primary care provider: Lorenda Ishihara, MD  History of Present Illness: I had the pleasure of seeing Kelly Abbott for a follow up visit at the [facility] [place] on 04/26/2023. She is a 68 y.o. female, who is being followed for allergic contact dermatitis . Today she is here for final patch test interpretation, given suspected history of contact dermatitis.   Diagnostics:  TRUE TEST 96 hour reading:   T.R.U.E. Test - 04/26/23 1100       Test Information   Time Antigen Placed 1016    Manufacturer Greer    Lot # [id number]    Location Back    Number of Test 36    Reading Interval Day 3    Panel Panel 1;Panel 2;Panel 3      Panel 1   1. Nickel Sulfate 1    2. Wool Alcohols 0    3. Neomycin Sulfate 0    4. Potassium Dichromate 0    5. Caine Mix 0    6. Fragrance Mix 0    7. Colophony 0    8. Paraben Mix 0    9. Negative Control 0    10. Balsam of [place] 0    11. Ethylenediamine Dihydrochloride 0    12. Cobalt Dichloride 0      Panel 2   13. p-tert Butylphenol Formaldehyde Resin 0    14. Epoxy Resin 0    15. Carba Mix 0    16.  Black Rubber Mix 0    17. Cl+ Me-Isothiazolinone 0    18. Quaternium-15 0    19. Methyldibromo Glutaronitrile 0    20. p-Phenylenediamine 0    21. Formaldehyde 0    22. Mercapto Mix 0    23. Thimerosal 0    24. Thiuram Mix 0      Panel 3   25. Diazolidinyl Urea 0    26. Quinoline Mix 0    27. Tixocortol-21-Pivalate 0    28. Gold Sodium Thiosulfate 2    29. Imidazolidinyl Urea 0    30. Budesonide 0    31. Hydrocortisone-17-Butyrate 0    32. Mercaptobenzothiazole 0    33. Bacitracin 0    34. Parthenolide 0    35. Disperse Blue 106 0    36. 2-Bromo-2-Nitropropane-1,3-diol 0              Assessment and Plan: Kelly Abbott is a 68 y.o. female with: Concern  for Contact Dermatitis:  The patient has been provided detailed information regarding the substances she is sensitive to, as well as products containing the substances.  Meticulous avoidance of these substances is recommended. If avoidance is not possible, the use of barrier creams or lotions is recommended. If symptoms persist or progress despite meticulous avoidance of chemicals/substances above, dermatology evaluation may be warranted. No follow-ups on file.  It was my pleasure to see Kelly Abbott today and participate in her care. Please feel free to contact me with any questions or concerns.  Sincerely,   Ferol Luz, MD [facility] [place]

## 2023-04-30 ENCOUNTER — Other Ambulatory Visit: Payer: Self-pay | Admitting: Internal Medicine

## 2023-06-25 DIAGNOSIS — J4521 Mild intermittent asthma with (acute) exacerbation: Secondary | ICD-10-CM | POA: Diagnosis not present

## 2023-06-25 DIAGNOSIS — F321 Major depressive disorder, single episode, moderate: Secondary | ICD-10-CM | POA: Diagnosis not present

## 2023-06-25 DIAGNOSIS — R030 Elevated blood-pressure reading, without diagnosis of hypertension: Secondary | ICD-10-CM | POA: Diagnosis not present

## 2023-06-25 DIAGNOSIS — Z6839 Body mass index (BMI) 39.0-39.9, adult: Secondary | ICD-10-CM | POA: Diagnosis not present

## 2023-07-30 DIAGNOSIS — R7301 Impaired fasting glucose: Secondary | ICD-10-CM | POA: Diagnosis not present

## 2023-07-30 DIAGNOSIS — Z1331 Encounter for screening for depression: Secondary | ICD-10-CM | POA: Diagnosis not present

## 2023-07-30 DIAGNOSIS — Z8249 Family history of ischemic heart disease and other diseases of the circulatory system: Secondary | ICD-10-CM | POA: Diagnosis not present

## 2023-07-30 DIAGNOSIS — R0683 Snoring: Secondary | ICD-10-CM | POA: Diagnosis not present

## 2023-07-30 DIAGNOSIS — E782 Mixed hyperlipidemia: Secondary | ICD-10-CM | POA: Diagnosis not present

## 2023-07-30 DIAGNOSIS — E66812 Obesity, class 2: Secondary | ICD-10-CM | POA: Diagnosis not present

## 2023-07-30 DIAGNOSIS — G4719 Other hypersomnia: Secondary | ICD-10-CM | POA: Diagnosis not present

## 2023-07-30 DIAGNOSIS — J45901 Unspecified asthma with (acute) exacerbation: Secondary | ICD-10-CM | POA: Diagnosis not present

## 2023-07-30 DIAGNOSIS — E6609 Other obesity due to excess calories: Secondary | ICD-10-CM | POA: Diagnosis not present

## 2023-08-08 DIAGNOSIS — R0683 Snoring: Secondary | ICD-10-CM | POA: Diagnosis not present

## 2023-08-08 DIAGNOSIS — G4719 Other hypersomnia: Secondary | ICD-10-CM | POA: Diagnosis not present

## 2023-08-12 DIAGNOSIS — H1013 Acute atopic conjunctivitis, bilateral: Secondary | ICD-10-CM | POA: Diagnosis not present

## 2023-08-14 DIAGNOSIS — Z9189 Other specified personal risk factors, not elsewhere classified: Secondary | ICD-10-CM | POA: Diagnosis not present

## 2023-08-14 DIAGNOSIS — E782 Mixed hyperlipidemia: Secondary | ICD-10-CM | POA: Diagnosis not present

## 2023-08-14 DIAGNOSIS — J45901 Unspecified asthma with (acute) exacerbation: Secondary | ICD-10-CM | POA: Diagnosis not present

## 2023-08-14 DIAGNOSIS — R7303 Prediabetes: Secondary | ICD-10-CM | POA: Diagnosis not present

## 2023-08-14 DIAGNOSIS — R948 Abnormal results of function studies of other organs and systems: Secondary | ICD-10-CM | POA: Diagnosis not present

## 2023-08-14 DIAGNOSIS — E66812 Obesity, class 2: Secondary | ICD-10-CM | POA: Diagnosis not present

## 2023-08-14 DIAGNOSIS — Z6838 Body mass index (BMI) 38.0-38.9, adult: Secondary | ICD-10-CM | POA: Diagnosis not present

## 2023-08-14 DIAGNOSIS — Z79899 Other long term (current) drug therapy: Secondary | ICD-10-CM | POA: Diagnosis not present

## 2023-08-14 DIAGNOSIS — E6609 Other obesity due to excess calories: Secondary | ICD-10-CM | POA: Diagnosis not present

## 2023-08-14 DIAGNOSIS — Z8249 Family history of ischemic heart disease and other diseases of the circulatory system: Secondary | ICD-10-CM | POA: Diagnosis not present

## 2023-08-16 DIAGNOSIS — G4733 Obstructive sleep apnea (adult) (pediatric): Secondary | ICD-10-CM | POA: Diagnosis not present

## 2023-08-20 DIAGNOSIS — G4733 Obstructive sleep apnea (adult) (pediatric): Secondary | ICD-10-CM | POA: Diagnosis not present

## 2023-08-29 ENCOUNTER — Telehealth: Payer: Self-pay | Admitting: Pharmacist

## 2023-08-29 NOTE — Progress Notes (Signed)
   08/29/2023  Patient ID: Kelly Abbott, female   DOB: 13-Nov-1955, 68 y.o.   MRN: [id number]  Patient appeared on insurance report for at-risk for failing 2025 metric: Medication Adherence for Cholesterol (MAC)   Medication: Atorvastatin 20mg  Last fill date: 2/25 30DS  Med was discontinued in April 2025 due to patient not taking it.   Patient will fail the 2025 adherence metric for Atorvastatin due to 2 fills for the year.    Aloysius Lewis, PharmD [facility]  Phone Number: [phone]

## 2023-09-02 DIAGNOSIS — Z01419 Encounter for gynecological examination (general) (routine) without abnormal findings: Secondary | ICD-10-CM | POA: Diagnosis not present

## 2023-09-02 DIAGNOSIS — Z1211 Encounter for screening for malignant neoplasm of colon: Secondary | ICD-10-CM | POA: Diagnosis not present

## 2023-09-02 DIAGNOSIS — Z1231 Encounter for screening mammogram for malignant neoplasm of breast: Secondary | ICD-10-CM | POA: Diagnosis not present

## 2023-09-03 DIAGNOSIS — G4733 Obstructive sleep apnea (adult) (pediatric): Secondary | ICD-10-CM | POA: Diagnosis not present

## 2023-11-22 DIAGNOSIS — R7303 Prediabetes: Secondary | ICD-10-CM | POA: Diagnosis not present

## 2023-11-22 DIAGNOSIS — Z0000 Encounter for general adult medical examination without abnormal findings: Secondary | ICD-10-CM | POA: Diagnosis not present

## 2023-11-22 DIAGNOSIS — E785 Hyperlipidemia, unspecified: Secondary | ICD-10-CM | POA: Diagnosis not present

## 2023-11-22 DIAGNOSIS — Z23 Encounter for immunization: Secondary | ICD-10-CM | POA: Diagnosis not present

## 2023-11-22 DIAGNOSIS — Z6838 Body mass index (BMI) 38.0-38.9, adult: Secondary | ICD-10-CM | POA: Diagnosis not present

## 2023-11-22 DIAGNOSIS — E782 Mixed hyperlipidemia: Secondary | ICD-10-CM | POA: Diagnosis not present

## 2023-11-22 DIAGNOSIS — Z8249 Family history of ischemic heart disease and other diseases of the circulatory system: Secondary | ICD-10-CM | POA: Diagnosis not present
